# Patient Record
Sex: Male | Born: 1960 | Race: White | Hispanic: No | Marital: Single
Health system: Midwestern US, Academic
[De-identification: ages and names within clinical notes are randomized; demographics above are authoritative.]

## PROBLEM LIST (undated history)

## (undated) DIAGNOSIS — E119 Type 2 diabetes mellitus without complications: Secondary | ICD-10-CM

## (undated) DIAGNOSIS — C801 Malignant (primary) neoplasm, unspecified: Secondary | ICD-10-CM

## (undated) DIAGNOSIS — M2420 Disorder of ligament, unspecified site: Secondary | ICD-10-CM

## (undated) DIAGNOSIS — M629 Disorder of muscle, unspecified: Secondary | ICD-10-CM

## (undated) DIAGNOSIS — N289 Disorder of kidney and ureter, unspecified: Secondary | ICD-10-CM

## (undated) DIAGNOSIS — S8990XA Unspecified injury of unspecified lower leg, initial encounter: Secondary | ICD-10-CM

## (undated) DIAGNOSIS — E785 Hyperlipidemia, unspecified: Secondary | ICD-10-CM

## (undated) DIAGNOSIS — J984 Other disorders of lung: Secondary | ICD-10-CM

## (undated) DIAGNOSIS — T148XXA Other injury of unspecified body region, initial encounter: Secondary | ICD-10-CM

## (undated) DIAGNOSIS — Z87828 Personal history of other (healed) physical injury and trauma: Secondary | ICD-10-CM

## (undated) DIAGNOSIS — I10 Essential (primary) hypertension: Secondary | ICD-10-CM

## (undated) DIAGNOSIS — M259 Joint disorder, unspecified: Secondary | ICD-10-CM

## (undated) HISTORY — PX: PR CHOLECYSTECTOMY: 47600

## (undated) HISTORY — PX: HERNIA REPAIR: SHX5222

## (undated) HISTORY — DX: Other disorders of lung: J98.4

## (undated) HISTORY — DX: Hyperlipidemia, unspecified: E78.5

## (undated) HISTORY — DX: Type 2 diabetes mellitus without complications: E11.9

## (undated) HISTORY — DX: Joint disorder, unspecified: M25.9

## (undated) HISTORY — PX: PR RPR INGUN HERNIA SLIDING ANY AGE: 49525

## (undated) HISTORY — PX: BACK SURGERY: SHX5067

## (undated) HISTORY — DX: Essential (primary) hypertension: I10

## (undated) HISTORY — DX: Other injury of unspecified body region, initial encounter: T14.8XXA

## (undated) HISTORY — DX: Disorder of ligament, unspecified site: M24.20

## (undated) HISTORY — DX: Disorder of kidney and ureter, unspecified: N28.9

## (undated) HISTORY — PX: NO PRIOR SURGERIES: 100

## (undated) HISTORY — DX: Personal history of other (healed) physical injury and trauma: Z87.828

## (undated) HISTORY — DX: Disorder of muscle, unspecified: M62.9

## (undated) HISTORY — DX: Malignant (primary) neoplasm, unspecified: C80.1

## (undated) HISTORY — DX: Unspecified injury of unspecified lower leg, initial encounter: S89.90XA

## (undated) HISTORY — PX: SURGICAL HX OTHER: 99

## (undated) HISTORY — PX: PR UNLISTED PROCEDURE FEMUR/KNEE: 27599

## (undated) HISTORY — PX: HERNIA REPAIR: SHX51

## (undated) HISTORY — PX: FOOT SURGERY: SHX648

## (undated) MED ORDER — LOSARTAN POTASSIUM 50 MG OR TABS
ORAL_TABLET | ORAL | 0 refills | Status: AC
Start: 2023-06-24 — End: ?

## (undated) MED ORDER — NIFEDIPINE ER 30 MG OR TB24
60.0000 mg | EXTENDED_RELEASE_TABLET | Freq: Two times a day (BID) | ORAL | 0 refills | Status: AC
Start: 2023-10-12 — End: ?

## (undated) MED ORDER — METFORMIN HCL 1000 MG OR TABS
1000.0000 mg | ORAL_TABLET | Freq: Two times a day (BID) | ORAL | 0 refills | Status: AC
Start: 2023-06-24 — End: ?

## (undated) MED ORDER — NIFEDIPINE ER 30 MG OR TB24
EXTENDED_RELEASE_TABLET | ORAL | 0 refills | Status: AC
Start: 2023-10-07 — End: ?

## (undated) MED ORDER — METFORMIN HCL 1000 MG OR TABS
ORAL_TABLET | ORAL | 0 refills | Status: AC
Start: 2019-08-10 — End: ?

## (undated) MED ORDER — JARDIANCE 25 MG OR TABS
ORAL_TABLET | ORAL | 0 refills | Status: AC
Start: 2023-06-24 — End: ?

## (undated) MED ORDER — METOPROLOL SUCCINATE ER 25 MG OR TB24
EXTENDED_RELEASE_TABLET | ORAL | 0 refills | Status: AC
Start: 2023-06-24 — End: ?

---

## 1999-06-17 ENCOUNTER — Encounter: Payer: Self-pay | Admitting: Specialist

## 2011-12-14 ENCOUNTER — Encounter (INDEPENDENT_AMBULATORY_CARE_PROVIDER_SITE_OTHER): Payer: Self-pay | Admitting: Family Medicine

## 2011-12-14 ENCOUNTER — Ambulatory Visit (INDEPENDENT_AMBULATORY_CARE_PROVIDER_SITE_OTHER): Payer: No Typology Code available for payment source | Admitting: Family Medicine

## 2011-12-14 VITALS — BP 130/76 | HR 60 | Temp 98.2°F | Resp 14 | Wt 217.0 lb

## 2011-12-14 DIAGNOSIS — E1122 Type 2 diabetes mellitus with diabetic chronic kidney disease: Secondary | ICD-10-CM | POA: Insufficient documentation

## 2011-12-14 DIAGNOSIS — Z794 Long term (current) use of insulin: Secondary | ICD-10-CM | POA: Insufficient documentation

## 2011-12-14 DIAGNOSIS — K4090 Unilateral inguinal hernia, without obstruction or gangrene, not specified as recurrent: Secondary | ICD-10-CM

## 2011-12-14 NOTE — Progress Notes (Signed)
Why is patient here today? Patient is here for groin pain x 2 weeks.   Sharp pain. Has a hernia and painful when he pushes on it.

## 2011-12-14 NOTE — Progress Notes (Signed)
CC: left side groin pain x 2 weeks.  S:  Lifted a suitcase a couple weeks ago.  Can't remember when the pain started, but it did not start immediately.  Does seem to be getting bigger.    He is certain this is a hernia.Marland Kitchen    History of bilateral inguinal hernias repaired.    Past Medical History   Diagnosis Date   . Type II or unspecified type diabetes mellitus without mention of complication, not stated as uncontrolled      Patient Active Problem List   Diagnosis   . Type II or unspecified type diabetes mellitus without mention of complication, not stated as uncontrolled   . UNCLASSIFIED DIABETES, MANAGED ELSEWHERE     History     Social History   . Marital Status: Married     Spouse Name: N/A     Number of Children: N/A   . Years of Education: N/A     Social History Main Topics   . Smoking status: Current Some Day Smoker   . Smokeless tobacco: Not on file    Comment: occasional cigar   . Alcohol Use: No   . Drug Use: No   . Sexually Active: Not on file     Other Topics Concern   . Not on file     Social History Narrative   . No narrative on file         O: BP 130/76  Pulse 60  Temp 98.2 F (36.8 C) (Temporal)  Resp 14  Wt 217 lb (98.431 kg)  Gen: no acute distress  HEENT: conjuc/sclerae clear bilaterally.  OP clear, no erythema or lesions noted.  Neck: no lymphadenopathy, no palpable thyromegaly or nodules  CV: regular rate and rhythm, no murmurs, rubs, or gallops  Resp: CLEAR TO AUSCULTATION BILATERALLY, no wheezes, crackles, or rales  Abd: soft, nontender, nondistended, no palpable masses or hepatosplenomegaly  GU:  Palpable left inguinal hernia on valsalva.  It is fully reducible. Mild tenderness to palpation in the left inguinal regional.  Ext: warm, no edema  Skin: no rashes or concerning lesions noted.  Full skin exam not performed today.  PSYCHIATRIC:  Judgement/insight:  Normal, Mood/affect:  Normal., Orientation:  Normal.    A/P:  550.90 Left inguinal hernia  (primary encounter diagnosis)  Comment:  palpable.  Imaging not ordered today  Plan: REFERRAL TO GENERAL SURGERY            2500 UNCLASSIFIED DIABETES, MANAGED ELSEWHERE  Comment:   Plan: patient does not live her on a permanent basis.  His medications are managed by the [place]

## 2011-12-14 NOTE — Patient Instructions (Addendum)
Please make appointment with surgeon as soon as possible.    If symptoms worsen in the meantime, please call us at once.

## 2012-01-25 ENCOUNTER — Ambulatory Visit (INDEPENDENT_AMBULATORY_CARE_PROVIDER_SITE_OTHER): Payer: No Typology Code available for payment source | Admitting: Family Medicine

## 2012-01-25 ENCOUNTER — Encounter (INDEPENDENT_AMBULATORY_CARE_PROVIDER_SITE_OTHER): Payer: Self-pay | Admitting: Family Medicine

## 2012-01-25 VITALS — BP 145/92 | HR 68 | Temp 97.6°F | Resp 16 | Ht 72.24 in | Wt 214.0 lb

## 2012-01-25 DIAGNOSIS — I10 Essential (primary) hypertension: Secondary | ICD-10-CM

## 2012-01-25 DIAGNOSIS — M546 Pain in thoracic spine: Secondary | ICD-10-CM

## 2012-01-25 DIAGNOSIS — M549 Dorsalgia, unspecified: Secondary | ICD-10-CM

## 2012-01-25 MED ORDER — CYCLOBENZAPRINE HCL 10 MG OR TABS
ORAL_TABLET | ORAL | Status: DC
Start: 2012-01-25 — End: 2014-10-16

## 2012-01-25 NOTE — Progress Notes (Signed)
Why is patient here today? Pt is here and would like to discuss neck and left shoulder pain. Pt states that sx started a couple of week ago.     Any new significant medical diagnoses since last visit? NO    Refills? NO    Referral? NO    Letter or form? NO    Lab results? NO    Health maintenance issues? YES

## 2012-01-25 NOTE — Progress Notes (Signed)
HPI  Sean Oliver is a 51 year old male who presents with complaint of upper back pain on left side that radiates into the left shoulder and upper arm.  This has been a problem intermittently for at least a year.  He works as a Mudlogger.  The team trainers have been able to help him with these symptoms by massage.  He is asking for a massage referral at this time.  He would also like a muscle relaxant.  He denies headache, numbness or tingling in his hands.  He believes the symptoms are stress related.    When asked about his elevated blood pressure, he states he hasn't taken his medication in a few days.    Review of patient's allergies indicates:  No Known Allergies      History   Substance Use Topics   . Smoking status: Current Some Day Smoker   . Smokeless tobacco: Not on file    Comment: occasional cigar   . Alcohol Use: No       ROS:  See HPI      Physical Exam  BP 145/92  Pulse 68  Temp 97.6 F (36.4 C) (Temporal)  Resp 16  Ht 6' 0.24" (1.835 m)  Wt 214 lb (97.07 kg)  BMI 28.83 kg/m2  General:  alert, cooperative, no acute distress   Neck:  No cervical tenderness.  FROM.  Upper back:  Tender along the trapezius on the left.  No deformity noted.  Left shoulder:  FROM, non tender      Assesment/Plan  724.1 Upper back pain on left side  (primary encounter diagnosis)  Plan: Cyclobenzaprine HCl (FLEXERIL) 10 MG Oral Tab,         REFERRAL TO MASSAGE THERAPY        Follow-up if not improved    401.9 HTN (hypertension)  Plan: Encouraged to take medications.

## 2012-01-25 NOTE — Patient Instructions (Signed)
It was a pleasure to see you in clinic today. Your Medical Assistant was: Sara H.           You can schedule an appointment to see us by calling  [phone] or via eCare.     If labs were ordered today the results are expected to be available via eCare 5 days later. Otherwise, result letters are mailed 7-10 days after your tests are completed. If your physician needs to change your care based on your results, you will receive a phone call to notify you. If you haven't heard from him/her and it has been more than 10 days please give us a call.     Thank you for choosing [facility].

## 2012-03-15 ENCOUNTER — Ambulatory Visit (INDEPENDENT_AMBULATORY_CARE_PROVIDER_SITE_OTHER): Payer: No Typology Code available for payment source

## 2012-03-15 DIAGNOSIS — Z23 Encounter for immunization: Secondary | ICD-10-CM

## 2012-03-15 NOTE — Progress Notes (Signed)
Vaccine Screening Questions      1.  Have you had a serious reaction or an allergic reaction to a vaccine?  NO    2.  Currently have a moderate or severe illness, including fever? (Don't Ask if vaccine   ordered by provider same day)  NO    3.  Ever had a seizure or a brain or other nervous system problem syndrome associated with a vaccine? (DTaP/TDaP/DTP pertinent) NO    4.  Is patient receiving  any live vaccinations today? (Varicella-Chickenpox, MMR-Measles/Mumps/Rubella, Zoster-Shingles)  NO    If YES to any of the questions above - Do NOT give vaccine.  Consult with RN or provider in clinic.  (#4 can be YES if all Live vaccine questions are answered NO)    If NO to all questions above - Patient may receive vaccine.    5.  Do you need to receive the Flu vaccine today? YES - Additional Flu Questions  Flu Vaccine Screening Questions:    Ever had a serious allergic reaction to eggs?  NO    Ever had Guillain-Barre syndrome associated with a vaccine? NO    Less than 6 months old? NO    If YES to any of the Flu questions above - NO Flu Vaccine to be given.  Patient may consult provider as needed.    If NO to all questions above - Patient may receive Flu Shot (IM)    If between 6 months and 68 years of age, was flu vaccine received last year?  N/A  If NO to above question:   For the 2012-13 season, administer 2 doses (separated by at least 4 weeks) to children who are receiving influenza vaccine for the first time.    For the 2013-14 season, follow dosing guidelines in the 2013 ACIP influenza vaccine recommendations     Flu Mist (Nasal) may be considered if patient is >2 yrs old and < 42 yrs old.    Do you wish to proceed with screening questions for Flu Mist?  NO     Vaccine information sheet(s) discussed, patient/parent/guardian verbalized understanding? YES     VIS given 03/15/2012 by Glenna Fellows MA.      Immunization Documentation:    Patient/Parent has reviewed the appropriate VIS and has all questions answered?  YES    Vaccine(s) given today without initial adverse effect?     Sean Oliver, [place]

## 2012-03-25 ENCOUNTER — Ambulatory Visit (INDEPENDENT_AMBULATORY_CARE_PROVIDER_SITE_OTHER): Payer: No Typology Code available for payment source

## 2012-08-04 ENCOUNTER — Ambulatory Visit (INDEPENDENT_AMBULATORY_CARE_PROVIDER_SITE_OTHER): Payer: No Typology Code available for payment source | Admitting: Medical

## 2012-08-04 ENCOUNTER — Encounter (INDEPENDENT_AMBULATORY_CARE_PROVIDER_SITE_OTHER): Payer: Self-pay | Admitting: Medical

## 2012-08-04 VITALS — BP 136/82 | HR 56 | Temp 97.8°F | Resp 10 | Wt 210.0 lb

## 2012-08-04 DIAGNOSIS — R079 Chest pain, unspecified: Secondary | ICD-10-CM

## 2012-08-04 LAB — X-RAY CHEST 2 VW

## 2012-08-04 NOTE — Progress Notes (Signed)
S:  Sean Oliver is a 52 year old male who presents today for concerns about chest pain.    Chest pain when he breaths, lays down, and walks for the past 1.5 weeks.  Previously seen at [place] for pericarditis in the winter.    "It feels like before when I had pericarditis, I can feel fluid in my chest."  Pain is 8/10 and is present now  Also had pneumonia last year the same time he was diagnosed with pericarditis.    Has been feeling hot and cold, but no fevers at home.    Admits to shortness of breath, feels like he is breathing more shallow.    Chest pain and shortness of breath is worse when he is walking.    Also feels back pain across the top of his back "the same as last time."    States he doesn't have a PCP or a provider who is regularly managing his diabetes.    He has been seen in the [facility] previously for urgent MSK care but no other outside records are available today.    No prior records from [place] are available through [facility] [employer].    Denies headache.   Denies nausea,vomiting.  Denies abdominal pain.    Admits to pain when he touches his left upper back or sternal area but denies recent trauma.    ROS:  Constitutional: Admits to chills, fatigue, malaise.  Denies fevers  Eyes: Denies changes in vision, eye pain  Nose:  Denies rhinorrhea  Throat:  Denies sore throat, difficulty swallowing.    Cardiovascular: see above  Respiratory: see above Denies cough, wheezing  Gastrointestinal:Denies abdominal pain, nausea, vomiting, diarrhea, constipation  Musculoskeletal: Admits to pain to palpation of left upper back and sternum.  Denies other MSK pain.    Skin: No rashes, lesions, or wounds  Neurological: Denies headaches, tremor, or weakness      Patient Active Problem List   Diagnosis   . Type II or unspecified type diabetes mellitus without mention of complication, not stated as uncontrolled   . UNCLASSIFIED DIABETES, MANAGED ELSEWHERE       Outpatient Prescriptions Prior to Visit   Medication Sig  Dispense Refill   . Cyclobenzaprine HCl (FLEXERIL) 10 MG Oral Tab Take 1 tablet by mouth 3 times per day as needed for muscle pain  30 Tab  1   . Lisinopril 20 MG Oral Tab 1 TABLET DAILY       . MetFORMIN HCl 1000 MG Oral Tab once daily       . Omeprazole 20 MG Oral Tab EC once daily       . Triamterene-HCTZ 75-50 MG Oral Tab once and a half tablets daily         No facility-administered medications prior to visit.     Review of patient's allergies indicates:  No Known Allergies    Family History   Problem Relation Age of Onset   . Osteoporosis Mother    . Hypertension Father      No family status information on file.     OBJECTIVE:  BP 136/82  Pulse 56  Temp(Src) 97.8 F (36.6 C) (Temporal)  Resp 10  Wt 210 lb (95.255 kg)  BMI 28.29 kg/m2  SpO2 99%  GENERAL: Well developed, well nourished patient in no acute distress.  Alert and oriented x3.    HYDRATION:  Moist mucous membranes, good skin turgor.    SKIN:  No rashes or lesions noted.  HEAD:  Normocephalic, atraumatic.    EYES:  PERLA.  EOMI.  No conjuctival erythema or discharge.    NECK:  Supple, nontender.  No lymphadenopathy.    PULM:  Clear to auscultation bilaterally.  No wheezes, rales, or rhonchi.    CV:  RRR.  Nl S1 and S2 without murmurs rubs or gallops.    ABDOMEN:  Soft, nontender.  NABS x4.     MSK:  Generalized tenderness to palpation of left upper back and sternal area.  No ecchymoses or crepitus noted.    NEURO:  CN2-12 intact.  DTRS 2+ bilaterally.  5/5 motor strength bilaterally. Normal gait, stations and coordination.    EXTREMITIES:  No clubbing, cyanosis, or edema.  2+ distal pulses.  Good capillary refill.    Chest xray:  No films available for comparison.  Right atelectasis noted.    EKG:  No Q waves or ST changes.      ASSESSMENT/PLAN:    (786.50) Chest pain  (primary encounter diagnosis)  Chest pain, worse on exertion, in a 52 year old male with a history of poorly controlled diabetes.  Subjectively similar to patient's prior  episode of pericarditis.  We discussed the need for further evaluation today including cardiac enzymes and an echocardiogram and that this evaluation needed to be done in the ER. I recommended that he be transported by ambulance to [place] because this is where he was seen for his prior episode.  He refused ambulance transport and said he would go by private car to the ER.  I called and spoke with the ER physician at [facility].    Plan: ECG ROUTINE ECG W/LEAST 12 LDS TRCG ONLY W/O         I&R, X-RAY CHEST 2 VW, REFERRAL TO ER/URGENT         CARE    Dx, Tx, risks and alternatives discussed with patient and questions answered. Return to clinic if symptoms increase, persist, or worsen.    Face to face consultation regarding HPI, exam, review of differential diagnoses, rationale for decision-making and use of medications including answering all questions and concerns, lasting 35 min with >= 50% of the time spent counseling on above issues.    Note:  During the course of our visit he became angry and expressed frustration that his records from [place] were not available and that we did not have the ability to perform an echocardiogram among other complaints.

## 2014-01-29 ENCOUNTER — Encounter (INDEPENDENT_AMBULATORY_CARE_PROVIDER_SITE_OTHER): Payer: No Typology Code available for payment source | Admitting: Adult Health

## 2014-02-05 ENCOUNTER — Ambulatory Visit (INDEPENDENT_AMBULATORY_CARE_PROVIDER_SITE_OTHER): Payer: No Typology Code available for payment source | Admitting: Family Medicine

## 2014-02-05 ENCOUNTER — Encounter (INDEPENDENT_AMBULATORY_CARE_PROVIDER_SITE_OTHER): Payer: Self-pay | Admitting: Family Medicine

## 2014-02-05 VITALS — BP 127/87 | HR 64 | Temp 98.7°F | Resp 16 | Wt 205.0 lb

## 2014-02-05 DIAGNOSIS — N529 Male erectile dysfunction, unspecified: Secondary | ICD-10-CM

## 2014-02-05 DIAGNOSIS — E119 Type 2 diabetes mellitus without complications: Secondary | ICD-10-CM

## 2014-02-05 LAB — BASIC METABOLIC PANEL
Anion Gap: 5 (ref 4–12)
Calcium: 10 mg/dL (ref 8.9–10.2)
Carbon Dioxide, Total: 31 mEq/L (ref 22–32)
Chloride: 103 mEq/L (ref 98–108)
Creatinine: 1.46 mg/dL — ABNORMAL HIGH (ref 0.51–1.18)
GFR, Calc, African American: >60 mL/min (ref >59)
GFR, Calc, European American: 51 mL/min — ABNORMAL LOW (ref >59)
Glucose: 107 mg/dL (ref 62–125)
Potassium: 3.9 mEq/L (ref 3.6–5.2)
Sodium: 139 mEq/L (ref 135–145)
Urea Nitrogen: 15 mg/dL (ref 8–21)

## 2014-02-05 LAB — LIPID PANEL
Cholesterol (LDL): 86 mg/dL (ref <130)
Cholesterol/HDL Ratio: 5.6
HDL Cholesterol: 29 mg/dL — ABNORMAL LOW (ref >39)
Non-HDL Cholesterol: 134 mg/dL (ref 0–159)
Total Cholesterol: 163 mg/dL (ref <200)
Triglyceride: 241 mg/dL — ABNORMAL HIGH (ref <150)

## 2014-02-05 LAB — PR A1C RAPID, ONSITE: Hemoglobin A1C: 5.8 % (ref 4.0–6.0)

## 2014-02-05 MED ORDER — SILDENAFIL CITRATE 100 MG OR TABS
100.0000 mg | ORAL_TABLET | Freq: Every day | ORAL | Status: DC | PRN
Start: 2014-02-05 — End: 2014-10-16

## 2014-02-05 NOTE — Progress Notes (Signed)
I have personally discussed the case with the resident during or immediately after the patient visit including review of history, physical exam, diagnosis, and treatment plan. 52y with DM, HTN here for refill viagra and reestablish care. Baseline labs for now, needs wellness exam soon.

## 2014-02-05 NOTE — Progress Notes (Signed)
CHIEF COMPLAINT:   Requesting refill of sildenafil    HISTORY OF PRESENT ILLNESS: Sean Oliver is a 53 year old gentleman with history of T2DM presenting to clinic for medication refill. This is my first time seeing this patient and he also reports the following issues:     1. Pain of left hip: About a week ago, felt a sudden pain in his left groin/hip area. Says pain started after stepping on to a curb, describes it "sharp, burning". Pain subsided after 1-2 days, after taking it easy, though he did do some walking around [place].   - Has had similar pain in the past, while playing basketball or while standing up from sitting position. Never had pain like this on the right side.   - Has been taking ibuprofen with adequate relief   - Has had bilateral inguinal hernias, which were repaired with mesh. Says this pain is very different than hernia pain.     2. H/o T2DM:   - Reports history of poorly controlled diabetes, wants to be more proactive in diabetes management   - Leads a physically active lifestyle, as he is a basketball coach, but says he could eat healthier than he does now  - Says he has been seen by a nephrologist in the past for kidney disease, unsure of any further details     3. Erectile dysfunction:   - Requesting medication refill  - Says he has good effect with medication; difficulty without medication  - Denies any episodes of lightheadedness, low blood pressure, chest pain/SOB    4. History of pericarditis:    Has had episodes of pericarditis in the past but says last episode was approximately two years ago. No CP, SOB, palpitations.     SOCIAL HISTORY:   Lives in [place]. Coaches basketball. Smokes cigars 1-2 times a month. Does not smoke cigarettes.     PHYSICAL EXAM:  BP 127/87 mmHg  Pulse 64  Temp(Src) 98.7 F (37.1 C) (Temporal)  Resp 16  Wt 205 lb (92.987 kg)  SpO2 97%  GEN: Pleasant, well-appearing tall African-American gentleman sitting in chair in NAD  HEENT: PERRL, MMM  CVS:  RRR, normal S1/S2, no m/r/g  LUNGS: CTAB wo rales, rhonchi, wheezes  MSK: Full ROM in all extremities     ASSESSMENT/PLAN:   Sean Oliver is a 53 y/o gentleman with h/o T2DM and erectile dysfunction who presents to clinic for medication refill and to establish care with me as his new PCP. Doing well overall, no acute concerns.     - Provide medication refill for sildenafil   - Lipid panel, BMP, HgbA1C today   - Plan to return in next 1-2 months for annual wellness exam and further discussion of diabetes management

## 2014-02-05 NOTE — Patient Instructions (Signed)
Please talk to your wife and sign a release of information form here at the clinic providing the contact information (name, location) of the clinics you have been seen at in the recent years (previous primary care clinic, kidney doctor).    Ask for a release of information form at the front desk.

## 2014-02-08 ENCOUNTER — Telehealth (INDEPENDENT_AMBULATORY_CARE_PROVIDER_SITE_OTHER): Payer: Self-pay | Admitting: Family Medicine

## 2014-02-08 NOTE — Telephone Encounter (Signed)
PA is needed for sidenafil citrate (Viagra) Rx.  Chart notes are incomplete from the 08.24.15 OV.  Dr. Marjo Bicker, please let me know when they are completed.  Reminder, we need good documentation for the reason for the Viagra if you wish to pursue a PA for medication.  Thank you.

## 2014-02-14 NOTE — Telephone Encounter (Signed)
Routing to POD 3

## 2014-02-14 NOTE — Telephone Encounter (Signed)
Pt's wife stopped into clinic today regarding issue below,    Saying that pt was denied Rx from Dr Marjo Bicker, but she applied through pharmaceutical company to obtain medication   -they gave her a packet to fill out, a section of which needs to be filled out by provider, based on last visit with pt   -then when complete dr Marjo Bicker should give a new Rx   -forms placed in manilla envelope, post it with name, N#, and phone of pt included, envelope placed in blue team box, pod3    When forms are complete, please fax to company their fax on form, fax: [phone]    Also call patient / wife when complete their ph: [phone]    KEEP FORMS, even if faxed, patient and wife need them back    Please advise, thanks

## 2014-02-15 NOTE — Telephone Encounter (Signed)
Let spouse know that Dr. Marjo Bicker is not in clinic until 09.14.15.  I will check if another provider can complete the form but did let her know that it may need to wait for Dr. Marjo Bicker.    Form is in Dr. Herma Ard inbox for completion.

## 2014-03-05 NOTE — Progress Notes (Signed)
I have reviewed the resident's documentation and agree with it.

## 2014-03-06 NOTE — Telephone Encounter (Signed)
Completed form and placed in box to be scanned, may be left at the front desk to be handed back to patient. I also spoke with the patient over the phone on Thursday 9/18 regarding his recent test results and let him know that the paperwork is ready and available at the clinic. Thank you.

## 2014-05-07 ENCOUNTER — Telehealth (INDEPENDENT_AMBULATORY_CARE_PROVIDER_SITE_OTHER): Payer: Self-pay | Admitting: Family Medicine

## 2014-05-07 NOTE — Telephone Encounter (Signed)
Called and spoke with Sean Oliver, informed that I went ahead and re-faxed forms to fax number provided and was on form.  Fax confirmation received.  Told her that original form placed in pick up drawer for their records and can pick up whenever

## 2014-05-07 NOTE — Telephone Encounter (Signed)
CONFIRMED PHONE NUMBER: [phone]  CALLERS FIRST AND LAST NAME: [name]  FACILITY NAME: na TITLE: na  CALLERS RELATIONSHIP:Self  RETURN CALL: Detailed message on voicemail only     SUBJECT: General Message   REASON FOR REQUEST: prescription form dropped off on 02/08/14 never got faxed to pharmaceutical company.     MESSAGE: please see TE dated 02/08/14 for more information. Patient's wife would like an update to her request.

## 2014-05-07 NOTE — Telephone Encounter (Signed)
Patient's spouse returning call from [place]. Please contact patient's spouse back back as soon as possible for fax clarifications. Thank you!

## 2014-05-22 ENCOUNTER — Telehealth (INDEPENDENT_AMBULATORY_CARE_PROVIDER_SITE_OTHER): Payer: Self-pay | Admitting: Adult Health

## 2014-05-22 NOTE — Telephone Encounter (Signed)
CONFIRMED PHONE NUMBER: [phone]  CALLERS FIRST AND LAST NAME: Asencion Partridge  FACILITY NAME: na TITLE: na  CALLERS RELATIONSHIP:Spouse  RETURN CALL: General message OK     SUBJECT: Letter/Form Request   REASON FOR REQUEST: Please refer to TE from 05/07/14 regarding forms. Patients wife states that the company still has not received the paperwork and would like the clinic to try a different fax number this time [place]: Other: Please refer to TE from 05/07/14  [facility] SHOULD FILL IT OUT: Other: Please refer to TE from 05/07/14  WHERE SHOULD IT BE SENT: Other: Please refer to TE from 05/07/14  DATE NEEDED BY: Other: Please refer to TE from 05/07/14

## 2014-05-22 NOTE — Telephone Encounter (Signed)
Called Sean Oliver ( Pt's wife ) to inform about successful fax 05/07/2014 and Sean Oliver requested documents be faxed to a provided fax number ( [phone]) which has been done 05/21/2014 (2:11 pm) . Sean Oliver requested a call back when confirmation received. Fax was sent successfully and Sean Oliver received call back to be informed.

## 2014-05-22 NOTE — Telephone Encounter (Signed)
Message forwarded to Pod 3    Spoke with Patient would like to get a call back today.

## 2014-05-29 ENCOUNTER — Telehealth (INDEPENDENT_AMBULATORY_CARE_PROVIDER_SITE_OTHER): Payer: Self-pay | Admitting: Adult Health

## 2014-05-29 NOTE — Telephone Encounter (Signed)
Patient spouse is returning the clinic call, please call [phone] ASAP and a detail VM is ok. Please advise, thank you.

## 2014-05-29 NOTE — Telephone Encounter (Signed)
CONFIRMED PHONE NUMBER: [phone]  CALLERS FIRST AND LAST NAME: [name]  FACILITY NAME: na TITLE: na  CALLERS RELATIONSHIP:Spouse  RETURN CALL: Detailed message on voicemail only     SUBJECT: General Message   REASON FOR REQUEST: Forms    MESSAGE: Patient's spouse is calling regarding forms to be provided to a pharmaceutical company so the patient can receive free medications. Caller states the prescriber section of the form is incomplete. Caller states that in the prescriber section under the prescription order information the provider needs to put the product name, the strength, and the directions. Please contact the caller to advise of the status on the request. Thank you!

## 2014-05-29 NOTE — Telephone Encounter (Signed)
Called and spoke with Sean Oliver, informed missing information filled in.   Faxing to ( [phone])   Fax confirmation received

## 2014-05-29 NOTE — Telephone Encounter (Signed)
Message forwarded to Pod 3      Please see previous closed TE's regarding forms

## 2014-06-22 ENCOUNTER — Telehealth (INDEPENDENT_AMBULATORY_CARE_PROVIDER_SITE_OTHER): Payer: Self-pay | Admitting: Family Medicine

## 2014-06-22 NOTE — Telephone Encounter (Signed)
[place] patient's wife and informed medications were delivered here.  We normally don't keep medications on site, but will do for one-time only which I relayed to patient. She will contact pharmaceutical to mail to either pharmacy or home.    Informed medications are in FD pick up drawer

## 2014-06-22 NOTE — Telephone Encounter (Signed)
CONFIRMED PHONE NUMBER: [phone]  CALLERS FIRST AND LAST NAME: [name]   FACILITY NAME: na TITLE: na  CALLERS RELATIONSHIP:Spouse  RETURN CALL: Detailed message on voicemail only     SUBJECT: General Message   REASON FOR REQUEST: Patient is getting prescriptions sent to clinic from [place] and wants to know what happens to prescriptions.      MESSAGE: Call and assist where medication goes.

## 2014-10-15 ENCOUNTER — Telehealth (INDEPENDENT_AMBULATORY_CARE_PROVIDER_SITE_OTHER): Payer: Self-pay | Admitting: Family Medicine

## 2014-10-15 DIAGNOSIS — N529 Male erectile dysfunction, unspecified: Secondary | ICD-10-CM

## 2014-10-15 NOTE — Telephone Encounter (Signed)
CONFIRMED PHONE NUMBER: [phone]  CALLERS FIRST AND LAST NAME: [name]  FACILITY NAME: naTITLE: na  CALLERS RELATIONSHIP:Spouse  RETURN CALL: Detailed message on voicemail only     ([place]) Texting is an option for this clinic. If you would like [place] to use this option, which mobile phone number should we text to if we are unable to reach you?    SUBJECT: Medication Management   REASON FOR REQUEST: Medication Management    MEDICATION(S): Sildenafil Citrate (VIAGRA) 100 MG Oral Tab  MEDICATION(S)  NEEDED BY: as soon as possible  ADDITIONAL INFORMATION: [employer] is requiring clinic to send prescription to [facility] in order for patient to obtain medication. Patient has less than 2 days left of medication. Please contact [employer] at [phone] ok to speak with anyone regarding this. Please with Asencion Partridge as soon as possible, thank you.

## 2014-10-15 NOTE — Telephone Encounter (Signed)
Routing to Dr Marjo Bicker Covering provider    Last OV- 02/05/14  Last prescribed-02/05/14    Rx and pharmacy pended for approval

## 2014-10-16 ENCOUNTER — Encounter (INDEPENDENT_AMBULATORY_CARE_PROVIDER_SITE_OTHER): Payer: Self-pay | Admitting: Family Medicine

## 2014-10-16 ENCOUNTER — Ambulatory Visit (INDEPENDENT_AMBULATORY_CARE_PROVIDER_SITE_OTHER): Payer: No Typology Code available for payment source | Admitting: Family Medicine

## 2014-10-16 VITALS — BP 138/82 | HR 59 | Temp 97.8°F | Resp 12 | Wt 214.1 lb

## 2014-10-16 DIAGNOSIS — N529 Male erectile dysfunction, unspecified: Secondary | ICD-10-CM

## 2014-10-16 DIAGNOSIS — E119 Type 2 diabetes mellitus without complications: Secondary | ICD-10-CM

## 2014-10-16 DIAGNOSIS — R229 Localized swelling, mass and lump, unspecified: Secondary | ICD-10-CM

## 2014-10-16 MED ORDER — SILDENAFIL CITRATE 100 MG OR TABS
100.0000 mg | ORAL_TABLET | Freq: Every day | ORAL | Status: DC | PRN
Start: 2014-10-16 — End: 2017-05-01

## 2014-10-16 NOTE — Telephone Encounter (Signed)
Handled by Dr. Mariel Sleet in [place].

## 2014-10-16 NOTE — Progress Notes (Signed)
SUBJECTIVE    Sean Oliver is a 54 year old male who presents today with the following issue(s):    Patient is here today following up on multiple medical problems, including skin lesion on his left knee, erectile dysfunction and cough.    He states he has had a skin lesion on his left knee that has been there for more than a year.  He states he was seen before and referred to surgery, but was never contacted.  He has been applying over-the-counter wart remover which seems to shrink it, but it never resolves.  He has had cryotherapy done as well which doesn't seem to work either.  No pain.  It continues to grow.    He lives part of his time in [place].  He is going to be leaving at the end of the month again.    He needs a refill of Viagra sent to his pharmacy.  He has a coupon for this that he gets through the company.    He has also had a mild cough that seems to be improving.  His allergies were acting up and maybe he had a cold.      Patient Active Problem List   Diagnosis   . Type II or unspecified type diabetes mellitus without mention of complication, not stated as uncontrolled   . UNCLASSIFIED DIABETES, MANAGED ELSEWHERE       OBJECTIVE    BP 138/82 mmHg  Pulse 59  Temp(Src) 97.8 F (36.6 C) (Temporal)  Resp 12  Wt 214 lb 2 oz (97.126 kg)  SpO2 100%  GEN:  Alert, pleasant, NAD  PULM:  CTA bilaterally, good effort  CV:  RRR without murmur appreciated, normal PMI  SKIN:  Left knee with 1.5 cm hypertrophic flesh-toned cutaneous horn that projects about 8 mm above the surface of the skin        A/P    (R22.9) Skin nodule  (primary encounter diagnosis)  Plan: This doesn't look like a wart to me.  Concerning for possible skin malignancy.  Dr. Marjo Bicker is out for the month, so will schedule an appointment with me this week for excisional biopsy.    (N52.9) Erectile dysfunction, unspecified erectile dysfunction type  Plan: Sildenafil Citrate (VIAGRA) 100 MG Oral Tab        Refilled.    (E11.9) Type 2 diabetes  mellitus without complication  Plan: D6L RAPID, ONSITE, COMPREHENSIVE METABOLIC         PANEL, LIPID PANEL, URINE SCREEN,         MICROALBUMINURIA        Patient is due for labs, so will get these done at next visit.

## 2014-10-16 NOTE — Progress Notes (Signed)
Reason for Visit: wart L knee    Refills? YES  Referral? NO  Letter or Form? NO  Lab Results? NO    HEALTH MAINTENANCE:  Has the patient had this done since their last visit?  Cervical screening/PAP: N/A  Mammo: N/A  Colon Screen: Will take a FIT test    Have you seen a specialist since your last visit: No    Vaccines Due? No    HM Due:   Health Maintenance   Topic Date Due   . Hepatitis C Screen  Sep 03, 1960   . HIV Screen  10-05-1962   . Tetanus Vaccine  02/26/1975   . Diabetes Foot Exam  02/25/1981   . Diabetes Eye Exam  02/25/1981   . Colon Cancer Screen w/ FOBT/FIT  02/25/2013   . Cholesterol Test  02/06/2019   . Influenza Vaccine  Completed

## 2014-10-18 ENCOUNTER — Other Ambulatory Visit
Admit: 2014-10-18 | Discharge: 2014-10-18 | Disposition: A | Discharge status: Home / Self Care | Payer: No Typology Code available for payment source | Attending: Family Medicine | Admitting: Family Medicine

## 2014-10-18 ENCOUNTER — Ambulatory Visit (INDEPENDENT_AMBULATORY_CARE_PROVIDER_SITE_OTHER): Payer: No Typology Code available for payment source | Admitting: Family Medicine

## 2014-10-18 ENCOUNTER — Encounter (INDEPENDENT_AMBULATORY_CARE_PROVIDER_SITE_OTHER): Payer: Self-pay | Admitting: Family Medicine

## 2014-10-18 VITALS — BP 150/99 | HR 67 | Temp 98.5°F | Resp 20

## 2014-10-18 DIAGNOSIS — C44729 Squamous cell carcinoma of skin of left lower limb, including hip: Secondary | ICD-10-CM

## 2014-10-18 DIAGNOSIS — R229 Localized swelling, mass and lump, unspecified: Secondary | ICD-10-CM

## 2014-10-18 DIAGNOSIS — E119 Type 2 diabetes mellitus without complications: Secondary | ICD-10-CM

## 2014-10-18 LAB — PR A1C RAPID, ONSITE: Hemoglobin A1C: 6.4 % — ABNORMAL HIGH (ref 4.0–6.0)

## 2014-10-18 NOTE — Patient Instructions (Signed)
Suture Care     Sutures (stitches) are used to close wounds. Sutures also help stop bleeding and speed healing. To help your wound heal, follow the tips on this handout.  Keep sutures clean   Avoid doing things that could cause dirt or sweat to get on your sutures. If necessary, cover your sutures with a dressing or bandageto protect them.   Don't pick at scabs. They help protect the wound.   Don't wash the area around your sutures unless your doctor says it's OK. Then, follow his or her instructions for washing and drying.    Keep sutures dry   Keep your sutures out of water.   To keep sutures dry when around water, cover them with a plastic bag. Take the plastic bag off when you are not around water. Youcan also use rubber gloves to protect sutures on a hand.   If sutures get damp, pat them dry.  Changing your dressing  Leave the dressing (bandage) in place until you are told to remove it or change it. Change it only as directed, using clean hands:   After the first ___hours, change your dressing every ___hours.   Change your dressing if it gets wet or soiled.  Other tips   To help wounds on an arm or leg heal, use the limb as little as possible.   To help reduce swelling and throbbing, raise the area with sutures above your heart.   To help prevent itching, cover sutures with gauze. If sutures itch, try not to scratch them.   For pain relief, try acetaminophen or ibuprofen. Don't use aspirin. It can increase bleeding.   [address] [employer] [place], [place] [address]. All rights reserved. This information is not intended as a substitute for professional medical care. Always follow your healthcare professional's instructions.

## 2014-10-18 NOTE — Progress Notes (Signed)
PROCEDURE NOTE, EXCISIONAL BIOPSY    Indication: left leg 1.5 cm hypertrophic flesh-toned cutaneous horn that projects about 8 mm above the surface of the skin    Location of lesion to be excised: as above    Size of the extracted lesion:   As above    History of allergy to iodine: NO     The risks (including bleeding and infection) and benefits of the procedure and written informed consent obtained.     Local anesthesia was performed with  Lidocaine 1% with epinephrine  without added sodium bicarbonate (total 8cc), prepped with povidone iodine, and draped in the usual sterile fashion.  A scalpel was used to excise an elliptical area of skin approximately 3cm by 2cm.  The wound was closed with 3-0 Nylon using simple interrupted stitches and a sterile dressing applied.  The specimen was sent for pathologic examination.     The patient tolerated the procedure well and without apparent complications.     The patient was instructed to keep the wound dry and covered for 24-48h and clean thereafter, and warning signs of infection were reviewed.  Will return for suture removal in 12 days.  Recommended that the patient use OTC acetaminophen and OTC ibuprofen as needed for pain.  Followup sooner for any concerns.                                                                                                                                                        [facility] Physician Documentation    1.  I was present for or participated in the entire procedure, including opening and closing.   YES

## 2014-10-19 LAB — COMPREHENSIVE METABOLIC PANEL
ALT (GPT): 12 U/L (ref 10–48)
AST (GOT): 15 U/L (ref 9–38)
Albumin: 4 g/dL (ref 3.5–5.2)
Alkaline Phosphatase (Total): 63 U/L (ref 39–139)
Anion Gap: 6 (ref 4–12)
Bilirubin (Total): 0.3 mg/dL (ref 0.2–1.3)
Calcium: 10 mg/dL (ref 8.9–10.2)
Carbon Dioxide, Total: 31 meq/L (ref 22–32)
Chloride: 103 meq/L (ref 98–108)
Creatinine: 1.43 mg/dL — ABNORMAL HIGH (ref 0.51–1.18)
GFR, Calc, African American: >60 mL/min (ref >59)
GFR, Calc, European American: 52 mL/min — ABNORMAL LOW (ref >59)
Glucose: 80 mg/dL (ref 62–125)
Potassium: 3.8 meq/L (ref 3.6–5.2)
Protein (Total): 7.1 g/dL (ref 6.0–8.2)
Sodium: 140 meq/L (ref 135–145)
Urea Nitrogen: 20 mg/dL (ref 8–21)

## 2014-10-19 LAB — LIPID PANEL
Cholesterol (LDL): 61 mg/dL (ref <130)
Cholesterol/HDL Ratio: 5.8
HDL Cholesterol: 29 mg/dL — ABNORMAL LOW (ref >39)
Non-HDL Cholesterol: 138 mg/dL (ref 0–159)
Total Cholesterol: 167 mg/dL (ref <200)
Triglyceride: 384 mg/dL — ABNORMAL HIGH (ref <150)

## 2014-10-19 LAB — ALBUMIN/CREATININE RATIO, RANDOM URINE
Albumin (Micro), URN: 27.46 mg/dL
Albumin/Creatinine Ratio, URN: 73 mg/g{creat} — ABNORMAL HIGH (ref <30)
Creatinine/Unit, URN: 377 mg/dL

## 2014-10-23 LAB — PATHOLOGY, SURGICAL

## 2014-10-30 ENCOUNTER — Ambulatory Visit (INDEPENDENT_AMBULATORY_CARE_PROVIDER_SITE_OTHER): Payer: No Typology Code available for payment source | Admitting: Family Medicine

## 2014-10-30 ENCOUNTER — Encounter (INDEPENDENT_AMBULATORY_CARE_PROVIDER_SITE_OTHER): Payer: Self-pay | Admitting: Family Medicine

## 2014-10-30 VITALS — BP 148/105 | HR 90 | Temp 97.4°F | Resp 10 | Wt 213.0 lb

## 2014-10-30 DIAGNOSIS — C44729 Squamous cell carcinoma of skin of left lower limb, including hip: Secondary | ICD-10-CM

## 2014-10-30 NOTE — Progress Notes (Signed)
Reason for Visit: stitches removed left leg    Refills? NO  Referral? NO  Letter or Form? NO  Lab Results? NO    HEALTH MAINTENANCE:  Has the patient had this done since their last visit?  Cervical screening/PAP: N/A  Mammo: N/A  Colon Screen: no    Have you seen a specialist since your last visit: No    Vaccines Due? no    HM Due:   Health Maintenance   Topic Date Due   . Hepatitis C Screen  1960/11/05   . HIV Screen  12-27-62   . Diabetes Foot Exam  02/25/1981   . Diabetes Eye Exam  02/25/1981   . Colon Cancer Screen w/ FOBT/FIT  02/25/2013   . Cholesterol Test  10/18/2019   . Tetanus Vaccine  02/07/2021   . Influenza Vaccine  Completed       PCP Verified?  Yes, Dr Marjo Bicker

## 2014-10-30 NOTE — Progress Notes (Signed)
SUBJECTIVE    Sean Oliver is a 54 year old male who presents today with the following issue(s):    Patient is here today for follow-up on excisional biopsy of left thigh nodule.  It is healing well.      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  Given information from Up To Date on non-melanoma skin cancer.  He will return for wide margin excision.

## 2014-10-31 NOTE — Addendum Note (Signed)
Addended by: Richardean Canal CID on: 10/31/2014 11:26 AM     Modules accepted: Orders

## 2014-11-08 ENCOUNTER — Other Ambulatory Visit
Admit: 2014-11-08 | Discharge: 2014-11-08 | Disposition: A | Discharge status: Home / Self Care | Payer: No Typology Code available for payment source | Attending: Family Medicine | Admitting: Family Medicine

## 2014-11-08 ENCOUNTER — Ambulatory Visit (INDEPENDENT_AMBULATORY_CARE_PROVIDER_SITE_OTHER): Payer: No Typology Code available for payment source | Admitting: Family Medicine

## 2014-11-08 ENCOUNTER — Encounter (INDEPENDENT_AMBULATORY_CARE_PROVIDER_SITE_OTHER): Payer: Self-pay | Admitting: Family Medicine

## 2014-11-08 VITALS — BP 143/98 | HR 54 | Temp 97.5°F | Resp 16 | Wt 219.0 lb

## 2014-11-08 DIAGNOSIS — C44729 Squamous cell carcinoma of skin of left lower limb, including hip: Secondary | ICD-10-CM

## 2014-11-08 MED ORDER — HYDROCODONE-ACETAMINOPHEN 5-325 MG OR TABS
1.0000 | ORAL_TABLET | ORAL | Status: DC | PRN
Start: 2014-11-08 — End: 2017-04-07

## 2014-11-08 MED ORDER — HYDROCODONE-ACETAMINOPHEN 5-325 MG OR TABS
1.0000 | ORAL_TABLET | ORAL | Status: DC | PRN
Start: 2014-11-08 — End: 2014-11-08

## 2014-11-08 NOTE — Progress Notes (Signed)
PROCEDURE NOTE, EXCISIONAL BIOPSY    Indication: wide margin excision for SCC    Location of lesion to be excised: left knee    Size of the extracted lesion:  1.5 cm    History of allergy to iodine: NO     The risks (including bleeding and infection) and benefits of the procedure and written informed consent obtained.     Local anesthesia was performed with  Lidocaine 1% with epinephrine (total 4cc), prepped with povidone iodine, and draped in the usual sterile fashion.  A scalpel was used to excise an elliptical area of skin approximately 4cm by 1cm.  The wound was closed with 3-0 Nylon using simple interrupted stitches and a sterile dressing applied.  The specimen was sent for pathologic examination.     The patient tolerated the procedure well and without apparent complications.     The patient was instructed to keep the wound dry and covered for 24-48h and clean thereafter, and warning signs of infection were reviewed.  Will return for suture removal in 10 days.  Recommended that the patient use Vicodin as needed for pain.  Followup sooner for any concerns.                                                                                                                                                        [facility] Physician Documentation    1.  I was present for or participated in the entire procedure, including opening and closing.   YES

## 2014-11-08 NOTE — Patient Instructions (Signed)
Suture Care  Stitches (sutures) are used to close wounds. Sutures also help stop bleeding and speed healing. To help your wound heal, follow the tips on this handout.  Some sutures need to be removed by a healthcare provider. Others dissolve on their own. Sometimes strips of tape are used. You'll be told what kind of sutures you have.   Keep sutures clean   Avoid doing things that could cause dirt or sweat to get on your sutures. If necessary, cover your sutures with a dressing or bandageto protect them.   Don't pick at scabs. They help protect the wound.   Don't wash the area around your sutures unless your doctor says it's OK. Then, follow his or her instructions for washing and drying.  Keep sutures dry   Keep your sutures out of water.   Take a sponge bath to avoid getting your sutures wound wet, unless your healthcare provider tells you otherwise.   Ask your provider when can you take a shower or bathe.   Ask your provider about the best way to keep your sutures dry when bathing or showering.   If sutures get damp, pat them dry.  Changing your dressing  Leave the dressing (bandage) in place until you are told to remove it or change it. Change it only as directed, using clean hands:   After the first ___hours, change your dressing every ___hours.   Change your dressing if it gets wet or soiled.  Other tips   To help wounds on an arm or leg heal, use the limb as little as possible.   To help reduce swelling and throbbing, raise the area with sutures above your heart.   To help prevent itching, cover sutures with gauze. If sutures itch, try not to scratch them.   For pain relief, try acetaminophen or ibuprofen. Don't use aspirin. It can increase bleeding.  When to seek medical care  Call your healthcare provider if you notice any of the following signs:   Increased soreness, pain, or tenderness after 24 hours   A red streak, increased redness, or puffiness near the wound   White, yellowish,  or bad smelling discharge from the wound   Bleeding that can't be stopped by applying pressure   Steri-Strips fall off or stitches dissolve before the wound heals   Fever over 100.1F (37.8C)    2000-2015 [employer] [address], [place] [address]. All rights reserved. This information is not intended as a substitute for professional medical care. Always follow your healthcare professional's instructions.

## 2014-11-13 LAB — PATHOLOGY, SURGICAL

## 2014-11-20 ENCOUNTER — Ambulatory Visit (INDEPENDENT_AMBULATORY_CARE_PROVIDER_SITE_OTHER): Payer: No Typology Code available for payment source

## 2014-11-20 ENCOUNTER — Telehealth (INDEPENDENT_AMBULATORY_CARE_PROVIDER_SITE_OTHER): Payer: Self-pay | Admitting: Family Medicine

## 2014-11-20 DIAGNOSIS — C44729 Squamous cell carcinoma of skin of left lower limb, including hip: Secondary | ICD-10-CM

## 2014-11-20 NOTE — Telephone Encounter (Signed)
Please let the patient know that the repeat excision was completely normal.  I have referred him to Dermatology to establish care for ongoing skin screening within the next few months.  No further treatment is needed at this time.  Thanks.

## 2014-11-20 NOTE — Telephone Encounter (Signed)
Patient stopped by the FD after his suture removal. He would like a call today to discuss the results from 5/26 with Dr. Mariel Sleet.     Routing to Pod 3.   Thank you!

## 2014-11-20 NOTE — Progress Notes (Signed)
Patient is here today with [facility] clinical staff for suture removal on  L knee.  Removed 6 stitches with no complications, Pt tolerates well with no reactions.  Pt also would like to know the pathology results sent by Dr. Mariel Sleet. Will send a TE to Dr. Mariel Sleet for response.

## 2014-11-20 NOTE — Telephone Encounter (Signed)
Routing to Dr Mariel Sleet- Please advise on Pathology result from 5/26. (I did not see a current letter or e-care)

## 2014-11-21 ENCOUNTER — Telehealth (INDEPENDENT_AMBULATORY_CARE_PROVIDER_SITE_OTHER): Payer: Self-pay | Admitting: Family Medicine

## 2014-11-21 NOTE — Telephone Encounter (Signed)
Called patient and relayed message below per Dr. Mariel Sleet. No further questions.

## 2014-11-21 NOTE — Telephone Encounter (Signed)
Spoke with patient. He states he told his spouse he already had a conversation with the clinic. No further action needed. See 6/7 TE

## 2014-11-21 NOTE — Telephone Encounter (Signed)
CONFIRMED PHONE NUMBER: [phone]  CALLERS FIRST AND LAST NAME: [name]  FACILITY NAME: N/A TITLE: N/A  CALLERS RELATIONSHIP: Spouse  RETURN CALL: General message OK    SUBJECT: Results Request   REASON FOR REQUEST: Results Request    WHO ORDERED THE TEST: Lenard Simmer, MD  TYPE OF TEST? Pathology  WHERE WAS THE TEST DONE: [facility]  WHEN WAS THE TEST DONE: 11/08/14

## 2015-04-02 ENCOUNTER — Ambulatory Visit: Payer: No Typology Code available for payment source | Attending: Dermatology | Admitting: Dermatology

## 2015-04-02 ENCOUNTER — Telehealth (HOSPITAL_BASED_OUTPATIENT_CLINIC_OR_DEPARTMENT_OTHER): Payer: Self-pay | Admitting: Dermatology

## 2015-04-02 DIAGNOSIS — Z85828 Personal history of other malignant neoplasm of skin: Secondary | ICD-10-CM | POA: Insufficient documentation

## 2015-04-02 DIAGNOSIS — D485 Neoplasm of uncertain behavior of skin: Secondary | ICD-10-CM | POA: Insufficient documentation

## 2015-04-02 DIAGNOSIS — L821 Other seborrheic keratosis: Secondary | ICD-10-CM | POA: Insufficient documentation

## 2015-04-02 MED ORDER — LIDOCAINE 0.5% AND EPINEPHRINE 1:200,000 (4.5 ML) + SODIUM BICARBONATE 8.4% (0.5 ML) COMPOUNDED IJ SOLN
0.5000 mL | INJECTION | Freq: Once | INTRADERMAL | Status: AC
Start: 2015-04-02 — End: 2015-04-02
  Administered 2015-04-02: 1 mL via INTRADERMAL

## 2015-04-02 NOTE — Patient Instructions (Addendum)
BIOPSY CARE INSTRUCTIONS        1. Keep dressing DRY and in place for 24 hrs. Change to smaller dressing daily and monitor site. Biopsy sites in thick hair are left open. Use care in combing, brushing, or shaving at these sites.    2. Gently cleanse area with dilute soapy water.    3. Use Vaseline or Aquaphor, avoid topical antibiotics.    4. Shower or bath should be kept brief, i.e. No more than 5-15 min. Avoid prolonged water immersions.     5. Observe for development of bleeding. Limited flat bruising will gradually be absorbed. Contact the clinic if extensive bruising or a lump develops.    6. If active bleeding occurs, apply firm pressure for 10-15 min.    7. Call the clinic during weekdays or the paging operator after hours at [phone] and ask to speak to "dermatology on call" if the following occurs:   Persistent bleeding   Spreading redness   Increasing pain   Warm to the touch   Purulent (yellow or green) drainage   Fever   Nausea/vomiting    8. If the sutures are present, they are usually removed in the clinic on approximately one week (head/neck) or two weeks (trunk/extremities).    9. You will be advised of the results by mail, phone or during a clinic follow-up visit. If you have not received notification within 3 weeks, please call the clinic and leave a message for clinic staff.    How to do a self skin examination:  (adapted from: http://www.skincancer.org/skin-cancer-information/early-detection/step-by-step-self-examination)    1. Examine your face, especially the nose, lips, mouth, and ears (both sides of your ears). Use one or both mirrors to get a clear view.    2. Thoroughly inspect your scalp, using a blow dryer and mirror to expose each section to view. Get a friend or family member to help, if you can.    3. Check your hands carefully: palms and backs, between the fingers and under the fingernails. Continue up the wrists to examine both front and back of your forearms.    4.  Standing in front of the full-length mirror, begin at the elbows and scan all sides of your upper arms. Don't forget the underarms.    5. Next focus on the neck, chest, and torso. Women should lift breasts to view the underside.    6. With your back to the full-length mirror, use the hand mirror to inspect the back of your neck, shoulders, upper back, and any part of the back of your upper arms you could not view in step 4.    7. Still using both mirrors, scan your lower back, buttocks, and backs of both legs.    8. Sit down; prop each leg in turn on the other stool or chair. Use the hand mirror to examine the genitals. Check front and sides of both legs, thigh to shin, ankles, tops of feet, between toes and under toenails. Examine soles of feet and heels.      Please be on the look out for any suspicious lesions.  These include growths or moles that are growing, changing or looking different than other growths or moles on the body. Also pay attention to any growths that itch, bleed, or don't heal. Consider taking digital photographs of any mole or growth that you want to monitor over time.  Call your provider if you have any questions or find a concerning growth or mole.      More information about skin cancer and what to look for can be found at:   https://www.aad.org/public/spot-skin-cancer    Sunscreen recommendations:    Unscented sunscreen lotion:  - Pure Sun Defense SPF 50 lotion  - La Roche-Posay Anthelios 60 melt in sunscreen milk lotion (does not leave an oily sheen to skin)  - No-Ad Sport SPF 50 lotion (non-greasy, no white streaks, low price)  - Coppertone Water Babies SPF 50 (fragrance free, oil free)    Scented lotions:  - Equate Ultra Protection SPF 50 (at [employer])  - Ocean Potion Protect and Nourish SPF 30  - Aveeno protect + hydrate SPF 30    Sprays:  - Banana Boat SunComfort Continuous Spray SPF 50+ (has a slight coconut scent)  - Trader Joe's Spray SPF 50+  - Equate Sport Continuous Spray SPF 50+  (found at [employer], does not leave a film on your skin)  - Neutrogena Beach Defense Water + sun protection SPF 70    Sticks:  - Coppertone Kids stick SPF 55  - Up & Up Kids Stick SPF 55 ([employer])  - Neutrogena Beach Defense Water + Sun Protection Stick SPF 50+    Information based on Consumer Reports testing 2016.

## 2015-04-02 NOTE — Telephone Encounter (Signed)
(  TEXTING IS AN OPTION FOR [facility] CL[facility] ONLY)  Is this a [facility] clinic? No      RETURN CALL: Detailed message on voicemail only      SUBJECT:  General Message     REASON FOR REQUEST: patient running 10-15 minutes late. Call if not able to see.     MESSAGE: na

## 2015-04-02 NOTE — Progress Notes (Signed)
Chief Complaint   Patient presents with   . Skin Cancer Concern     FSE         HISTORY OF PRESENT ILLNESS:   Sean Oliver is a 54 year old male with a recent history of squamous cell carcinoma on the left anterior thigh who was referred by Dr. Lenard Simmer for continued cutaneous surveillance for new or recurrent skin cancer.  Today he  presents for evaluation of a new black spot     . Location - back   . Duration -  Less than 1 month   . Associated symptoms -felt like something stuck to back, no pain, no bleeding   . Modifying factors - no treatments.  He cannot identify anything that makes it better or worse.    Regarding the squamous cell carcinoma on the left anterior thigh: This lesion had been present for over a year.  He thought it was a wart.  He would treated with wart remover, it would go down and then it would recur.  Excision was performed and showed   A) Skin, left leg, excisional biopsy:   1. Invasive squamous cell carcinoma with   keratoacanthomatous features and adjacent viral   changes; margins free in sections examined.   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     COMMENT:   The inflammatory changes are not entirely specific but   may reflect a reaction to topical medication or other   chronic eczematous processes.    This was subsequently reexcised with margins negative for squamous cell carcinoma.      Since the surgery, he has had pain intermittently at the excision site.  He rates the pain 6 out of 10.  This occurs for only a few minutes and then resolves.  It does not happen every day.  It's hard to predict when it will occur.    No other personal history of skin cancer.  No family hx of skin cancer.  Spent a lot of time in the sun. Sunscreen is not worn on a regular basis.  Regular self-skin exams are not performed.      REVIEW OF SYSTEMS:  Systems reviewed include constitutional, eyes, ears, nose, throat, respiratory, cardiovascular,  gastrointestinal, genitourinary, musculoskeletal, neurologic, psychiatric, dermatologic and are all negative except as in changes, chest pain with breathing, trouble swallowing, prolonged or frequent diarrhea and as noted in the history of present illness.      ALLERGIES:   Allergies as of 04/02/2015   . (No Known Allergies)        MEDICATIONS:   Outpatient Prescriptions Marked as Taking for the 04/02/15 encounter (Office Visit) with Ellwood Dense, MD   Medication Sig Dispense Refill   . Atorvastatin Calcium 40 MG Oral Tab      . Lisinopril 20 MG Oral Tab 1 TABLET DAILY     . MetFORMIN HCl 1000 MG Oral Tab once daily     . Triamterene-HCTZ 75-50 MG Oral Tab once and a half tablets daily         RELEVANT PAST MEDICAL HISTORY:   Past Medical History   Diagnosis Date   . Type II or unspecified type diabetes mellitus without mention of complication, not stated as uncontrolled    . Hypertension     has a history of diverticulitis requiring surgery.    FAMILY HISTORY:   Family History   Problem Relation Age of Onset   . Osteoporosis Mother    .  Hypertension Father        SOCIAL HISTORY: Passive smoke exposure, rare alcohol use, denies drug use.  Works as a Biochemist, clinical.  Grew up in [place].  Travels to many places for work including [place], [place], [place] and [place].  Lives with his wife and kids.  No pets.      PHYSICAL EXAMINATION:  General: He is a well appearing adult male in no acute distress.  Normal development, and nutrition.  Normal affect and mood, without barriers to understanding.    Skin: exam performed included scalp, hair, face, neck, chest, abdomen, back, arms (right, left), forearms (right, left), thighs (right, left), legs (right, left), hands (right, left), feet (right, left), fingernails, toenails, buttocks.  Exam showed: Fitzpatrick type V skin.  -- 7x45mm dark brown scaly papule on the right back  -- multiple brown stuck on papules on the chest and back c/w SKs, skin tags in the axillae.   Dermatosis papulosis nigra papules on the cheeks.  -- well healed scar on the left lower anterior thigh by knee with no evidence of SCC recurrence  -- Owens Shark symmetric firm papule on the right upper thigh consistent with a dermatofibroma.    ASSESSMENT and PLAN:  1. Neoplasm of uncertain behavior of skin on the right back.  Differential diagnosis includes inflamed seborrheic keratosis versus squamous cell carcinoma in situ.  Biopsy was performed.    2. History of squamous cell carcinoma of skin  No evidence of recurrence on examination.  Given that there were viral changes noted in the biopsy, this squamous cell carcinoma likely was a result of HPV rather than sun.  Despite this, I did recommend that he practice sun protection.  I also advised him to do monthly self skin examinations.  Instructions were provided.    3. Seborrheic keratosis  Reassurance.  These are noninflamed [place] no treatment is required.    The risks and benefits of these recommendations, as well as other treatment options were discussed with the patient today. Questions were answered.    Return visit 6 months, but he  was instructed to call us should new symptoms or problems arise.  Shave Biopsy    Indication: Neoplasm of uncertain behavior  Site(s): Right back   Prep: Wiped with isopropyl alcohol  Anesthesia: 0.5% lidocaine with epinephrine 1:200,000 and sodium bicarb    Description of procedure:    The risk of scarring, bleeding, infection, pain, nerve damage and the benefit of obtaining a potential diagnosis were explained to the patient. The patient agreed to the procedure after being informed of the risks and benefits. Signed consent on file. After prepping the skin and with anesthesia, a shave biopsy tool was used to sample the area to the mid dermis. Hemostasis was achieved with aluminum chloride. The specimen was observed to be in the container correctly labeled with the patient's name and sent to the lab. Sterile dressing applied over white  petrolatum. There were no complications and the patient tolerated the procedure well. Patient was given wound care instructions.

## 2015-04-02 NOTE — Progress Notes (Signed)
What is the reason for your visit? FSE     Do you have a history of skin cancer? YES     Immediate family history of melanoma? NO    If our office needs to contact you after your visit today, is it ok to leave a detailed message on your phone? YES    What is the preferred number? [phone]      Correct Patient using  2 PATIENT IDENTIFIERS YES   Correct SIDE marked YES   Correct SITE marked YES   Correct PROCEDURE YES   Correct EQUIPMENT and EQUIPMENT SETTINGS YES   Completed CONSENT YES, Date: 04/02/2015

## 2015-04-04 LAB — PATHOLOGY, SURGICAL

## 2015-04-10 ENCOUNTER — Telehealth (HOSPITAL_BASED_OUTPATIENT_CLINIC_OR_DEPARTMENT_OTHER): Payer: Self-pay | Admitting: Dermatology

## 2015-04-10 NOTE — Progress Notes (Signed)
Quick Note:    Benign lesion. No further treatment needed.  ______

## 2015-04-10 NOTE — Telephone Encounter (Signed)
Biopsy showed SK. No further treatment needed. Sean Oliver contacted with results.  Stated he had trouble with bleeding at the site for a few days (took ibuprofen).  Now the bleeding has stopped.  No symptoms at the site.   Told him to contact me if he has any further questions or concerns about the biopsy site.     Plan to see him back in clinic in 6 months for hx of SCC.  If all looks ok, can likely monitor once a year.

## 2015-08-26 NOTE — Progress Notes (Signed)
Past dermatologic history  1. 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 Mariel Sleet    2. Biopsy 03/2015 right back: Pigmented and clonal seborrheic keratosis    Chief Complaint   Patient presents with   . Skin Problem     6 momnth f/u          HISTORY OF PRESENT ILLNESS:   Sean Oliver is a 55 year old male with a history of SCC, here for continued cutaneous surveillance for new or recurrent skin cancer. LV 04/12/2015.  States he is still having a little bit of short-lived pain intermittently at the excision site.  It has been getting better since his last visit.  It does not happen every day.    No other lesions that are growing, changing, itching or bleeding.  He does have a little bit of scaling on his feet.  This is been present for a long time.  No treatments.    Biopsy site on the right back has healed up well.  No symptoms at the site.  Sunscreen is worn sometimes in the summer time.  Regular self-skin exams are not performed.      REVIEW OF SYSTEMS:  Feels well, no fevers or chills.  No abnormal weight loss, no decrease in appetite.  No easy bruising or bleeding.No other itching, bleeding, or changing skin lesions currently. No other skin lesions of concern.      ALLERGIES:   Allergies as of 08/27/2015   . (No Known Allergies)        MEDICATIONS:   Outpatient Prescriptions Marked as Taking for the 08/27/15 encounter (Office Visit) with Ellwood Dense, MD   Medication Sig Dispense Refill   . Atorvastatin Calcium 40 MG Oral Tab      . Hydrocodone-Acetaminophen 5-325 MG Oral Tab Take 1-2 tablets by mouth every 4 hours as needed for pain. For pain. Not to exceed 8 tablets per day. 3 tablet 0   . Lisinopril 20 MG Oral Tab 1 TABLET DAILY     . MetFORMIN HCl 1000 MG Oral Tab once daily     . Omeprazole 20 MG Oral Tab EC once daily     . Triamterene-HCTZ 75-50 MG Oral Tab once and a half tablets daily          RELEVANT PAST MEDICAL HISTORY: Denies changes in past medical history since last visit.    SOCIAL HISTORY: Married.  Lives with his wife and kids.    PHYSICAL EXAMINATION:  General: He is a well appearing african [place] adult male in no acute distress.  Normal development, and nutrition.  Normal affect and mood, without barriers to understanding.    Skin: exam performed included scalp, hair, face, neck, chest, abdomen, back, arms (right, left), forearms (right, left), thighs (right, left), legs (right, left), hands (right, left), feet (right, left), fingernails, toenails, buttocks.  Exam showed:  -- Scattered brown stuck on papules on the trunk and extremities consistent with seborrheic keratoses.  He has dermatosis papulosis nigra papules on the cheeks.    -- He has a well-healed scar on the left lower anterior thigh by the knee with no evidence of squamous cell carcinoma recurrence.  -- Fine scaling in a moccasin distribution on the bilateral feet.  He has yellowing and thickening of multiple toenails.  Rest of skin examination is unremarkable.  Lymph nodes: No inguinal lymphadenopathy.  ASSESSMENT and PLAN:  1. History of squamous cell carcinoma of skin  No evidence of recurrence.  Discussed concerning changes to watch for.  Recommended that he check his inguinal basins for lymphadenopathy.  Sun protective practices were reviewed and recommended including sunscreen use, sun protective clothing and seeking shade.    2. Tinea pedis of both feet  - Ketoconazole 2 % External Cream; For feet. Apply to affected area twice daily for 2 weeks then as needed.  Dispense: 30 g; Refill: 3  - Discussed pursuing culture of the nails to confirm onychomycosis, however he is not interested in systemic therapy for onychomycosis at this time.    3. Seborrheic keratosis  Reassurance that these lesions on his trunk and face are benign.      The risks and benefits of these recommendations, as well as other treatment  options were discussed with the patient today. Questions were answered.    Return visit 9-12 months, but he  was instructed to call us should new symptoms or problems arise.

## 2015-08-27 ENCOUNTER — Encounter (HOSPITAL_BASED_OUTPATIENT_CLINIC_OR_DEPARTMENT_OTHER): Payer: Self-pay | Admitting: Dermatology

## 2015-08-27 ENCOUNTER — Ambulatory Visit: Payer: No Typology Code available for payment source | Attending: Dermatology | Admitting: Dermatology

## 2015-08-27 DIAGNOSIS — Z85828 Personal history of other malignant neoplasm of skin: Secondary | ICD-10-CM | POA: Insufficient documentation

## 2015-08-27 DIAGNOSIS — L821 Other seborrheic keratosis: Secondary | ICD-10-CM

## 2015-08-27 DIAGNOSIS — B353 Tinea pedis: Secondary | ICD-10-CM | POA: Insufficient documentation

## 2015-08-27 MED ORDER — KETOCONAZOLE 2 % EX CREA
TOPICAL_CREAM | CUTANEOUS | Status: DC
Start: 2015-08-27 — End: 2017-04-07

## 2015-08-27 NOTE — Progress Notes (Signed)
If our office needs to contact you after your visit today, is it ok to leave a detailed message on your phone? YES    What is the preferred number? [phone]

## 2015-08-27 NOTE — Patient Instructions (Signed)
Ketoconazole cream twice a day to feet for foot fungus (tinea) use until clear.

## 2015-10-04 ENCOUNTER — Ambulatory Visit (INDEPENDENT_AMBULATORY_CARE_PROVIDER_SITE_OTHER): Payer: No Typology Code available for payment source | Admitting: Family Medicine

## 2015-10-04 ENCOUNTER — Encounter (INDEPENDENT_AMBULATORY_CARE_PROVIDER_SITE_OTHER): Payer: Self-pay | Admitting: Family Medicine

## 2015-10-04 ENCOUNTER — Ambulatory Visit (INDEPENDENT_AMBULATORY_CARE_PROVIDER_SITE_OTHER): Payer: Self-pay | Admitting: Family Medicine

## 2015-10-04 VITALS — BP 136/92 | HR 64 | Temp 97.6°F | Resp 15 | Wt 217.0 lb

## 2015-10-04 DIAGNOSIS — L309 Dermatitis, unspecified: Secondary | ICD-10-CM

## 2015-10-04 MED ORDER — TRIAMCINOLONE ACETONIDE 0.5 % EX CREA
TOPICAL_CREAM | CUTANEOUS | Status: DC
Start: 2015-10-04 — End: 2017-04-19

## 2015-10-04 NOTE — Progress Notes (Signed)
Sean Oliver is a 55 year old male patient who presents for evaluation of   1.  Rash-over the past week or so patient is noted a large macular rash with mild itching on his right flank.  Since then it has persisted but not spread.  Patient denies any associated vesicles and no other associated symptoms.    Past Medical History   Diagnosis Date   . Type II or unspecified type diabetes mellitus without mention of complication, not stated as uncontrolled ([place])    . Hypertension        Social History   Substance Use Topics   . Smoking status: Current Some Day Smoker   . Smokeless tobacco: Never Used      Comment: occasional cigar   . Alcohol Use: No      Comment: Rarely        Family History   Problem Relation Age of Onset   . Osteoporosis Mother    . Hypertension Father        Review of patient's allergies indicates:  No Known Allergies    OBJECTIVE:  BP 136/92 mmHg  Pulse 64  Temp(Src) 97.6 F (36.4 C) (Temporal)  Resp 15  Wt 217 lb (98.431 kg)    Exam of the right flank shows a 3 x 4 pale macular rash consistent with nummular eczema.    Assessment & Plan  (L30.9) Eczema, unspecified type  (primary encounter diagnosis)  Plan: Triamcinolone Acetonide 0.5 % External Cream  Reassured patient that this is a common and benign rash.  Will begin on TAC 0.5% cream to the affected area.  Also recommended using moisturizing lotion after showering each morning.  Follow-up with his PCP for diabetes.      Dx, Tx, risks and alternatives discussed with patient and questions answered. Return to clinic if symptoms increase, persist, or worsen.    Face to face consultation included HPI, exam, review of differential diagnoses, rationale for decision-making and use of medications including answering all questions and concerns

## 2015-10-04 NOTE — Telephone Encounter (Signed)
Protocol: RASH OR REDNESS - LOCALIZED-ADULT-[place]  Negative: Sounds like a life-threatening emergency to the triager  Negative: Possible contact with poison ivy or oak  Negative: Insect bite(s) suspected  Negative: Athlete's Foot suspected (i.e., itchy rash between the toes)  Negative: Jock Itch suspected (i.e., itchy rash on inner thighs near genital area)  Negative: Wound infection suspected (i.e., pain, spreading redness, or pus; in a cut, puncture, scrape or sutured wound)  Negative: Rash of external male genital area (vulva)  Negative: Rash of penis or scrotum  Negative: Small spot, skin growth, or mole  Negative: Fever and localized purple or blood-colored spots or dots that are not from injury or friction  Negative: Fever and localized rash is very painful  Negative: Patient sounds very sick or weak to the triager  Negative: Looks like a boil, infected sore, deep ulcer, or other infected rash (spreading redness, pus)  Negative: Painful rash with multiple small blisters grouped together (i.e., dermatomal distribution or 'band' or 'stripe')  Negative: Localized rash is very painful (no fever)  Negative: Localized purple or blood-colored spots or dots that are not from injury or friction (no fever)  Negative: Lyme disease suspected (e.g., bull's-eye rash or tick bite / exposure)  Negative: Patient wants to be seen  Negative: Tender bumps in armpits  Negative: Pimples (localized) and no improvement after using Care Advice  Affirmative: SEVERE local itching persists after 2 days of steroid cream and antihistamines  Disposition of See Today or Tomorrow in Office  suggested.

## 2015-10-04 NOTE — Telephone Encounter (Signed)
"  i've had this rash on my Right side now for over 2 months" "it's can be purplish or pink in color" "Over the last 2 days it's become very itchy" "I have tried antihistamines and steroid creams and it isn't helping"

## 2015-10-04 NOTE — Telephone Encounter (Signed)
Pt states that he has a  Rash on his side for the past two months.

## 2015-11-08 ENCOUNTER — Ambulatory Visit: Payer: Self-pay

## 2015-11-08 ENCOUNTER — Emergency Department
Admission: EM | Admit: 2015-11-08 | Discharge: 2015-11-08 | Disposition: A | Discharge status: Home / Self Care | Payer: No Typology Code available for payment source | Attending: Emergency Medicine | Admitting: Emergency Medicine

## 2015-11-08 ENCOUNTER — Telehealth (INDEPENDENT_AMBULATORY_CARE_PROVIDER_SITE_OTHER): Payer: Self-pay | Admitting: Family Medicine

## 2015-11-08 DIAGNOSIS — R208 Other disturbances of skin sensation: Secondary | ICD-10-CM

## 2015-11-08 DIAGNOSIS — H53149 Visual discomfort, unspecified: Secondary | ICD-10-CM

## 2015-11-08 DIAGNOSIS — R42 Dizziness and giddiness: Secondary | ICD-10-CM

## 2015-11-08 DIAGNOSIS — E119 Type 2 diabetes mellitus without complications: Secondary | ICD-10-CM | POA: Insufficient documentation

## 2015-11-08 DIAGNOSIS — R51 Headache: Secondary | ICD-10-CM

## 2015-11-08 NOTE — Telephone Encounter (Signed)
(  TEXTING IS AN OPTION FOR [facility] ONLY)  Is this a [place] clinic? Yes. Patient declined the option to receive mobile text messages.      RETURN CALL: General message OK      SUBJECT:  General Message     REASON FOR REQUEST: Patient going to Emergency Room    MESSAGE: Patient called with severe headache sudden onset and constant for 3 days. Declined to call 911 but spoke with CCL and CCL advised patient to go to ER. Patient agreed and is driving himself to ER.

## 2015-11-08 NOTE — Telephone Encounter (Signed)
Pt checked in at ED.  Routing FYI to Dr. Marjo Bicker

## 2015-11-08 NOTE — Telephone Encounter (Signed)
[facility] pt   C/o severe HA onset 3 days ago   Started out as mild, progressed to severe   No relief x 3 days, L sided severe eye and temple pain   Some dizziness         Reason for Disposition  . Severe pain in one eye    Protocols used: HEADACHE-ADULT-[place]

## 2015-11-14 ENCOUNTER — Encounter (INDEPENDENT_AMBULATORY_CARE_PROVIDER_SITE_OTHER): Payer: No Typology Code available for payment source | Admitting: Family Medicine

## 2016-02-07 ENCOUNTER — Ambulatory Visit: Payer: No Typology Code available for payment source | Attending: Physician Assistant

## 2016-02-07 DIAGNOSIS — R05 Cough: Secondary | ICD-10-CM | POA: Insufficient documentation

## 2016-03-03 ENCOUNTER — Encounter (INDEPENDENT_AMBULATORY_CARE_PROVIDER_SITE_OTHER): Payer: Self-pay | Admitting: Internal Medicine

## 2016-03-03 ENCOUNTER — Ambulatory Visit (INDEPENDENT_AMBULATORY_CARE_PROVIDER_SITE_OTHER): Payer: No Typology Code available for payment source | Admitting: Internal Medicine

## 2016-03-03 VITALS — BP 134/90 | HR 74 | Ht 74.0 in | Wt 217.0 lb

## 2016-03-03 DIAGNOSIS — F1210 Cannabis abuse, uncomplicated: Secondary | ICD-10-CM | POA: Insufficient documentation

## 2016-03-03 DIAGNOSIS — R911 Solitary pulmonary nodule: Secondary | ICD-10-CM

## 2016-03-03 DIAGNOSIS — R590 Localized enlarged lymph nodes: Secondary | ICD-10-CM | POA: Insufficient documentation

## 2016-03-03 DIAGNOSIS — Z6827 Body mass index (BMI) 27.0-27.9, adult: Secondary | ICD-10-CM

## 2016-03-03 NOTE — Progress Notes (Signed)
IDENTIFICATION/CHIEF COMPLAINT  55 year old male here for New Patient    Abnormal chest imaging, cough     HISTORY OF PRESENT ILLNESS    Willilam Oliver is a 55 year old man, current marijuana smoker, with PMH of diabetes and skin cancer, who presents for evaluation of abnormal CT chest. He is referred by Sean Ladd PA-C at [facility].     Unfortunately the CT scan done Sean Oliver had done was not available in our PACS for review, and this is a somewhat truncated visit.     Sean Oliver reports that he has had a cough for about a year. The cough is productive of white or brown sputum. Cough is upper chest centered. He has not had fever, chills hemoptysis or weight loss. He does not find the cough too bothersome. He smokes marijuana on a daily basis 1-2 joints per day. He is a never cigarette smoker. He reports having had multiple bouts of pneumonia or bronchitis in the past.     Cough: daily   Sputum: Scant / copious / color: thick pasty white, clear, brown   Hemoptysis: no   Cough emenates from his upper   Smoker: marijuana 1-2, joint, ounce every 2 days   No cigarette smoking at all   Wheeze: no   Sinus congestion or post nasal drip:  No   GERD/ heartburn: yes   Aspiration / swallowing difficulties: yes, feels that food is getting stuck in this throat, upper chest   Chest pain: not currently, he has a history of pericarditis  Which has been quiescent since 2014   Dyspnea: At rest / with exertion: no   Lower extremity edema: no   Orthopnea: no   Weight loss - he reports losing 100 pounds in 6 months several years ago, weight has been stable since them   Fevers, night sweats: no     Social history details:   Married 3 children. 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    PROBLEM LIST  Patient Active Problem List    Diagnosis Date Noted   . Marijuana abuse [F12.10] 03/03/2016   . Mediastinal lymphadenopathy [R59.0] 03/03/2016   . Pulmonary nodule, right [R91.1] 03/03/2016   . History of squamous cell carcinoma of skin [Z85.828] 04/02/2015   . UNCLASSIFIED DIABETES, MANAGED ELSEWHERE 12/14/2011   . Type II or unspecified type diabetes mellitus without mention of complication, not stated as uncontrolled ([place]) [E11.9]      Well controlled per patient.  He gets his meds from [place].  Has regular eye exams.  Normal per patient.         MEDICATIONS  Outpatient Medications Prior to Visit   Medication Sig Dispense Refill   . Atorvastatin Calcium 40 MG Oral Tab      . Hydrocodone-Acetaminophen 5-325 MG Oral Tab Take 1-2 tablets by mouth every 4 hours as needed for pain. For pain. Not to exceed 8 tablets per  day. 3 tablet 0   . Ketoconazole 2 % External Cream For feet. Apply to affected area twice daily for 2 weeks then as needed. 30 g 3   . Lisinopril 20 MG Oral Tab 1 TABLET DAILY     . MetFORMIN HCl 1000 MG Oral Tab once daily     . Omeprazole 20 MG Oral Tab EC once daily     . Sildenafil Citrate (VIAGRA) 100 MG Oral Tab Take 1 tablet (100 mg) by mouth daily as needed for erectile dysfunction. Do not take with other blood pressure lowering medications 30 tablet 3   . Triamcinolone Acetonide 0.5 % External Cream Apple to rash on trunk 2-3x/day 30 g 1   . Triamterene-HCTZ 75-50 MG Oral Tab once and a half tablets daily       No facility-administered medications prior to visit.        Review of patient's allergies indicates:  No Known Allergies    Social History     Social History   . Marital status: Married     Spouse name: N/A   . Number of children: N/A   . Years of  education: N/A     Social History Main Topics   . Smoking status: Current Some Day Smoker   . Smokeless tobacco: Never Used      Comment: occasional cigar   . Alcohol use No      Comment: Rarely    . Drug use: No   . Sexual activity: Yes     Partners: Female      Comment: Married 20 yrs      Other Topics Concern   . Not on file     Social History Narrative   . No narrative on file       Family History   Problem Relation Age of Onset   . Osteoporosis Mother    . Hypertension Father    No pulmonary history     Immunization History   Administered Date(s) Administered   . Tdap vaccine 02/08/2011   . influenza vaccine trivalent PF 03/15/2012       PHYSICAL EXAM  BP 134/90   Pulse 74   Ht 6\' 2"  (1.88 m)   Wt 217 lb (98.4 kg)   SpO2 97% Comment: room air  BMI 27.86 kg/m   General: overweight well appearing man who is conversant and not in respiratory distress  Head: Normocephalic.   Eyes: Conjunctivae/corneas clear  Nose: No mucosal lesions or polyps   Oropharynx: Posterior pharynx without erythema or drainage, class 2 airway   Neck: supple. No cervical, axillary or supraclavicular adenopathy. JVD not elevated.  Lungs: clear to auscultation, normal expiratory phase, No wheezing, rhonchi or rales.  Heart: normal rate, regular rhythm, no murmurs   Abd:  soft, non-tender.   Musculoskeletal:  Normal bulk and tone.   Skin: No rashes   Extremities: without cyanosis, clubbing, edema.     LABORATORY  Reviewed.    DIAGNOSTIC IMAGING    CXR 02/07/16 Ill defined 2 cm nodule in the right lung base         CT chest   Images not available for review. Patient brought in a report which notes subcarinal and R hilar lymphadenoapthy which is partially calcified. 2 cm mulitlobulatd RLL nodule which is also partially calcified     PFT:  None     IMPRESSION  This is a 55 year old man with pulmonary nodule and mediastinal lymphadenopathy.  PLAN  - The calcified mediastinal adenopathy makes granulomatous disease, including sarcoidosis,  prior histoplasmosis (though no mention of splenic calcifications) or tuberculosis more likely. It may be worthwhile pursuing a bronchoscopy with endobronchial ultrasound guided transbronchial lymph node aspiration to exclude malignancy or lymphoma.   - Counseled the patient that he will need to bring in CD with images to review before we can formulate a plan.   - The patient was counseled to reduce marijuana use, as this is likely contributing to chronic cough.   - Will suggest a GI referral for upper endoscopy to evaluate for esophageal stricture or GERD given his chronic cough and swallowing difficulty.     (R91.1) Pulmonary nodule  (primary encounter diagnosis)  Plan: CT CHEST W/O CONTRAST    (R91.1) Pulmonary nodule, right    (R59.0) Mediastinal lymphadenopathy    Total time of approximately 30 minutes was spent face-to-face with the patient, of which more than 50% was spent counseling New Patient    as outlined in this note and the patient instruction portion of the after visit summary.    Hilarie Fredrickson, MD   [facility]     Addendum   I personally reviewed OSH CT chest from 02/26/2016 and 02/27/2011. These demonstrate a stable mutlilobulated right lower lobe pulmonary nodule and improved subcarinal and R hilar adenopathy. Calcification was not present in the mediastinal LNs in 2012. Overall the imaging is favored to be benign. Recommend surveillance of the pulmonary nodule with repeat CT chest in 6 months.     I called Mr. Vukelich to discuss this, no answer and left VM asking him to call the clinic.

## 2016-03-06 ENCOUNTER — Telehealth (INDEPENDENT_AMBULATORY_CARE_PROVIDER_SITE_OTHER): Payer: Self-pay | Admitting: Internal Medicine

## 2016-03-06 NOTE — Telephone Encounter (Signed)
Please review

## 2016-03-06 NOTE — Telephone Encounter (Signed)
Daeveon regarding CT scan results. Please call to discuss.

## 2016-03-09 ENCOUNTER — Telehealth (INDEPENDENT_AMBULATORY_CARE_PROVIDER_SITE_OTHER): Payer: Self-pay | Admitting: Internal Medicine

## 2016-03-09 NOTE — Telephone Encounter (Signed)
Patient returned your call from last week. Patient also stated that he needs to get a swallow test done. I did not see any orders for this test. Patient would like to talk with you today if possible.

## 2016-03-09 NOTE — Progress Notes (Signed)
Recall set for patient to follow up 6 months with CT prior

## 2016-03-10 ENCOUNTER — Other Ambulatory Visit (INDEPENDENT_AMBULATORY_CARE_PROVIDER_SITE_OTHER): Payer: Self-pay | Admitting: Internal Medicine

## 2016-03-10 ENCOUNTER — Ambulatory Visit: Payer: No Typology Code available for payment source | Attending: Gastroenterology

## 2016-03-10 DIAGNOSIS — R1310 Dysphagia, unspecified: Secondary | ICD-10-CM | POA: Insufficient documentation

## 2016-03-10 DIAGNOSIS — K219 Gastro-esophageal reflux disease without esophagitis: Secondary | ICD-10-CM | POA: Insufficient documentation

## 2016-03-10 NOTE — Progress Notes (Signed)
Called Sean Oliver to review CT scan results. Stable subcarinal / R hilar LAD and RLL nodule (all heavily calcified)   Encouraged him to reduce marijuana use have a GI evaluation to investigate cough. He is going to see GI at [facility] today  Follow up in 6 months with CT scan

## 2016-03-11 NOTE — Progress Notes (Signed)
Recall placed in EPIC for return in 6 months

## 2016-04-09 ENCOUNTER — Ambulatory Visit
Admission: RE | Admit: 2016-04-09 | Discharge: 2016-04-09 | Disposition: A | Discharge status: Home / Self Care | Payer: No Typology Code available for payment source | Attending: Gastroenterology | Admitting: Gastroenterology

## 2016-04-09 ENCOUNTER — Other Ambulatory Visit (HOSPITAL_BASED_OUTPATIENT_CLINIC_OR_DEPARTMENT_OTHER): Payer: Self-pay | Admitting: Gastroenterology

## 2016-04-09 DIAGNOSIS — M549 Dorsalgia, unspecified: Secondary | ICD-10-CM | POA: Insufficient documentation

## 2016-04-09 DIAGNOSIS — E119 Type 2 diabetes mellitus without complications: Secondary | ICD-10-CM | POA: Insufficient documentation

## 2016-04-09 DIAGNOSIS — M25569 Pain in unspecified knee: Secondary | ICD-10-CM | POA: Insufficient documentation

## 2016-04-09 DIAGNOSIS — G8929 Other chronic pain: Secondary | ICD-10-CM | POA: Insufficient documentation

## 2016-04-09 DIAGNOSIS — R1310 Dysphagia, unspecified: Secondary | ICD-10-CM | POA: Insufficient documentation

## 2016-04-09 DIAGNOSIS — K2940 Chronic atrophic gastritis without bleeding: Secondary | ICD-10-CM

## 2016-04-09 DIAGNOSIS — K2970 Gastritis, unspecified, without bleeding: Secondary | ICD-10-CM | POA: Insufficient documentation

## 2016-04-09 DIAGNOSIS — K219 Gastro-esophageal reflux disease without esophagitis: Secondary | ICD-10-CM | POA: Insufficient documentation

## 2016-04-09 DIAGNOSIS — K259 Gastric ulcer, unspecified as acute or chronic, without hemorrhage or perforation: Secondary | ICD-10-CM

## 2016-04-09 DIAGNOSIS — Z79899 Other long term (current) drug therapy: Secondary | ICD-10-CM | POA: Insufficient documentation

## 2016-04-09 DIAGNOSIS — I10 Essential (primary) hypertension: Secondary | ICD-10-CM | POA: Insufficient documentation

## 2016-04-09 LAB — CBC, DIFF
% Basophils: 0 %
% Eosinophils: 3 %
% Immature Granulocytes: 0 %
% Lymphocytes: 32 %
% Monocytes: 8 %
% Neutrophils: 57 %
% Nucleated RBC: 0 %
Absolute Eosinophil Count: 0.21 10*3/uL (ref 0.00–0.50)
Absolute Lymphocyte Count: 2.22 10*3/uL (ref 1.00–4.80)
Basophils: 0.03 10*3/uL (ref 0.00–0.20)
Hematocrit: 41 % (ref 38–50)
Hemoglobin: 13.4 g/dL (ref 13.0–18.0)
Immature Granulocytes: 0.02 10*3/uL (ref 0.00–0.05)
MCH: 28.7 pg (ref 27.3–33.6)
MCHC: 33 g/dL (ref 32.2–36.5)
MCV: 87 fL (ref 81–98)
Monocytes: 0.55 10*3/uL (ref 0.00–0.80)
Neutrophils: 3.99 10*3/uL (ref 1.80–7.00)
Nucleated RBC: 0 10*3/uL
Platelet Count: 265 10*3/uL (ref 150–400)
RBC: 4.67 10*6/uL (ref 4.40–5.60)
RDW-CV: 15.1 % — ABNORMAL HIGH (ref 11.6–14.4)
WBC: 7.02 10*3/uL (ref 4.3–10.0)

## 2016-04-09 LAB — COMPREHENSIVE METABOLIC PANEL
ALT (GPT): 21 U/L (ref 10–48)
AST (GOT): 16 U/L (ref 9–38)
Albumin: 3.6 g/dL (ref 3.5–5.2)
Alkaline Phosphatase (Total): 72 U/L (ref 39–139)
Anion Gap: 4 (ref 4–12)
Bilirubin (Total): 0.5 mg/dL (ref 0.2–1.3)
Calcium: 9 mg/dL (ref 8.9–10.2)
Carbon Dioxide, Total: 28 meq/L (ref 22–32)
Chloride: 108 meq/L (ref 98–108)
Creatinine: 1.37 mg/dL — ABNORMAL HIGH (ref 0.51–1.18)
GFR, Calc, African American: >60 mL/min/{1.73_m2} (ref >59)
GFR, Calc, European American: 54 mL/min/{1.73_m2} — ABNORMAL LOW (ref >59)
Glucose: 140 mg/dL — ABNORMAL HIGH (ref 62–125)
Potassium: 3.6 meq/L (ref 3.6–5.2)
Protein (Total): 6.3 g/dL (ref 6.0–8.2)
Sodium: 140 meq/L (ref 135–145)
Urea Nitrogen: 15 mg/dL (ref 8–21)

## 2016-04-10 LAB — PATHOLOGY, SURGICAL

## 2016-07-15 ENCOUNTER — Ambulatory Visit: Payer: No Typology Code available for payment source | Attending: Dermatology | Admitting: Dermatology

## 2016-07-15 DIAGNOSIS — L820 Inflamed seborrheic keratosis: Secondary | ICD-10-CM | POA: Insufficient documentation

## 2016-07-15 DIAGNOSIS — Z85828 Personal history of other malignant neoplasm of skin: Secondary | ICD-10-CM

## 2016-07-15 DIAGNOSIS — L821 Other seborrheic keratosis: Secondary | ICD-10-CM | POA: Insufficient documentation

## 2016-07-15 NOTE — Patient Instructions (Signed)
CRYOSURGERY (LIQUID NITROGEN TREATMENT)        The technique:  We use this method to kill of precancerous cells      in lesions such as actinic keratoses, superficial    squamous cell cancers, basal cell carcinomas or  benign growths such as seborrheic keratoses and  warts.    This treatment often leads to complete   Resolution; however, they can recur.      What to expect:  Mild burning pain is normal during and following the procedure  (up to one hour). Local swelling and redness is normal and lasts for  a few days. Rarely, a blister can form at the site of the freezing.  Discharge is not uncommon. A crust may then form over the  lesion and then fall off. Complete healing usually occurs after 7-  14 days although the area may remain red for longer.      After care:  Wash the treated areas daily with soap and water. Pat dry, then   cover with a thin coat of Vaseline or Aquaphor. A band-aid is   optional. Most importantly, no sun exposure during healing!  Either avoid sun exposure or cover with a band aid! After one  week, cover treated areas with sunscreen. If the original lesion  recurs, please return to me for further evaluation!

## 2016-07-15 NOTE — Progress Notes (Signed)
[facility] [place]  l  [place]  [address]  TEL: [phone]  l  FAX: [phone]      07/15/2016    PRIMARY CARE PROVIDER:  Lelan Pons, MD  [id number]  [place] [address]    PATIENT: Sean Oliver    [id number]    IDENTIFICATION/CHIEF CONCERN:    Sean Oliver is a 56 year old male return patient seen today for lesion on the right upper chest.    Chief Complaint   Patient presents with   . 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 He was instructed to use the cream twice a day for 2 weeks, then as needed.    Since the last visit he reports of a lesion on the chest that flaked off. It is sore.   He also reports of a lesion on the inside of his lips. It felt like a "ball ". He believes it is gone now but notes it was present for a long time.    ROS:  Gen - feels well, No fevers/chills/recent illnesses/unintended weight loss  Skin - No other skin complaints  Heme/lymph - No new lumps or bumps under the skin  Respiratory - Yes cough, SOB, or CP but he is being treated by his ENT doctor.    SOCIAL HISTORY:  Married.  Lives with his wife and kids. He reports that he has been smoking.  He has never used smokeless tobacco. He reports that he does not drink alcohol or use drugs.    PHYSICAL EXAMINATION:  Vital Signs: There were no vitals taken for this visit.  General: He is a well appearing adult male in no acute distress.  Normal development, and nutrition.  Normal affect and mood, without barriers to understanding.  Lymph: No left inguinal lymphadenopathy   Skin: Exam performed included scalp, hair, face,  eyelids, lips, neck, chest, abdomen, back, bilateral arms, hands, fingernails, bilateral legs, feet, toenails, groin, genital skin, and buttocks.  Exam was negative except for:    Right chest - dark brown stuck-on papule c/w inflamed seborrheic keratosis   Cheeks and chest - scattered brown papules and pedunculated papules c/w DPN/seborrheic keratosis and skin tags  Oral mucosa- the lesion that he described is no longer present    IMPRESSION/PLAN:  The following was discussed with the patient.  Recommendations are as follows:    1. Inflamed seborrheic keratosis   - DESTRUCTION BENIGN LESIONS UP TO 14  2. Seborrheic keratosis  These are benign lesions. They can be treated with cryotherapy (liquid nitrogen) if irritated or inflamed.    - Offered cryotherapy today, patient accepts; see procedure notes below.     3. History of squamous cell carcinoma of skin  No evidence of recurrence. Sun protective practices were reviewed and recommended including sunscreen use, sun protective clothing and seeking shade.    Return to clinic in 12 months or sooner PRN acute concerns.    Cryotherapy, benign    Diagnosis: Inflamed seborrheic keratosis  Site(s): right chest  Number of lesions: 1  Paring: No    The risk of scarring, blistering, pain, atrophy, dyspigmentation and the potential benefit of eradicating the lesion were explained to the patient. The patient agreed to the procedure after being informed of the risks and benefits. Liquid nitrogen was applied to the area resulting in a thaw time of 10-12 seconds, with 2 cycles to each lesion. There were no complications and the patient tolerated the procedure well.    07/15/2016 @ 4:05 PM - I, Kathryne Eriksson, [employer], acted as a Education administrator and documented the service/procedure performed to the best of my knowledge in the presence of Karlyne Greenspan, MD who will provide the final review and authentication.  Signed: Drue Second, Karlyne Greenspan, MD, personally performed  the services described in this documentation, as scribed by Clarnce Flock in my presence, and it is both accurate and complete.

## 2016-07-15 NOTE — Progress Notes (Signed)
Pt here for FSE, last seen March 2017.  Check lesion right upper chest.  Ph [phone], not on ecare

## 2016-07-18 NOTE — Addendum Note (Signed)
Addended by: Ellwood Dense on: 07/18/2016 09:48 AM     Modules accepted: Level of Service

## 2017-03-04 ENCOUNTER — Encounter (INDEPENDENT_AMBULATORY_CARE_PROVIDER_SITE_OTHER): Payer: Self-pay | Admitting: Family Medicine

## 2017-03-04 ENCOUNTER — Ambulatory Visit (INDEPENDENT_AMBULATORY_CARE_PROVIDER_SITE_OTHER): Payer: No Typology Code available for payment source | Admitting: Family Medicine

## 2017-03-04 VITALS — BP 192/126 | HR 54 | Temp 97.5°F | Resp 16 | Wt 204.0 lb

## 2017-03-04 DIAGNOSIS — Z6826 Body mass index (BMI) 26.0-26.9, adult: Secondary | ICD-10-CM

## 2017-03-04 DIAGNOSIS — M7581 Other shoulder lesions, right shoulder: Secondary | ICD-10-CM

## 2017-03-04 DIAGNOSIS — S7001XA Contusion of right hip, initial encounter: Secondary | ICD-10-CM

## 2017-03-04 DIAGNOSIS — J40 Bronchitis, not specified as acute or chronic: Secondary | ICD-10-CM

## 2017-03-04 DIAGNOSIS — I10 Essential (primary) hypertension: Secondary | ICD-10-CM

## 2017-03-04 DIAGNOSIS — E119 Type 2 diabetes mellitus without complications: Secondary | ICD-10-CM

## 2017-03-04 MED ORDER — AZITHROMYCIN 250 MG OR TABS
ORAL_TABLET | ORAL | 0 refills | Status: DC
Start: 2017-03-04 — End: 2017-04-07

## 2017-03-04 NOTE — Progress Notes (Signed)
Reason for Visit:   Chief Complaint   Patient presents with   . Cough     productive cough x 1 month, fluid in the chest   . Blood Pressure     follow up - high BP           Refills? NO  Referral? NO  Letter or Form? NO  Lab Results? NO    HEALTH MAINTENANCE:  Has the patient had this done since their last visit?  Cervical screening/PAP: N/A  Mammo: N/A  Colon Screen: Due    Have you seen a specialist since your last visit: No    Vaccines Due? No    HM Due:   Health Maintenance   Topic Date Due   . Hepatitis C Screening  10[birth year]   . HIV Screening  02/25/1978   . Diabetes Foot Exam  02/25/1981   . Diabetes Eye Exam  02/25/1981   . Colorectal Cancer Screening (FOBT/FIT)  02/25/2013   . Zoster Vaccine (1 of 2) 02/25/2013   . Diabetes A1c  04/20/2015   . Depression Screening (PHQ-2)  10/30/2015   . Influenza Vaccine (1) 03/15/2017   . Lipid Disorders Screening  10/18/2019   . Tetanus Vaccine  02/07/2021       PCP Verified?  Yes, Lelan Pons, MD
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
## 2017-03-05 NOTE — Progress Notes (Deleted)
{  BLANK AO:130865::"[facility]","[place]"} FAMILY MEDICINE OUTPATIENT VISIT    {interpneed:106510}  Type of Visit: {NEWVSESTAB:101723} Problem Visit  Primary Care Provider:  Lelan Pons, MD    Sean Oliver is a 56 year old male, with the below problem list, who presents to discuss the following:    Chief Complaint  ***    HPI/Interval History  ***  Diabetes:    Lipids:    Review of Systems    I personally reviewed and confirmed the {history review:109953} in the record with the patient today.    Objective  There were no vitals taken for this visit.  Physical Exam    {results}    Assessment and Plan   Sean Oliver is a 56 year old male with the below diagnoses. I discussed the following with Sean Oliver:    {Diagnoses - Do not select until diagnoses are accurate in the chart:109383}    {FOLLOW UP NGEX:528413}    Lelan Pons, MD  [place]  [place] [address]  [phone]    Appendix:  Active Ambulatory Problems     Diagnosis Date Noted   . Type II or unspecified type diabetes mellitus without mention of complication, not stated as uncontrolled ([place])    . History of squamous cell carcinoma of skin 04/02/2015   . Marijuana abuse 03/03/2016   . Mediastinal lymphadenopathy 03/03/2016   . Pulmonary nodule, right 03/03/2016     Resolved Ambulatory Problems     Diagnosis Date Noted   . No Resolved Ambulatory Problems     Past Medical History:   Diagnosis Date   . Hypertension    . Type II or unspecified type diabetes mellitus without mention of complication, not stated as uncontrolled ([place])        Patient's Medications   New Prescriptions    No medications on file   Previous Medications    ATORVASTATIN CALCIUM 40 MG ORAL TAB        AZITHROMYCIN (ZITHROMAX) 250 MG ORAL TAB    Take 2 tablets today, then take 1 tablet every day until gone.    FLUTICASONE PROPIONATE 50 MCG/ACT NASAL SUSPENSION        HYDROCODONE-ACETAMINOPHEN 5-325 MG ORAL TAB    Take  1-2 tablets by mouth every 4 hours as needed for pain. For pain. Not to exceed 8 tablets per day.    KETOCONAZOLE 2 % EXTERNAL CREAM    For feet. Apply to affected area twice daily for 2 weeks then as needed.    LISINOPRIL 20 MG ORAL TAB    1 TABLET DAILY    METFORMIN HCL 1000 MG ORAL TAB    once daily    OMEPRAZOLE 20 MG ORAL TAB EC    once daily    SILDENAFIL CITRATE (VIAGRA) 100 MG ORAL TAB    Take 1 tablet (100 mg) by mouth daily as needed for erectile dysfunction. Do not take with other blood pressure lowering medications    TRIAMCINOLONE ACETONIDE 0.5 % EXTERNAL CREAM    Apple to rash on trunk 2-3x/day    TRIAMTERENE-HCTZ 75-50 MG ORAL TAB    once and a half tablets daily   Modified Medications    No medications on file   Discontinued Medications    No medications on file

## 2017-03-08 ENCOUNTER — Encounter (INDEPENDENT_AMBULATORY_CARE_PROVIDER_SITE_OTHER): Payer: No Typology Code available for payment source | Admitting: Family Medicine

## 2017-03-19 NOTE — Progress Notes (Signed)
FAMILY MEDICINE OUTPATIENT VISIT      Type of Visit:  Problem Visit  Primary Care Provider:  Lelan Pons, MD    Sean Oliver is a 56 year old male, with the below problem list, who presents to discuss the following:    Chief Complaint  ***    HPI/Interval History  History of squamous cell carcinoma. Sees derm yearly. Rcently had SKs cryo'd.   Recently saw Dr. Mariel Oliver for bronchitis. Was given z pack. Also has likely rotator cuff tendonitis, plan for PT.     HTN: what meds?    HX of Diabetes:    Lipids:    Outside records reviewed:  Sean Oliver, Sean Oliver: pericarditis in past  [place] soarian: Normal CT head 2017, esophagram 2017: minimal dysmotility, otherwise normal. No reflux.   Labs: Cr 2017 1.37, +microalb, low HDL, high trig, 2016 A1c 6.4    Med review    Pulmonology 2017:   - The calcified mediastinal adenopathy makes granulomatous disease, including sarcoidosis, prior histoplasmosis (though no mention of splenic calcifications) or tuberculosis more likely. It may be worthwhile pursuing a bronchoscopy with endobronchial ultrasound guided transbronchial lymph node aspiration to exclude malignancy or lymphoma.   - Counseled the patient that he will need to bring in CD with images to review before we can formulate a plan.   - The patient was counseled to reduce marijuana use, as this is likely contributing to chronic cough.   - Will suggest a GI referral for upper endoscopy to evaluate for esophageal stricture or GERD given his chronic cough and swallowing difficulty.     EGD 2017:    [facility]    Review of Systems    I personally reviewed and confirmed the  in the record with the patient today.    Objective  There were no vitals taken for this visit.  Physical Exam        Assessment and Plan   Sean Oliver is a 56 year old male with the below diagnoses. I discussed the following with Sean Oliver:            Sean Pons, MD  [facility]  [place]  [address]  [phone]    Appendix:  Active Ambulatory Problems     Diagnosis Date Noted   . Type II or unspecified type diabetes mellitus without mention of complication, not stated as uncontrolled    . History of squamous cell carcinoma of skin 04/02/2015   . Marijuana abuse 03/03/2016   . Mediastinal lymphadenopathy 03/03/2016   . Pulmonary nodule, right 03/03/2016     Resolved Ambulatory Problems     Diagnosis Date Noted   . No Resolved Ambulatory Problems     Past Medical History:   Diagnosis Date   . Hypertension    . Type II or unspecified type diabetes mellitus without mention of complication, not stated as uncontrolled ([place])        Patient's Medications   New Prescriptions    No medications on file   Previous Medications    ATORVASTATIN CALCIUM 40 MG ORAL TAB        AZITHROMYCIN (ZITHROMAX) 250 MG ORAL TAB    Take 2 tablets today, then take 1 tablet every day until gone.    FLUTICASONE PROPIONATE 50 MCG/ACT NASAL SUSPENSION        HYDROCODONE-ACETAMINOPHEN 5-325 MG ORAL TAB    Take 1-2 tablets by mouth every 4 hours as  needed for pain. For pain. Not to exceed 8 tablets per day.    KETOCONAZOLE 2 % EXTERNAL CREAM    For feet. Apply to affected area twice daily for 2 weeks then as needed.    LISINOPRIL 20 MG ORAL TAB    1 TABLET DAILY    METFORMIN HCL 1000 MG ORAL TAB    once daily    OMEPRAZOLE 20 MG ORAL TAB EC    once daily    SILDENAFIL CITRATE (VIAGRA) 100 MG ORAL TAB    Take 1 tablet (100 mg) by mouth daily as needed for erectile dysfunction. Do not take with other blood pressure lowering medications    TRIAMCINOLONE ACETONIDE 0.5 % EXTERNAL CREAM    Apple to rash on trunk 2-3x/day    TRIAMTERENE-HCTZ 75-50 MG ORAL TAB    once and a half tablets daily   Modified Medications    No medications on file   Discontinued Medications    No medications on file

## 2017-03-22 ENCOUNTER — Ambulatory Visit (INDEPENDENT_AMBULATORY_CARE_PROVIDER_SITE_OTHER): Payer: No Typology Code available for payment source | Admitting: Family Medicine

## 2017-03-30 ENCOUNTER — Telehealth (INDEPENDENT_AMBULATORY_CARE_PROVIDER_SITE_OTHER): Payer: Self-pay | Admitting: Family Medicine

## 2017-03-30 NOTE — Telephone Encounter (Signed)
Central Outreach Team outreach for Care Gap closure:  Outreach to patient to notify due for care gap: Diabetes Care - A1C, Diabetes Care - BP control, Diabetes Care - Eye exam .  Commercial payer: [place]  No answer; voicemail left for patient. Briefly explained reason for call. Encouraged patient to call back the Central Outreach Team at [phone].  Specialty Comments updated with care gap information.  Closing Encounter.

## 2017-03-31 NOTE — Telephone Encounter (Addendum)
Pt called back re message to follow-up on DM.   He requested OV with Dr. Mariel Sleet, however no openings next two weeks so patient requested to see another provider.   Scheduled with green pod provider Melynda Keller 10/24 at 11:20am.     Routing to [place]-- patient would like Dr. Mariel Sleet as his PCP, he's unsure why Dr. Everlean Cherry was added to his chart since he did not request a new provider. Let him know I would notify clinic since there may have been reason why chart was updated that I am unaware of. TY!

## 2017-04-01 NOTE — Telephone Encounter (Addendum)
Called and explained to patient, that the reason why he has a Advice worker listed as a PCP, is because he has Dr.Najafi as his PCP.    When she left she switched his care to her [facility]), and even though he has been following up with Jaffy he can continue to, but Im unable to make him his PCP as he has a close panel.      He expressed understanding.      Closing te

## 2017-04-07 ENCOUNTER — Encounter (INDEPENDENT_AMBULATORY_CARE_PROVIDER_SITE_OTHER): Payer: Self-pay | Admitting: Medical

## 2017-04-07 ENCOUNTER — Ambulatory Visit (INDEPENDENT_AMBULATORY_CARE_PROVIDER_SITE_OTHER): Payer: No Typology Code available for payment source | Admitting: Medical

## 2017-04-07 VITALS — BP 144/92 | HR 57 | Temp 97.4°F | Resp 16 | Wt 207.0 lb

## 2017-04-07 DIAGNOSIS — E1129 Type 2 diabetes mellitus with other diabetic kidney complication: Secondary | ICD-10-CM

## 2017-04-07 DIAGNOSIS — R809 Proteinuria, unspecified: Secondary | ICD-10-CM

## 2017-04-07 DIAGNOSIS — B351 Tinea unguium: Secondary | ICD-10-CM

## 2017-04-07 DIAGNOSIS — B353 Tinea pedis: Secondary | ICD-10-CM

## 2017-04-07 DIAGNOSIS — E119 Type 2 diabetes mellitus without complications: Secondary | ICD-10-CM

## 2017-04-07 DIAGNOSIS — Z6826 Body mass index (BMI) 26.0-26.9, adult: Secondary | ICD-10-CM

## 2017-04-07 DIAGNOSIS — I10 Essential (primary) hypertension: Secondary | ICD-10-CM | POA: Insufficient documentation

## 2017-04-07 LAB — LIPID PANEL
Cholesterol (LDL): 59 mg/dL (ref <130)
Cholesterol/HDL Ratio: 3.1
HDL Cholesterol: 37 mg/dL — ABNORMAL LOW (ref >39)
Non-HDL Cholesterol: 77 mg/dL (ref 0–159)
Total Cholesterol: 114 mg/dL (ref <200)
Triglyceride: 90 mg/dL (ref <150)

## 2017-04-07 LAB — COMPREHENSIVE METABOLIC PANEL
ALT (GPT): 24 U/L (ref 10–48)
AST (GOT): 19 U/L (ref 9–38)
Albumin: 4.1 g/dL (ref 3.5–5.2)
Alkaline Phosphatase (Total): 74 U/L (ref 39–139)
Anion Gap: 7 (ref 4–12)
Bilirubin (Total): 0.7 mg/dL (ref 0.2–1.3)
Calcium: 9.2 mg/dL (ref 8.9–10.2)
Carbon Dioxide, Total: 28 meq/L (ref 22–32)
Chloride: 106 meq/L (ref 98–108)
Creatinine: 1.6 mg/dL — ABNORMAL HIGH (ref 0.51–1.18)
GFR, Calc, African American: 55 mL/min/{1.73_m2} — ABNORMAL LOW (ref >59)
GFR, Calc, European American: 45 mL/min/{1.73_m2} — ABNORMAL LOW (ref >59)
Glucose: 136 mg/dL — ABNORMAL HIGH (ref 62–125)
Potassium: 3.9 meq/L (ref 3.6–5.2)
Protein (Total): 6.6 g/dL (ref 6.0–8.2)
Sodium: 141 meq/L (ref 135–145)
Urea Nitrogen: 19 mg/dL (ref 8–21)

## 2017-04-07 LAB — ALBUMIN/CREATININE RATIO, RANDOM URINE
Albumin (Micro), URN: 122.57 mg/dL
Albumin/Creatinine Ratio, URN: 936 mg/g{creat} — ABNORMAL HIGH (ref <30)
Creatinine/Unit, URN: 131 mg/dL

## 2017-04-07 LAB — PR A1C RAPID, ONSITE: Hemoglobin A1C: 6.4 % — ABNORMAL HIGH (ref 4.0–6.0)

## 2017-04-07 MED ORDER — KETOCONAZOLE 2 % EX CREA
TOPICAL_CREAM | CUTANEOUS | 3 refills | Status: DC
Start: 2017-04-07 — End: 2017-04-19

## 2017-04-07 MED ORDER — AMLODIPINE BESYLATE 2.5 MG OR TABS
2.5000 mg | ORAL_TABLET | Freq: Every day | ORAL | 0 refills | Status: DC
Start: 2017-04-07 — End: 2017-04-19

## 2017-04-07 MED ORDER — TERBINAFINE HCL 250 MG OR TABS
250.0000 mg | ORAL_TABLET | Freq: Every day | ORAL | 0 refills | Status: DC
Start: 2017-04-07 — End: 2017-04-19

## 2017-04-07 NOTE — Progress Notes (Signed)
S:  Sean Oliver who presents today for follow up of diabetes and HTN.     (E11.9) Controlled type 2 diabetes mellitus without complication, without long-term current use of insulin ([place])  (primary encounter diagnosis)  Diet:  Drinking a lot of root beer every day from [employer].  Trying to eat more vegetables but could be better. Doesn't usually eat breakfast.  Sometimes only eats once per day or once every other day.    Exercise: plans to do more walking.       (B35.3) Tinea pedis of both feet  Also has thickened, flaking nails on both sids.      (I10) Essential hypertension  Doesn't have medication bottles today.   His wife usually keeps track of his medications.    His wife texted him today and thinks he is only taking metformin, triamtere/HCTZ and atorvastatin.   Doesn't think he has been taking lisinopril.      ROS:  Constitutional: Denies fever, chills, fatigue, malaise   Cardiovascular: Denies chest pain, palpitations, dyspnea on exertion  Respiratory: Denies cough, wheezing, shortness of breath, tachypnea    Patient Active Problem List   Diagnosis   . Controlled type 2 diabetes mellitus with microalbuminuria, without long-term current use of insulin ([place])   . History of squamous cell carcinoma of skin   . Marijuana abuse   . Mediastinal lymphadenopathy   . Pulmonary nodule, right   . Essential hypertension       Outpatient Medications Prior to Visit   Medication Sig Dispense Refill   . Atorvastatin Calcium 40 MG Oral Tab      . Azithromycin (ZITHROMAX) 250 MG Oral Tab Take 2 tablets today, then take 1 tablet every day until gone. 6 tablet 0   . Fluticasone Propionate 50 MCG/ACT Nasal Suspension   3   . Hydrocodone-Acetaminophen 5-325 MG Oral Tab Take 1-2 tablets by mouth every 4 hours as needed for pain. For pain. Not to exceed 8 tablets per day. (Patient not taking: Reported on 04/07/2017) 3 tablet 0   . Ketoconazole 2 % External Cream For feet. Apply to affected area twice daily for 2  weeks then as needed. (Patient not taking: Reported on 07/15/2016) 30 g 3   . Lisinopril 20 MG Oral Tab 1 TABLET DAILY     . MetFORMIN HCl 1000 MG Oral Tab once daily     . Omeprazole 20 MG Oral Tab EC once daily     . Sildenafil Citrate (VIAGRA) 100 MG Oral Tab Take 1 tablet (100 mg) by mouth daily as needed for erectile dysfunction. Do not take with other blood pressure lowering medications 30 tablet 3   . Triamcinolone Acetonide 0.5 % External Cream Apple to rash on trunk 2-3x/day (Patient not taking: Reported on 07/15/2016) 30 g 1   . Triamterene-HCTZ 75-50 MG Oral Tab once and a half tablets daily       No facility-administered medications prior to visit.        Review of patient's allergies indicates:  No Known Allergies    Family History     Problem (# of Occurrences) Relation (Name,Age of Onset)    Hypertension (1) Father    Osteoporosis (1) Mother        Family Status   Relation Status   . Mo (Not Specified)   . Fa (Not Specified)     OBJECTIVE:  BP (!) 144/92   Pulse 57   Temp 97.4 F (36.3 C) (Temporal)  Resp 16   Wt 207 lb (93.9 kg)   SpO2 100%   BMI 26.58 kg/m   GENERAL: Well developed, well nourished patient in no acute distress.  Alert and oriented x3.    PULM:  Clear to auscultation bilaterally.  No wheezes, rales, or rhonchi.    CV:  RRR.  Nl S1 and S2 without murmurs rubs or gallops.    Extremities: warm and well perfused.  No ulcers.  Thickened, discolored toenails.      ASSESSMENT/PLAN:    (E11.9) Controlled type 2 diabetes mellitus without complication, without long-term current use of insulin ([place])  (primary encounter diagnosis)  A1C is adequately controlled.  DIscussed diet and exercise at length.    Plan: FOOT EXAM W/MONOFILAMENT & TUNING FORK    (B35.3) Tinea pedis of both feet  Plan: Ketoconazole 2 % External Cream    (I10) Essential hypertension  Not adequately controlled.  Unclear which medicines he is taking.  RTC in 1 month and will have him bring his wife and his medication  bottles with him.  Encouraged him to make follow up visits with his PCP consistently.    Plan: AmLODIPine Besylate 2.5 MG Oral Tab    (B35.1) Onychomycosis  Plan: Terbinafine HCl (LAMISIL) 250 MG Oral Tab,         HEPATIC FUNCTION PANEL    Dx, Tx, risks and alternatives discussed with patient and questions answered. Return to clinic if symptoms increase, persist, or worsen.    I spent a total time of 25 minutes face-to-face with the patient, of which more than 50% was spent counseling as outlined in this note.

## 2017-04-07 NOTE — Progress Notes (Signed)
Reason for Visit:   Chief Complaint   Patient presents with   . Diabetes     follow up   . Blood Pressure     follow up         Refills? YES  Referral? NO  Letter or Form? NO  Lab Results? NO    HEALTH MAINTENANCE:  Has the patient had this done since their last visit?  Cervical screening/PAP: N/A  Mammo: N/A  Colon Screen: DUE    Have you seen a specialist since your last visit: No    Vaccines Due? Yes, Flu - patient states he had it done at [facility] 1 month ago    HM Due:   Health Maintenance   Topic Date Due   . Hepatitis C Screening  1961-05-17   . HIV Screening  02/25/1978   . Diabetes Foot Exam  02/25/1981   . Diabetes Eye Exam  02/25/1981   . Colorectal Cancer Screening (FOBT/FIT)  02/25/2013   . Zoster Vaccine (1 of 2) 02/25/2013   . Diabetes A1c  04/20/2015   . Influenza Vaccine (1) 03/15/2017   . Depression Screening (PHQ-2)  03/04/2018   . Lipid Disorders Screening  10/18/2019   . Tetanus Vaccine  02/07/2021       PCP Verified?  Yes, Lelan Pons, MD

## 2017-04-07 NOTE — Patient Instructions (Addendum)
-  blood pressure looks better today but we still want to verify your medications.  Please bring the medication bottles to your next visit.  Mis-communications are common.  It might be a good idea to bring your wife as well.      I recommend:  *Eating more vegetables, fruits, walnuts, beans, olive oil, fish, lean chicken, and whole grains.  *eat 3-5 small meals per day  *Increasing your activity  *Avoiding foods high in saturated fats and starches such as red meat, butter, fried foods, cheese, breads, rice, pasta, potatoes, etc.      Snack ideas:  -fruit and a slice of cheddar  -celery sticks and cream cheese  -baked chicken or rotisserie chicken from the grocery store or cooked at home with salad      At [facility], we are dedicated to providing high quality health care. You may receive an evaluation in the mail about your recent visit. Please take a few moments to fill out the survey. Your feedback, both positive and negative, is very important to help [place] provide you the best care possible and to consistently make improvements. Thank you in advance for taking the time to respond. Rigoberto Noel

## 2017-04-08 LAB — HEPATITIS C AB WITH REFLEX PCR: Hepatitis C Antibody w/Rflx PCR: NONREACTIVE

## 2017-04-08 LAB — HIV ANTIGEN AND ANTIBODY SCRN
HIV Antigen and Antibody Interpretation: NONREACTIVE
HIV Antigen and Antibody Result: NONREACTIVE

## 2017-04-13 ENCOUNTER — Telehealth (INDEPENDENT_AMBULATORY_CARE_PROVIDER_SITE_OTHER): Payer: Self-pay | Admitting: Medical

## 2017-04-13 ENCOUNTER — Encounter (INDEPENDENT_AMBULATORY_CARE_PROVIDER_SITE_OTHER): Payer: Self-pay | Admitting: Medical

## 2017-04-13 NOTE — Telephone Encounter (Signed)
I left a message for Sean Oliver about his results which showed an elevated creatinine and microalbumin and reminded him to be sure to follow up on his blood pressure next week.

## 2017-04-15 NOTE — Telephone Encounter (Signed)
Error

## 2017-04-19 ENCOUNTER — Ambulatory Visit (INDEPENDENT_AMBULATORY_CARE_PROVIDER_SITE_OTHER): Payer: No Typology Code available for payment source | Admitting: Family Medicine

## 2017-04-19 ENCOUNTER — Encounter (INDEPENDENT_AMBULATORY_CARE_PROVIDER_SITE_OTHER): Payer: Self-pay | Admitting: Family Medicine

## 2017-04-19 VITALS — BP 188/125 | HR 71 | Temp 98.1°F | Resp 14 | Wt 210.6 lb

## 2017-04-19 DIAGNOSIS — E7849 Other hyperlipidemia: Secondary | ICD-10-CM

## 2017-04-19 DIAGNOSIS — E1129 Type 2 diabetes mellitus with other diabetic kidney complication: Secondary | ICD-10-CM

## 2017-04-19 DIAGNOSIS — Z6827 Body mass index (BMI) 27.0-27.9, adult: Secondary | ICD-10-CM

## 2017-04-19 DIAGNOSIS — I10 Essential (primary) hypertension: Secondary | ICD-10-CM

## 2017-04-19 DIAGNOSIS — R809 Proteinuria, unspecified: Secondary | ICD-10-CM

## 2017-04-19 DIAGNOSIS — N183 Chronic kidney disease, stage 3 unspecified: Secondary | ICD-10-CM

## 2017-04-19 LAB — BASIC METABOLIC PANEL
Anion Gap: 6 (ref 4–12)
Calcium: 9.2 mg/dL (ref 8.9–10.2)
Carbon Dioxide, Total: 29 meq/L (ref 22–32)
Chloride: 106 meq/L (ref 98–108)
Creatinine: 1.61 mg/dL — ABNORMAL HIGH (ref 0.51–1.18)
GFR, Calc, African American: 54 mL/min/{1.73_m2} — ABNORMAL LOW (ref >59)
GFR, Calc, European American: 45 mL/min/{1.73_m2} — ABNORMAL LOW (ref >59)
Glucose: 102 mg/dL (ref 62–125)
Potassium: 3.8 meq/L (ref 3.6–5.2)
Sodium: 141 meq/L (ref 135–145)
Urea Nitrogen: 19 mg/dL (ref 8–21)

## 2017-04-19 LAB — URINALYSIS COMPLETE, URN
Bacteria, URN: NONE SEEN
Bilirubin (Qual), URN: NEGATIVE
Epith Cells_Renal/Trans,URN: NEGATIVE /HPF
Epith Cells_Squamous, URN: NEGATIVE /LPF
Glucose Qual, URN: NEGATIVE mg/dL
Ketones, URN: NEGATIVE mg/dL
Leukocyte Esterase, URN: NEGATIVE
Nitrite, URN: NEGATIVE
RBC, URN: NEGATIVE /HPF
Specific Gravity, URN: 1.014 g/mL (ref 1.002–1.027)
WBC, URN: NEGATIVE /HPF
pH, URN: 6 (ref 5.0–8.0)

## 2017-04-19 MED ORDER — AMLODIPINE BESYLATE 5 MG OR TABS
5.0000 mg | ORAL_TABLET | Freq: Every day | ORAL | 1 refills | Status: DC
Start: 2017-04-19 — End: 2017-05-01

## 2017-04-19 NOTE — Progress Notes (Signed)
Seen  HTN  Elevated today. Only taking lisinopril. Added amlodipine. Rechecking his kidney function

## 2017-04-19 NOTE — Progress Notes (Signed)
Reason for Visit: follow up    Refills? NO  Referral? NO  Letter or Form? NO  Lab Results? NO    HEALTH MAINTENANCE:  Has the patient had this done since their last visit?  Cervical screening/PAP: N/A  Mammo: N/A  Colon Screen: due    Have you seen a specialist since your last visit: No    Vaccines Due? Yes, shingles    HM Due:   Health Maintenance   Topic Date Due   . Diabetes Eye Exam  02/25/1981   . Colorectal Cancer Screening (FOBT/FIT)  02/25/2013   . Zoster Vaccine (1 of 2) 02/25/2013   . Diabetes A1c  10/06/2017   . Depression Screening (PHQ-2)  03/04/2018   . Diabetes Foot Exam  04/07/2018   . Tetanus Vaccine  02/07/2021   . Lipid Disorders Screening  04/07/2022   . Influenza Vaccine  Completed   . Hepatitis C Screening  Completed   . HIV Screening  Completed       PCP Verified?  Yes, Lelan Pons, MD

## 2017-04-19 NOTE — Progress Notes (Signed)
Chief Complaint   Patient presents with   . Follow-Up      BP       Subjective:     Sean Oliver is a 56 year old male who presents on 04/19/2017 for the following concerns:    1. Hypertension  -Does not check blood pressue at home  -Taking lisinopril 40mg  daily; no side effects    2. Diabetes  -Does not check BGs at home  -Takes metformin 1000mg  once daily    3. Hyperlipidemia  -Takes atorvastatin 40mg  daily  -No side effects    I reviewed the patient's recorded medical history, and updated as appropriate the medications, problem list, allergies and past medical history with the patient.     Review of Systems:   Gen: Negative for fevers, chills, night sweats, weight loss  HEENT: Negative for vision changes  Respiratory: Negative for shortness of breath and cough  Cardiac: Negative for chest pain and palpitations  Neuro: Positive for intermittent headache, negative for motor weakness or sensory changes    Objective:   Vitals: BP (!) 188/125   Pulse 71   Temp 98.1 F (36.7 C) (Temporal)   Resp 14   Wt 210 lb 9.6 oz (95.5 kg)   SpO2 100%   BMI 27.04 kg/m   General:  Well nourished, well-developed.  Alert and interactive.  Eyes: No scleral icterus, no conjunctival injection, no ocular discharge.  Lungs: Normal WOB. CTAB. No w/r/r.  Heart: RRR, normal S1/S2. No S3/S4.  No murmurs, rubs or gallops appreciated.    Abdomen: +BS in all 4 quadrants, no tympany, non-distended, soft, no palpable HSM, no hernias, no rebound or guarding  Extremities: Warm and well-perfused. No LE edema.  Skin: No jaundice. No obvious rashes.   Psych:  Alert and oriented.  Recent and remote memory normal.  Mood and affect normal.  Judgement and insight appropriate.  Normal speech.  Thought pattern linear.  Well-groomed.  Maintains good eye contact.    Assessment and Plan:   (I10) Essential hypertension  (primary encounter diagnosis)  BP 188/125 today. Patient is asymptomatic. We discussed the risks of elevated blood pressure including  damage to organ systems, such as the kidney.   Plan:   -Continue lisinopril 40mg  PO QD  -Start AmLODIPine Besylate 5 MG Oral Tab   -Risks, benefits, and side effects of medication as well as alternative therapies reviewed with patient.  -BASIC METABOLIC PANEL  -Purchase a cuff to test BP at home or go to phamacy  -Follow-up in 1-2 weeks        (E11.29,  R80.9) Controlled type 2 diabetes mellitus with microalbuminuria, without long-term current use of insulin (HCC)  Previous lab tests reviewed. HbA1C 6.4 on 04/07/2017.   Plan:   -Continue metformin    (N18.3) CKD stage 3  Labs reviewed with patienet - Cr 1.60, GFR 55. It appears patient has history of CKD, as there is a Stage 3 diagnosis when he was being seen at [facility] in 2008. Suspect this is secondary to elevated blood pressure/diabetic nephropathy; patient also presents with proteinuria (see below). Will initiate work-up for proteinuria and obtain renal ultrasound.  -Renal ultrasound, as below  -Strict blood pressure and diabetic control    (R80.9) Proteinuria, unspecified type  Previous labs reviewed - Alb/Cr ratio 936 on 04/07/2017 which is a sizable increase from 10/2014 (73). Will initiate workup for causes of proteinuria, including SPEP, FLC, renal ultrasound.   Plan:   -[place] RETROPERITONEAL COMPLETE,   -Urinalysis  Complete, URN,   -MONOCLONAL PROTEIN PANEL, SRM,   -PROTEIN ELECTROPHORESIS , URN,  -Consider nephrology referral after studies are completed     (E78.49) Hyperlipidemia  LDL 59 on 04/07/2017.   Plan:   -Continue atorvastatin     Larose Kells, MD  [facility]  [place] [id number]  [phone]      Active Ambulatory Problems     Diagnosis Date Noted   . Controlled type 2 diabetes mellitus with microalbuminuria, without long-term current use of insulin ([place])    . History of squamous cell carcinoma of skin 04/02/2015   . Marijuana abuse 03/03/2016   . Mediastinal lymphadenopathy 03/03/2016   . Pulmonary  nodule, right 03/03/2016   . Essential hypertension 04/07/2017     Resolved Ambulatory Problems     Diagnosis Date Noted   . No Resolved Ambulatory Problems     Past Medical History:   Diagnosis Date   . Hypertension    . Type II or unspecified type diabetes mellitus without mention of complication, not stated as uncontrolled ([place])        Patient's Medications   New Prescriptions    No medications on file   Previous Medications    AMLODIPINE BESYLATE 2.5 MG ORAL TAB    Take 1 tablet (2.5 mg) by mouth daily.    ATORVASTATIN CALCIUM 40 MG ORAL TAB        FLUTICASONE PROPIONATE 50 MCG/ACT NASAL SUSPENSION        KETOCONAZOLE 2 % EXTERNAL CREAM    For feet. Apply to affected area twice daily for 2 weeks then as needed.    LISINOPRIL 40 MG ORAL TAB        METFORMIN HCL 1000 MG ORAL TAB    once daily    OMEPRAZOLE 20 MG ORAL TAB EC    once daily    SILDENAFIL CITRATE (VIAGRA) 100 MG ORAL TAB    Take 1 tablet (100 mg) by mouth daily as needed for erectile dysfunction. Do not take with other blood pressure lowering medications    TERBINAFINE HCL (LAMISIL) 250 MG ORAL TAB    Take 1 tablet (250 mg) by mouth daily. For 12 weeks. Check labs in 1 month    TRIAMCINOLONE ACETONIDE 0.5 % EXTERNAL CREAM    Apple to rash on trunk 2-3x/day    TRIAMTERENE-HCTZ 75-50 MG ORAL TAB    once and a half tablets daily   Modified Medications    No medications on file   Discontinued Medications    No medications on file

## 2017-04-20 LAB — PROTEIN ELECTR. REFLX PNL

## 2017-04-20 LAB — PROTEIN ELECTROPHORESIS , URN

## 2017-04-21 LAB — MYELOMA, MINIMAL RESIDUAL PROTEIN DETECTION PANEL
Albumin: 4.3 g/dL (ref 3.5–4.9)
Alpha 1: 0.2 g/dL (ref 0.1–0.3)
Alpha 2: 0.6 g/dL (ref 0.3–0.8)
Beta: 0.7 g/dL (ref 0.6–1.0)
Gamma: 0.9 g/dL (ref 0.4–1.4)
Protein (Total): 6.7 g/dL (ref 6.0–8.2)

## 2017-04-21 LAB — IMMUNOFIXATION

## 2017-04-22 LAB — FREE LIGHT CHAINS
Kappa Free Light Chain: 2.81 mg/dL — ABNORMAL HIGH (ref 0.33–1.94)
Kappa/Lambda FLC Ratio: 1.14 (ref 0.26–1.65)
Lambda Free Light Chain: 2.47 mg/dL (ref 0.57–2.63)

## 2017-04-23 ENCOUNTER — Telehealth (INDEPENDENT_AMBULATORY_CARE_PROVIDER_SITE_OTHER): Payer: Self-pay | Admitting: Family Medicine

## 2017-04-23 NOTE — Telephone Encounter (Signed)
Call back to [facility] and left message per Dr Rogue Bussing below  Also left clinic # and instructions to ask for RN for questions/concerns  He is scheduled for U/S 04/28/17 at [facility]

## 2017-04-23 NOTE — Telephone Encounter (Signed)
Please call patient and let him know that his initial lab tests show evidence of chronic kidney disease. This can often be due to diabetes or high blood pressure.    He is scheduled for a renal ultrasound in a few days. After that is completed, we will have more information and will make a determination for next steps at that time. Thanks!

## 2017-04-26 DIAGNOSIS — N183 Chronic kidney disease, stage 3 unspecified: Secondary | ICD-10-CM | POA: Insufficient documentation

## 2017-04-26 DIAGNOSIS — E7849 Other hyperlipidemia: Secondary | ICD-10-CM | POA: Insufficient documentation

## 2017-04-26 DIAGNOSIS — R809 Proteinuria, unspecified: Secondary | ICD-10-CM | POA: Insufficient documentation

## 2017-04-28 ENCOUNTER — Telehealth (INDEPENDENT_AMBULATORY_CARE_PROVIDER_SITE_OTHER): Payer: Self-pay | Admitting: Family Medicine

## 2017-04-28 ENCOUNTER — Other Ambulatory Visit: Payer: Self-pay | Admitting: Family Medicine

## 2017-04-28 ENCOUNTER — Ambulatory Visit: Payer: No Typology Code available for payment source | Attending: Family Medicine

## 2017-04-28 DIAGNOSIS — R809 Proteinuria, unspecified: Secondary | ICD-10-CM | POA: Insufficient documentation

## 2017-04-28 DIAGNOSIS — I10 Essential (primary) hypertension: Secondary | ICD-10-CM | POA: Insufficient documentation

## 2017-04-28 NOTE — Telephone Encounter (Signed)
Dr. Zenia Resides -- Radiologist at [facility] -- called with results regarding the patient.     He states that the patient's ultrasound was abnormal and recommends CT.     Please call Dr. Zenia Resides back at [phone]. This is Dr. Ayesha Rumpf phone number today, but he will be at different offices for the rest of the week.     Routing to Dr. Rogue Bussing, Dr. Gregor Hams, and Pod 3.

## 2017-04-30 NOTE — Telephone Encounter (Signed)
LMTCB - Informed patient that an appointment is on hold for him with Dr. Gregor Hams tomorrow at 10:15am. Requested that he contact us to confirm whether or not he could make it.     1st Attempt    CCR: Please transfer to NG FD, [phone]    PSR: Please schedule patient in the on-hold appointment slot with Dr. Gregor Hams tomorrow (11/17) at 10:15am. Let him know that this appointment is to discuss the results of his recent ultrasound. -Thanks!    Routing to NG FD for future contact attempts and to Dr. Gregor Hams as Juluis Rainier.

## 2017-04-30 NOTE — Telephone Encounter (Signed)
Called and spoke to patient.     Patient had previously scheduled an appointment for Monday, 11/19, with Dr. Danne Baxter to discuss ultrasound results.     Patient confirmed he can come in tomorrow (11/17) to see Dr. Gregor Hams instead, but wanted to keep his appointment on 11/19 as a blood pressure follow-up appointment.     Officially add patient to Dr. Durwin Nora Achkar's Saturday schedule.     Routing to Dr. Gregor Hams as Juluis Rainier.     Closing TE.

## 2017-05-01 ENCOUNTER — Encounter (INDEPENDENT_AMBULATORY_CARE_PROVIDER_SITE_OTHER): Payer: Self-pay | Admitting: Family Medicine

## 2017-05-01 ENCOUNTER — Ambulatory Visit (INDEPENDENT_AMBULATORY_CARE_PROVIDER_SITE_OTHER): Payer: No Typology Code available for payment source | Admitting: Family Medicine

## 2017-05-01 VITALS — BP 163/102 | HR 66 | Temp 97.7°F

## 2017-05-01 DIAGNOSIS — N2889 Other specified disorders of kidney and ureter: Secondary | ICD-10-CM

## 2017-05-01 DIAGNOSIS — N189 Chronic kidney disease, unspecified: Secondary | ICD-10-CM

## 2017-05-01 DIAGNOSIS — N529 Male erectile dysfunction, unspecified: Secondary | ICD-10-CM

## 2017-05-01 DIAGNOSIS — I10 Essential (primary) hypertension: Secondary | ICD-10-CM

## 2017-05-01 MED ORDER — AMLODIPINE BESYLATE 5 MG OR TABS
10.0000 mg | ORAL_TABLET | Freq: Every day | ORAL | 12 refills | Status: DC
Start: 2017-05-01 — End: 2017-05-11

## 2017-05-01 MED ORDER — SILDENAFIL CITRATE 100 MG OR TABS
100.0000 mg | ORAL_TABLET | Freq: Every day | ORAL | 3 refills | Status: DC | PRN
Start: 2017-05-01 — End: 2018-07-05

## 2017-05-01 MED ORDER — CARVEDILOL 6.25 MG OR TABS
6.2500 mg | ORAL_TABLET | Freq: Two times a day (BID) | ORAL | 12 refills | Status: DC
Start: 2017-05-01 — End: 2017-05-11

## 2017-05-01 NOTE — Progress Notes (Signed)
Roomed by and vitals taken by Gina Chapman, Medical Assistant

## 2017-05-01 NOTE — Patient Instructions (Signed)
1) Stop lisinopril  2) Start carvidelol  3) increase amlodipine to 10 mg daily  4) get CT scan  5) Follow up on Wednesday with me

## 2017-05-03 ENCOUNTER — Encounter (INDEPENDENT_AMBULATORY_CARE_PROVIDER_SITE_OTHER): Payer: Self-pay | Admitting: Family Medicine

## 2017-05-03 ENCOUNTER — Encounter (INDEPENDENT_AMBULATORY_CARE_PROVIDER_SITE_OTHER): Payer: No Typology Code available for payment source | Admitting: Family Medicine

## 2017-05-03 NOTE — Progress Notes (Signed)
Sean Oliver is a 56 year old male who presents today for f/u for test results.       Assessment and Plan:    1. Kidney mass  Recent US on 11/14 showed "A focal left renal lesion is noted, not a simple cyst, possibly a solid mass although nonspecific appearance. This is about 2 cm in size. Further evaluation using multiphasic contrast-enhanced CT is suggested."   I discussed results with patient and advised to get the CT. GFR is slightly less than 60 and I spoke to radiology who clarified that it should be ok.   - CT ABDOMEN AND PELVIS WO/W CONTRAST    2. Erectile dysfunction, unspecified erectile dysfunction type  Refilled.   - Sildenafil Citrate (VIAGRA) 100 MG Oral Tab; Take 1 tablet (100 mg) by mouth daily as needed for erectile dysfunction. Do not take with other blood pressure lowering medications  Dispense: 30 tablet; Refill: 3    3. Essential hypertension  Poorly controlled BP. He also has elevated creatinine. I held the lisinopril and started coreg. Will recheck creatinine after CT with contrast is done.   - AmLODIPine Besylate 5 MG Oral Tab; Take 2 tablets (10 mg) by mouth daily.  Dispense: 180 tablet; Refill: 12  - Carvedilol 6.25 MG Oral Tab; Take 1 tablet (6.25 mg) by mouth 2 times a day.  Dispense: 60 tablet; Refill: 12    4. CKD  He has elevated Creatinine 1.6 (GFR 54). And proteinuria (936 on 10/24). Some protein bands in the urine unclear if monoclonal. We will refer to nephrology after we find out if the mass is clinically relevant.     I spent a total time of 25 minutes face-to-face with the patient, of which more than 50% was spent counseling and coordinating care including speaking to radiology and counseling about finding and plan as outlined in this note.    ---    Follow up: in 1 week.    Subjective:     History of Present Illness:     US showing concerning mass  A focal left renal lesion is noted, not a simple cyst, possibly a solid mass although nonspecific appearance. This is about  2 cm in size. Patient was contacted and this apt was made to inform about results and to order CT.     HTN  It has been poorly controlled for a while. He is taking lisinopril 40 and amlodipine 5. No sx's of SOB or CP.    ED  Needs refills    CKD  He has elevated creatinine and protein in the urine. The workup was started SPEP/UPEP and [place]. The plan is to send to nephrology.     Objective:     BP (!) 163/102   Pulse 66   Temp 97.7 F (36.5 C) (Temporal)   SpO2 100%     Exam:    Alert and oriented and in no acute distress.     Supplemental Information:  Outpatient Prescriptions Marked as Taking for the 05/01/17 encounter (Office Visit) with Fabio Neighbors, MD   Medication Sig Dispense Refill   . AmLODIPine Besylate 5 MG Oral Tab Take 2 tablets (10 mg) by mouth daily. 180 tablet 12   . Atorvastatin Calcium 40 MG Oral Tab      . Fluticasone Propionate 50 MCG/ACT Nasal Suspension   3   . MetFORMIN HCl 1000 MG Oral Tab once daily      ,   Patient Active Problem  List   Diagnosis   . Controlled type 2 diabetes mellitus with microalbuminuria, without long-term current use of insulin ([place])   . History of squamous cell carcinoma of skin   . Marijuana abuse   . Mediastinal lymphadenopathy   . Pulmonary nodule, right   . Essential hypertension   . CKD (chronic kidney disease) stage 3, GFR 30-59 ml/min (HCC)   . Proteinuria   . Other hyperlipidemia   , ,   Past Medical History:   Diagnosis Date   . Hypertension    . Type II or unspecified type diabetes mellitus without mention of complication, not stated as uncontrolled ([place])    ,   Past Surgical History:   Procedure Laterality Date   . left eye surgery x 5     . RPR INGUN HERNIA SLIDING ANY AGE      many years ago.  bilateral.   ,   Social History     Social History Narrative   . No narrative on file   ,   Social History   Substance Use Topics   . Smoking status: Current Some Day Smoker   . Smokeless tobacco: Never Used      Comment: occasional cigar   . Alcohol use Yes       Comment: beer Rarely     and   Family History     Problem (# of Occurrences) Relation (Name,Age of Onset)    Hypertension (1) Father    Osteoporosis (1) Mother

## 2017-05-04 ENCOUNTER — Ambulatory Visit (HOSPITAL_BASED_OUTPATIENT_CLINIC_OR_DEPARTMENT_OTHER): Payer: No Typology Code available for payment source

## 2017-05-04 ENCOUNTER — Ambulatory Visit
Admit: 2017-05-04 | Discharge: 2017-05-04 | Disposition: A | Discharge status: Home / Self Care | Payer: No Typology Code available for payment source | Attending: Family Medicine | Admitting: Family Medicine

## 2017-05-04 DIAGNOSIS — N2889 Other specified disorders of kidney and ureter: Secondary | ICD-10-CM | POA: Insufficient documentation

## 2017-05-04 LAB — CREATININE BY I_STAT (POC), ~~LOC~~: Creatinine (POC): 1.7 mg/dL — ABNORMAL HIGH (ref 0.51–1.18)

## 2017-05-05 ENCOUNTER — Ambulatory Visit (INDEPENDENT_AMBULATORY_CARE_PROVIDER_SITE_OTHER): Payer: No Typology Code available for payment source | Admitting: Family Medicine

## 2017-05-05 ENCOUNTER — Encounter (INDEPENDENT_AMBULATORY_CARE_PROVIDER_SITE_OTHER): Payer: Self-pay | Admitting: Family Medicine

## 2017-05-05 VITALS — BP 168/110 | HR 51 | Temp 97.8°F | Resp 16 | Wt 209.4 lb

## 2017-05-05 DIAGNOSIS — Z6826 Body mass index (BMI) 26.0-26.9, adult: Secondary | ICD-10-CM

## 2017-05-05 DIAGNOSIS — N2889 Other specified disorders of kidney and ureter: Secondary | ICD-10-CM

## 2017-05-05 DIAGNOSIS — E1129 Type 2 diabetes mellitus with other diabetic kidney complication: Secondary | ICD-10-CM

## 2017-05-05 DIAGNOSIS — N183 Chronic kidney disease, stage 3 unspecified: Secondary | ICD-10-CM

## 2017-05-05 DIAGNOSIS — I10 Essential (primary) hypertension: Secondary | ICD-10-CM

## 2017-05-05 DIAGNOSIS — R809 Proteinuria, unspecified: Secondary | ICD-10-CM

## 2017-05-05 NOTE — Patient Instructions (Addendum)
Your MRI is scheduled on Nov     Monday Nov 26, 3:00pm check in for 3:30 plan at the [facility] [address], [place] [phone]

## 2017-05-05 NOTE — Progress Notes (Signed)
Reason for Visit: follow up    Refills? NO  Referral? NO  Letter or Form? NO  Lab Results? NO    HEALTH MAINTENANCE:  Has the patient had this done since their last visit?  Cervical screening/PAP: N/A  Mammo: N/A  Colon Screen: due    Have you seen a specialist since your last visit: No    Vaccines Due? No    HM Due:   Health Maintenance   Topic Date Due   . Diabetes Eye Exam  02/25/1981   . Colorectal Cancer Screening (FOBT/FIT)  02/25/2013   . Zoster Vaccine (1 of 2) 02/25/2013   . Diabetes A1c  10/06/2017   . Depression Screening (PHQ-2)  03/04/2018   . Diabetes Foot Exam  04/07/2018   . Tetanus Vaccine  02/07/2021   . Lipid Disorders Screening  04/07/2022   . Influenza Vaccine  Completed   . Hepatitis C Screening  Completed   . HIV Screening  Completed       PCP Verified?  Yes, Maryla Morrow, MD

## 2017-05-10 ENCOUNTER — Ambulatory Visit: Payer: No Typology Code available for payment source | Attending: Family Medicine

## 2017-05-10 DIAGNOSIS — N2889 Other specified disorders of kidney and ureter: Secondary | ICD-10-CM

## 2017-05-10 LAB — CREATININE BY I_STAT (POC), ESC: Creatinine: 1.5 mg/dL — ABNORMAL HIGH (ref 0.51–1.18)

## 2017-05-11 ENCOUNTER — Telehealth (INDEPENDENT_AMBULATORY_CARE_PROVIDER_SITE_OTHER): Payer: Self-pay | Admitting: Family Medicine

## 2017-05-11 ENCOUNTER — Other Ambulatory Visit (INDEPENDENT_AMBULATORY_CARE_PROVIDER_SITE_OTHER): Payer: Self-pay | Admitting: Family Medicine

## 2017-05-11 ENCOUNTER — Encounter (INDEPENDENT_AMBULATORY_CARE_PROVIDER_SITE_OTHER): Payer: Self-pay | Admitting: Family Medicine

## 2017-05-11 ENCOUNTER — Ambulatory Visit (INDEPENDENT_AMBULATORY_CARE_PROVIDER_SITE_OTHER): Payer: No Typology Code available for payment source | Admitting: Family Medicine

## 2017-05-11 VITALS — BP 178/112 | HR 56 | Temp 98.4°F | Wt 209.0 lb

## 2017-05-11 DIAGNOSIS — N183 Chronic kidney disease, stage 3 unspecified: Secondary | ICD-10-CM

## 2017-05-11 DIAGNOSIS — I10 Essential (primary) hypertension: Secondary | ICD-10-CM

## 2017-05-11 DIAGNOSIS — E1129 Type 2 diabetes mellitus with other diabetic kidney complication: Secondary | ICD-10-CM

## 2017-05-11 DIAGNOSIS — R809 Proteinuria, unspecified: Secondary | ICD-10-CM

## 2017-05-11 DIAGNOSIS — C649 Malignant neoplasm of unspecified kidney, except renal pelvis: Secondary | ICD-10-CM

## 2017-05-11 DIAGNOSIS — N2889 Other specified disorders of kidney and ureter: Secondary | ICD-10-CM

## 2017-05-11 DIAGNOSIS — Z6826 Body mass index (BMI) 26.0-26.9, adult: Secondary | ICD-10-CM

## 2017-05-11 MED ORDER — METOPROLOL SUCCINATE ER 25 MG OR TB24
50.0000 mg | EXTENDED_RELEASE_TABLET | Freq: Every day | ORAL | 3 refills | Status: DC
Start: 2017-05-11 — End: 2017-09-02

## 2017-05-11 MED ORDER — METOPROLOL SUCCINATE ER 25 MG OR TB24
50.0000 mg | EXTENDED_RELEASE_TABLET | Freq: Every day | ORAL | 3 refills | Status: DC
Start: 2017-05-11 — End: 2017-05-11

## 2017-05-11 MED ORDER — CLONIDINE HCL 0.1 MG OR TABS
0.1000 mg | ORAL_TABLET | Freq: Two times a day (BID) | ORAL | 2 refills | Status: DC
Start: 2017-05-11 — End: 2017-05-11

## 2017-05-11 MED ORDER — AMLODIPINE BESYLATE 10 MG OR TABS
10.0000 mg | ORAL_TABLET | Freq: Every day | ORAL | 12 refills | Status: DC
Start: 2017-05-11 — End: 2017-09-16

## 2017-05-11 NOTE — Progress Notes (Signed)
Sean Oliver is a 56 year old male who presents today for f/u on kidney mass.       Assessment and Plan:    1. Kidney mass  I discussed with radiology fellow the findings on CT and he suggested that it is concerning for renal cell   Carcinoma but also can be oncocytoma or metastasis. Plan is to get MRI before proceeding with biopsy or referral.   - MRI ABDOMEN WO/W CONTRAST    2. CKD (chronic kidney disease) stage 3, GFR 30-59 ml/min (HCC)  Likely multi-factorial (HTn, DM, kidney tumor, etc). He will need to see nephrology eventually.    3. Essential hypertension  Poorly controlled. Stress probably contributing today as well. We will asdjust next visit. On Coreg 6.25 BID and Amlodipine 10. There is room to increase coreg    4. Controlled type 2 diabetes mellitus with microalbuminuria, without long-term current use of insulin (HCC)  Controlled A1C 6.4. On Metformin.     I spent a total time of 25 minutes face-to-face with the patient, of which more than 50% was spent counseling and coordinating care including discussing case with radiology and explaining finding to patient as outlined in this note.      ---    Follow up: one week    Subjective:     History of Present Illness:     Kidney mass  Found on an ultrasound workup for his elevated creatinine. He is asymptomatic from the mass standpoint. Today he and his significant other are very concerned. He had recent US on 11/14 showed "A focal left renal lesion is noted, not a simple cyst, possibly a solid mass although nonspecific appearance. This is about 2 cm in size. Further evaluation using multiphasic contrast-enhanced CT is suggested."  At that time, I discussed results with patient and advised to get the CT. Today patient is here to get results.     HTN  HTN has been difficult to control. Because of the contrast and elevated creatinine, I held lisinopril and started him on Coreg instead. Today BP is 168/110. He was also very nervous  though.    CKD  Creatinine was done prior to CT and it was around 1.7. His GFR is only slightly below 60.       Objective:     BP (!) 168/110   Pulse 51   Temp 97.8 F (36.6 C) (Temporal)   Resp 16   Wt 209 lb 6.4 oz (95 kg)   SpO2 100%   BMI 26.89 kg/m     Exam:    Alert and oriented in no acute distress  Appears to be very concerned.     Supplemental Information:  Outpatient Prescriptions Marked as Taking for the 05/05/17 encounter (Office Visit) with Fabio Neighbors, MD   Medication Sig Dispense Refill   . AmLODIPine Besylate 5 MG Oral Tab Take 2 tablets (10 mg) by mouth daily. 180 tablet 12   . Atorvastatin Calcium 40 MG Oral Tab      . Carvedilol 6.25 MG Oral Tab Take 1 tablet (6.25 mg) by mouth 2 times a day. 60 tablet 12   . Fluticasone Propionate 50 MCG/ACT Nasal Suspension   3   . MetFORMIN HCl 1000 MG Oral Tab once daily     . Sildenafil Citrate (VIAGRA) 100 MG Oral Tab Take 1 tablet (100 mg) by mouth daily as needed for erectile dysfunction. Do not take with other blood pressure lowering medications 30 tablet  3    ,   Patient Active Problem List   Diagnosis   . Controlled type 2 diabetes mellitus with microalbuminuria, without long-term current use of insulin ([place])   . History of squamous cell carcinoma of skin   . Marijuana abuse   . Mediastinal lymphadenopathy   . Pulmonary nodule, right   . Essential hypertension   . CKD (chronic kidney disease) stage 3, GFR 30-59 ml/min (HCC)   . Proteinuria   . Other hyperlipidemia   , ,   Past Medical History:   Diagnosis Date   . Hypertension    . Type II or unspecified type diabetes mellitus without mention of complication, not stated as uncontrolled ([place])    ,   Past Surgical History:   Procedure Laterality Date   . left eye surgery x 5     . RPR INGUN HERNIA SLIDING ANY AGE      many years ago.  bilateral.   ,   Social History     Social History Narrative   . No narrative on file   ,   Social History   Substance Use Topics   . Smoking status: Current  Some Day Smoker   . Smokeless tobacco: Never Used      Comment: occasional cigar   . Alcohol use Yes      Comment: beer Rarely     and   Family History     Problem (# of Occurrences) Relation (Name,Age of Onset)    Hypertension (1) Father    Osteoporosis (1) Mother

## 2017-05-11 NOTE — Progress Notes (Signed)
Sean Oliver is a 56 year old male who presents today for f/u on renal mass and for htn.       Assessment and Plan:    1. Renal mass  MRI, which was ordered after discussing with Radiology post CT, showed more suspicion of renal cell carcinoma. I put referral to uro-onc.     2. Essential hypertension  He prefers daily treatment. I considered the treatment options with him and his wife. I offered clonidine but they preferred daily med. He is on coreg which did not do much and lower his heart rate some. I switched him now to metoprolol which is daily dosing, maybe he will be able to take it more often than the coreg. I may consider restarting on lisinopril. I am also consulting card for advice.   - AmLODIPine Besylate 10 MG Oral Tab; Take 1 tablet (10 mg) by mouth daily.  Dispense: 90 tablet; Refill: 12  - Metoprolol Succinate ER 25 MG Oral TABLET SR 24 HR; Take 2 tablets (50 mg) by mouth daily. Do not chew or crush.  Dispense: 60 tablet; Refill: 3  -econsult to cardiology     3. Controlled type 2 diabetes mellitus with microalbuminuria, without long-term current use of insulin ([place])    4. CKD (chronic kidney disease) stage 3, GFR 30-59 ml/min (HCC)  5. Proteinuria, unspecified type  We will eventually need to refer to nephrology. This may need to come up soon as consideration for further investigation/treatment for the renal mass are now taken.     I spent a total time of 40 minutes face-to-face with the patient, of which more than 50% was spent counseling and coordinating care including attempting to schedule apt and discussing results and discussing options for htn treatment as outlined in this note.    ---    Follow up: in 2-3 wk.    Subjective:     History of Present Illness:     Renal mass  He has been asymptomatic with the exception of poorly controlled HTN. Renal mass was found incidentally on US done to look for reasons of elevated creatinine. CT confirmed the mass and MRI raised the suspicion more  for renal cell carcinoma. He needs to see urology.    HTN  BP has been poorly controlled. Last time we stopped ACE-I as his creatinine was elevated to see if there would be some reversibility. It has not changed much although no BMP done since then (Creatinine was done as POC). He is currently taking amlodipine and we added coreg. He is struggling with taking BID meds and prefers to take once daily meds which also limits the options.     DM  Diabetes has been well controlled but it likley has contributed to his kidney function.     Objective:     BP (!) 178/112   Pulse 56   Temp 98.4 F (36.9 C) (Temporal)   Wt 209 lb (94.8 kg)   SpO2 100%   BMI 26.83 kg/m     Exam:    General: healthy, alert, no distress   Skin: Skin color, texture, turgor normal  Head: Normocephalic   Eyes: Lids/periorbital skin normal   Ears: External ears normal  Nose:normal   Neck: supple    Lungs: clear to auscultation   Heart: normal rate, regular rhythm and no murmurs   Back: Back symmetric, no deformity; ROM normal   Abd: soft, non-tender.    Supplemental Information:  Outpatient Prescriptions Marked  as Taking for the 05/11/17 encounter (Office Visit) with Fabio Neighbors, MD   Medication Sig Dispense Refill   . AmLODIPine Besylate 10 MG Oral Tab Take 1 tablet (10 mg) by mouth daily. 90 tablet 12   . Atorvastatin Calcium 40 MG Oral Tab      . Fluticasone Propionate 50 MCG/ACT Nasal Suspension   3   . MetFORMIN HCl 1000 MG Oral Tab once daily     . Sildenafil Citrate (VIAGRA) 100 MG Oral Tab Take 1 tablet (100 mg) by mouth daily as needed for erectile dysfunction. Do not take with other blood pressure lowering medications 30 tablet 3    ,   Patient Active Problem List   Diagnosis   . Controlled type 2 diabetes mellitus with microalbuminuria, without long-term current use of insulin ([place])   . History of squamous cell carcinoma of skin   . Marijuana abuse   . Mediastinal lymphadenopathy   . Pulmonary nodule, right   . Essential  hypertension   . CKD (chronic kidney disease) stage 3, GFR 30-59 ml/min (HCC)   . Proteinuria   . Other hyperlipidemia   , ,   Past Medical History:   Diagnosis Date   . Hypertension    . Type II or unspecified type diabetes mellitus without mention of complication, not stated as uncontrolled ([place])    ,   Past Surgical History:   Procedure Laterality Date   . left eye surgery x 5     . RPR INGUN HERNIA SLIDING ANY AGE      many years ago.  bilateral.   ,   Social History     Social History Narrative   . No narrative on file   ,   Social History   Substance Use Topics   . Smoking status: Current Some Day Smoker   . Smokeless tobacco: Never Used      Comment: occasional cigar   . Alcohol use Yes      Comment: beer Rarely     and   Family History     Problem (# of Occurrences) Relation (Name,Age of Onset)    Hypertension (1) Father    Osteoporosis (1) Mother

## 2017-05-11 NOTE — Telephone Encounter (Addendum)
Received the below message from the ordering provider regarding urgent Oncology referral. They would like the patient to be scheduled ASAP; so he can share the treatment plan during today's appointment. Called [facility]; the automated message said the wait was more than 30 minutes. Selected option to have them call me back at [phone].     Received a call back from [facility]. They advised that it will have to go through medical review first before scheduling the patient. I re-iterated the situation and urgency. They still advised that it will need to go through a medical review to find a right provider to schedule the patient with. I asked them to expedite the medical review process and reach out to the patient ASAP for an appointment.     FYI, Provider        ----- Message from Fabio Neighbors, MD sent at 05/11/2017 12:37 PM PST -----  Regarding: possible renal cancer, referral to urology ASAP  Hi,   Giovanni has possible renal cell carcinoma and he needs to see urology ASAP. I called today but could not make apt myself as they put me on hold for long time then disconnected. He is seeing me later this pm. Can you make sure he gets apt as soon as possible so we can share plan with him? Thanks   [place]

## 2017-05-11 NOTE — Progress Notes (Signed)
MRI suspecting renal cell carcinoma. Sending to urology in urgent appointment. I attempted to speak to the patient but only left a vm.

## 2017-05-11 NOTE — Progress Notes (Signed)
I have personally discussed the case with the resident during or immediately after the patient visit including review of history, physical exam, diagnosis, and treatment plan. Any additional comments are below or have been added to the above note. I agree with the assessment and plan of care. I have seen the patient during the visit.

## 2017-05-13 ENCOUNTER — Telehealth (INDEPENDENT_AMBULATORY_CARE_PROVIDER_SITE_OTHER): Payer: Self-pay | Admitting: Family Medicine

## 2017-05-13 NOTE — Telephone Encounter (Signed)
PA submitted via navinet.  Will check for response in 4 business days.  If no response at that time will call insurance to check status.      Navinet [place]: [id number]

## 2017-05-13 NOTE — Telephone Encounter (Signed)
Approval Received  Insurance:  [employer]   Medication: Sildenafil Citrate (VIAGRA) 100 MG Oral Tab         Dates Approved: 05/13/2017-05/13/2018   Prior Auth. Number: N/A     Additional Notes: Pharmacy informed    Excerpt of Approval

## 2017-05-13 NOTE — Telephone Encounter (Signed)
Received PA request for Sildenafil Citrate (VIAGRA) 100 MG Oral Tab.     Requesting Pharmacy: [facility]   Phone # [phone]   Fax # [phone]       Reason for PA: PA Required        Insurance Name: [facility]   Phone #    [place] # [phone] (PCN:[id number])(Group:[id number])   Member ID # [id number]

## 2017-05-17 ENCOUNTER — Encounter (INDEPENDENT_AMBULATORY_CARE_PROVIDER_SITE_OTHER): Payer: Self-pay | Admitting: Urology

## 2017-05-17 ENCOUNTER — Ambulatory Visit: Payer: No Typology Code available for payment source | Attending: Family Medicine | Admitting: Urology

## 2017-05-17 ENCOUNTER — Other Ambulatory Visit (HOSPITAL_BASED_OUTPATIENT_CLINIC_OR_DEPARTMENT_OTHER): Payer: Self-pay | Admitting: Cardiovascular Disease

## 2017-05-17 VITALS — BP 158/111 | HR 58 | Ht 74.0 in | Wt 200.0 lb

## 2017-05-17 DIAGNOSIS — N2889 Other specified disorders of kidney and ureter: Secondary | ICD-10-CM

## 2017-05-17 DIAGNOSIS — N183 Chronic kidney disease, stage 3 unspecified: Secondary | ICD-10-CM

## 2017-05-17 DIAGNOSIS — Z6825 Body mass index (BMI) 25.0-25.9, adult: Secondary | ICD-10-CM

## 2017-05-17 NOTE — Progress Notes (Signed)
I, Jonathan D Harper, MD, personally performed the services described in this documentation, as scribed by Leah Osnis in my presence, and it is both accurate and complete.

## 2017-05-17 NOTE — Progress Notes (Signed)
New Urology Consult    Referring Provider:  Al Achkar    Reason for visit/Chief Complaint:    Chief Complaint   Patient presents with   . New Patient       History of Present Illness: Sean Oliver is a 56 year old male who presents to me for left renal mass.    During follow-up for proteinuria he obtained a renal US. 04/28/2017 US demonstrated left simple cyst measuring 2.1 cm on the upper pole. In the midportion of the left kidney there is also a circumscribed hypoechoic area, with internal echoes, measuring 2.2 x 1.8 x 1.8 cm. Subsequent 05/04/2017 CT demonstrated 2.2 cm expansile enhancing lesion with rapid washout in interpolar region of the left kidney. Following 05/10/2017 MRI demonstrated 1.8 cm solid renal mass in left interpolar kidney .     He has high creatinine levels, most recently 1.5 on 05/10/2017. He is not being evaluated for kidney function long-term.    He denies abdominal pain currently. He has a history of incisional hernia. He has a history of diverticulitis and was treated with colectomy twice and once with mesh. Five feet of intestine were removed with no ostomy placed.    He had a recent cold. No past history of thyroid problems. He reports chronic lymph node enlargement.    Other medical problems include stage 3 CKD, hypertension and type II DM.    Past Medical Hx:    Past Medical History:   Diagnosis Date   . Hypertension    . Type II or unspecified type diabetes mellitus without mention of complication, not stated as uncontrolled [facility])        Patient Active Problem List   Diagnosis   . Controlled type 2 diabetes mellitus with microalbuminuria, without long-term current use of insulin ([place])   . History of squamous cell carcinoma of skin   . Marijuana abuse   . Mediastinal lymphadenopathy   . Pulmonary nodule, right   . Essential hypertension   . CKD (chronic kidney disease) stage 3, GFR 30-59 ml/min (HCC)   . Proteinuria   . Other hyperlipidemia       Past Surgical Hx:    Past  Surgical History:   Procedure Laterality Date   . left eye surgery x 5     . RPR INGUN HERNIA SLIDING ANY AGE      many years ago.  bilateral.   5 ft of intestine removed - colectomy x2 for diverticulitis - no ostomy     Past Family and Social Hx:   family history includes Hypertension in his father; Osteoporosis in his mother.Marland Kitchen He  reports that he has been smoking.  He has never used smokeless tobacco. He reports that he drinks alcohol. He reports that he does not use drugs..  Social History     Social History Narrative   . No narrative on file   lives in [place]: Comprehensive ROS was performed and reviewed by me.  See chart for full questionnaire scanned into chart from today.      Active Meds:    Outpatient Prescriptions Marked as Taking for the 05/17/17 encounter (Office Visit) with Edilia Bo, MD   Medication Sig Dispense Refill   . AmLODIPine Besylate 10 MG Oral Tab Take 1 tablet (10 mg) by mouth daily. 90 tablet 12   . Atorvastatin Calcium 40 MG Oral Tab      . Fluticasone Propionate 50 MCG/ACT  Nasal Suspension   3   . MetFORMIN HCl 1000 MG Oral Tab once daily     . Metoprolol Succinate ER 25 MG Oral TABLET SR 24 HR Take 2 tablets (50 mg) by mouth daily. Do not chew or crush. 60 tablet 3   . Sildenafil Citrate (VIAGRA) 100 MG Oral Tab Take 1 tablet (100 mg) by mouth daily as needed for erectile dysfunction. Do not take with other blood pressure lowering medications 30 tablet 3       Allergies:    Allergies as of 05/17/2017   . (No Known Allergies)       Physical Exam:    BP (!) 158/111   Pulse 58   Ht 6\' 2"  (1.88 m)   Wt 200 lb (90.7 kg)   BMI 25.68 kg/m   General:  No apparent distress, well-nourished  Neuro:  Socially appropriate, normal gait, alert & oriented x3, normal affect  Head: atraumatic and normocephalic  Neck: No masses or bilateral submandibular lymphadenopathy (current URI)  Chest:  No audible wheezing    Cardiovascular:  Heart regular rate , palpable  pulses bilaterally  Abdomen:  Soft, nontender, nondistended, large midline incision, no hernia  Back:  No CVA tenderness. 3 cm midline incision scar  Extremities:  No peripheral edema or cyanosis    Labs:    Cr 1.5 --05/10/2017     Radiology: I reviewed imaging including:    05/10/2017 MRI:   IMPRESSION:  1. Redemonstration of solid renal mass in the left interpolar kidney measuring  1.8 cm. Characteristics suggest papillary or chromophobe type renal cell   carcinoma, less likely oncocytoma or metastasis from primary malignancy   elsewhere.  2. No evidence of metastasis or renal vein invasion.    05/04/2017 CT:   IMPRESSION:  1. In the interpolar region of the left kidney, there is a 2.2 cm expansile   enhancing lesion with rapid washout, which is concerning for renal cell   carcinoma. No filling defects are noted in the opacified portions of the   collecting system.    04/28/2017 [place] Retroperitoneal:  IMPRESSION:   1. A focal left renal lesion is noted, not a simple cyst, possibly a solid   mass although nonspecific appearance. This is about 2 cm in size. Further   evaluation using multiphasic contrast-enhanced CT is suggested.  2. Diffuse thickening of bladder wall, nonspecific, recommend clinical   correlation as regards bladder wall hypertrophy from outlet obstruction or   cystitis for example.      ASSESSMENT/PLAN:    Sean Oliver is a 56 year old male with left renal mass.     I had a long discussion with Sean Oliver regarding the nature of renal tumors including the 80% likelihood that this represents a renal cell carcinoma.     We discussed the finding of a renal mass in detail.  We discussed the natural history of renal masses and the probability that it could be a malignant lesion.  We discussed the role and limitations of renal mass biopsy. We discussed treatment options, which include active surveillance, excisional therapy in the form of partial or radical nephrectomy or ablative  therapy in the form of cryoablation or radiofrequency ablation. We discussed he would be amenable to minimally invasive robotic partial nephrectomy. We discussed the likelihood of metastasis in 5 years is 1-2%.     We discussed the risks, benefits, and alternatives to proceeding with active surveillance for this renal mass. We discussed  that active surveillance for small renal masses has been shown to be safe in terms of inordinately low rates of metastatic progression, and in terms of preservation of candidacy for nephron-sparing surgery. We discussed that our protocol for active surveillance includes serial imaging to evaluate for growth of the renal mass.     I drew a diagram explaining anatomy and surgery details. We discussed that, for surgery for a renal mass the first consideration is whether or not the renal mass is amenable to nephron-sparing surgery. We discussed that the renal mass we are reviewing is amenable to nephron-sparing surgery such as with partial nephrectomy for the following reasons: size and location. We discussed that this surgery could be accomplished through minimally-invasive techniques such as with robot-assisted or laparoscopic techniques.     We discussed if he would like to proceed with active surveillance, then he will RTC in 4 months with repeat imaging. He is leaning towards partial nephrectomy and wants to evaluate his hypertension with PCP before making a decision. He will obtain chest XR.  He will consider treatment options and contact the clinic with treatment decision.     Thank you for this referral.    05/17/2017 @ 11:26 AM - I, Leah A Osnis, Medical Scribe acted as a Education administrator and documented the service/procedure performed to the best of my knowledge in the presence of Vonita Moss, MD who will provide the final review and authentication.  Signed: Darolyn Rua, Medical Scribe

## 2017-05-17 NOTE — Progress Notes (Signed)
This man has uncontrolled hypertension as well as DM, CKD with proteinuria on low dose medication. He should be referred to nephrology for management.

## 2017-05-18 ENCOUNTER — Telehealth (INDEPENDENT_AMBULATORY_CARE_PROVIDER_SITE_OTHER): Payer: Self-pay | Admitting: Family Medicine

## 2017-05-18 NOTE — Telephone Encounter (Signed)
Patient needs to be seen to discuss high blood pressure treatment and sending to nephrology now that he has already seen the urology regarding his kidney mass. Please call and make apt in earlier available, ok to double book for 15 min if needed.

## 2017-05-19 NOTE — Telephone Encounter (Signed)
Called and spoke to patient.    He has been schedule with Al Achkar for tomorrow 12/6 at 1:00pm.    Closing TE

## 2017-05-20 ENCOUNTER — Encounter (INDEPENDENT_AMBULATORY_CARE_PROVIDER_SITE_OTHER): Payer: Self-pay | Admitting: Family Medicine

## 2017-05-20 ENCOUNTER — Ambulatory Visit (INDEPENDENT_AMBULATORY_CARE_PROVIDER_SITE_OTHER): Payer: No Typology Code available for payment source | Admitting: Family Medicine

## 2017-05-20 VITALS — BP 164/104 | HR 61 | Wt 200.0 lb

## 2017-05-20 DIAGNOSIS — I10 Essential (primary) hypertension: Secondary | ICD-10-CM

## 2017-05-20 DIAGNOSIS — C641 Malignant neoplasm of right kidney, except renal pelvis: Secondary | ICD-10-CM

## 2017-05-20 DIAGNOSIS — N189 Chronic kidney disease, unspecified: Secondary | ICD-10-CM

## 2017-05-20 NOTE — Progress Notes (Signed)
Reason for Visit:   Chief Complaint   Patient presents with   . Follow-Up    . Blood Pressure         Refills? NO  Referral? NO  Letter or Form? NO  Lab Results? NO    HEALTH MAINTENANCE:  Has the patient had this done since their last visit?  Cervical screening/PAP: N/A  Mammo: N/A  Colon Screen: N/A    Have you seen a specialist since your last visit: No    Vaccines Due? Yes, Shingrix    HM Due:   Health Maintenance   Topic Date Due   . Diabetes Eye Exam  02/25/1981   . Colorectal Cancer Screening (FOBT/FIT)  02/25/2013   . Zoster Vaccine (1 of 2) 02/25/2013   . Diabetes A1c  10/06/2017   . Depression Screening (PHQ-2)  03/04/2018   . Diabetes Foot Exam  04/07/2018   . Tetanus Vaccine  02/07/2021   . Lipid Disorders Screening  04/07/2022   . Influenza Vaccine  Completed   . Hepatitis C Screening  Completed   . HIV Screening  Completed       PCP Verified?  Yes, Maryla Morrow, MD

## 2017-05-21 ENCOUNTER — Telehealth (HOSPITAL_BASED_OUTPATIENT_CLINIC_OR_DEPARTMENT_OTHER): Payer: Self-pay | Admitting: Family Medicine

## 2017-05-21 MED ORDER — LISINOPRIL 10 MG OR TABS
10.0000 mg | ORAL_TABLET | Freq: Every day | ORAL | 5 refills | Status: DC
Start: 2017-05-21 — End: 2017-07-06

## 2017-05-21 NOTE — Progress Notes (Signed)
Sean Oliver is a 56 year old male who presents today for elevated BP. -- this visit was primarily done with the patient walking out and through a phone call I made after. Patient was in a rush to leave to pick up daughter.       Assessment and Plan:    1. Hypertension, unspecified type  2. Chronic kidney disease, unspecified CKD stage  3. Renal cell carcinoma (likely).  My initial intention was to restart lisinorpl and send to nephrology. Patient prefers to see nephrology first. I let them know if they change their mind and decide to start the lisinopril, they should get the BMP done in a week or so after. Initially, we advised to hold off lisinopril to see if the kidney function was truly chronic or acute. But it has been stable and now I believe they could benefit from lisinopril but they, Sean Oliver and his wife Sean Oliver, are skeptical. I hope they can discuss this further with nephrology.     ---    Follow up: in 1-2 weeks.    Subjective:     History of Present Illness:     Limited interaction in person due to patient having to pick up children    HTN  Elevated today as well.     CKD  Needs referral to nephrology    Renal mass  likely renal cell carcinoma. Seen by urology.     Objective:     BP (!) 164/104   Pulse 61   Wt 200 lb (90.7 kg)   SpO2 99%   BMI 25.68 kg/m     Exam:    Limited due to patient having to leave to pick up children.

## 2017-05-21 NOTE — Telephone Encounter (Signed)
(  TEXTING IS AN OPTION FOR [facility] ONLY)  Is this a [place] clinic? No      RETURN CALL: Detailed message on voicemail only      SUBJECT:  Appointment Request     REASON FOR REQUEST/SYMPTOMS: Hypertension,Chronic kidney disease  REFERRING PROVIDER: Fabio Neighbors, MD  REQUEST APPOINTMENT WITH: any  REQUESTED DATE: any day but Wednesday, TIME: any time after 10 am   UNABLE TO APPOINT BECAUSE: Referral marked as urgent and no scheduling instructions available.

## 2017-05-22 ENCOUNTER — Encounter (INDEPENDENT_AMBULATORY_CARE_PROVIDER_SITE_OTHER): Payer: Self-pay | Admitting: Medical

## 2017-05-24 ENCOUNTER — Telehealth (HOSPITAL_BASED_OUTPATIENT_CLINIC_OR_DEPARTMENT_OTHER): Payer: Self-pay | Admitting: Nephrology

## 2017-05-24 NOTE — Telephone Encounter (Signed)
Spoke with Jeneen Rinks, provided my direct call back number to check in on the status of the referral--pt understands that the referral is still in medical review and that I should be getting it back from the reviewing provider by tomorrow morning.

## 2017-05-24 NOTE — Telephone Encounter (Signed)
(  TEXTING IS AN OPTION FOR [facility] ONLY)  Is this a [place] clinic? No      RETURN CALL: Detailed message on voicemail only      SUBJECT:  Appointment Request     REASON FOR REQUEST/SYMPTOMS: Per referral  REFERRING PROVIDER: Morhaf Al Achkar  REQUEST APPOINTMENT WITH: Any  REQUESTED DATE: Please call, TIME: Please call  UNABLE TO APPOINT BECAUSE: Urgent referral, no scheduling instructions, CCR attempted to transfer patient but care team was assisting other patients.

## 2017-05-26 ENCOUNTER — Telehealth (HOSPITAL_BASED_OUTPATIENT_CLINIC_OR_DEPARTMENT_OTHER): Payer: Self-pay | Admitting: Nephrology

## 2017-05-26 NOTE — Telephone Encounter (Signed)
(  TEXTING IS AN OPTION FOR [facility] ONLY)  Is this a [place] clinic? No      RETURN CALL: General message on voicemail only      SUBJECT:  Appointment Request     REASON FOR REQUEST/SYMPTOMS: I10 (ICD-10-CM) - 401.9 (ICD-9-CM) - Hypertension, unspecified type; N18.9 (ICD-10-CM) - 585.9 (ICD-9-CM) - Chronic kidney disease, unspecified CKD stage  REFERRING PROVIDER: Dr. Esmeralda Arthur Achkar  REQUEST APPOINTMENT WITH: Any available provider  REQUESTED DATE: As soon as possible (referral is marked URGENT, TIME: Please discuss with the patient   UNABLE TO APPOINT BECAUSE: Per your clinic's SOP for Urgent Referral

## 2017-05-27 ENCOUNTER — Encounter (INDEPENDENT_AMBULATORY_CARE_PROVIDER_SITE_OTHER): Payer: Self-pay | Admitting: Family Medicine

## 2017-05-27 NOTE — Telephone Encounter (Signed)
Generic response sent. Routing to Dr. Rogue Bussing to please advise on pt's nephrology and high BP concerns. Thank you.

## 2017-05-28 ENCOUNTER — Ambulatory Visit: Payer: No Typology Code available for payment source | Attending: Nephrology | Admitting: Nephrology

## 2017-05-28 ENCOUNTER — Encounter (HOSPITAL_BASED_OUTPATIENT_CLINIC_OR_DEPARTMENT_OTHER): Payer: Self-pay | Admitting: Nephrology

## 2017-05-28 VITALS — BP 159/110 | HR 58 | Temp 98.1°F | Ht 74.02 in | Wt 213.0 lb

## 2017-05-28 DIAGNOSIS — Z6827 Body mass index (BMI) 27.0-27.9, adult: Secondary | ICD-10-CM

## 2017-05-28 DIAGNOSIS — E1129 Type 2 diabetes mellitus with other diabetic kidney complication: Secondary | ICD-10-CM | POA: Insufficient documentation

## 2017-05-28 DIAGNOSIS — I10 Essential (primary) hypertension: Secondary | ICD-10-CM

## 2017-05-28 DIAGNOSIS — N183 Chronic kidney disease, stage 3 unspecified: Secondary | ICD-10-CM

## 2017-05-28 DIAGNOSIS — R809 Proteinuria, unspecified: Secondary | ICD-10-CM | POA: Insufficient documentation

## 2017-05-28 LAB — URINALYSIS COMPLETE, URN
Bacteria, URN: NONE SEEN
Bilirubin (Qual), URN: NEGATIVE
Epith Cells_Renal/Trans,URN: NEGATIVE /HPF
Glucose Qual, URN: NEGATIVE mg/dL
Ketones, URN: NEGATIVE mg/dL
Leukocyte Esterase, URN: NEGATIVE
Nitrite, URN: NEGATIVE
RBC, URN: NEGATIVE /HPF
Specific Gravity, URN: 1.021 g/mL (ref 1.002–1.027)
WBC, URN: NEGATIVE /HPF

## 2017-05-28 LAB — PROTEIN/CREATININE RATIO, TIMED URINE
Creatinine/24H, Urine: UNDETERMINED mg/(24.h) (ref 1000–2000)
Creatinine/Unit, Urine: 217 mg/dL
Protein (Total), Urine: 207 mg/dL
Protein/Creatinine Ratio: 1 — ABNORMAL HIGH (ref <0.2)
Total Protein/24H, Urine: UNDETERMINED g/d (ref 0.05–0.08)

## 2017-05-28 NOTE — Progress Notes (Signed)
NEPHROLOGY INITIAL CLINIC CONSULTATION NOTE        REASON FOR CONSULTATION: Increased Cr and Proteinuria.     REFERRING PHYSICIAN:   Morhaf Al Achkar     CONSULT DATE: 05/28/2017      Past Medical History:   Diagnosis Date   . Cancer ([place])    . Diabetes mellitus ([place])    . Hypertension    . Kidney disease    . Lipidemia    . Lung disease    . Type II or unspecified type diabetes mellitus without mention of complication, not stated as uncontrolled [facility])      Past Surgical History:   Procedure Laterality Date   . left eye surgery x 5     . RPR INGUN HERNIA SLIDING ANY AGE      many years ago.  bilateral.        HISTORY OF PRESENT ILLNESS: Mr. Biehn is a 56 year old male with past medical history significant for the above mentioned problems.    Patient presents for his initial renal clinic consultation visit in the company of his wife.  Based on verbal report, past medical history is significant for:    Diabetes mellitus of at least 15 years duration, though has been very well controlled over this period of time.  He was initially started off on insulin but subsequently has been switched to metformin only with good glycemic control.  By his report, he does not know of any end organ damage including absence of any diabetic retinopathy or neuropathy.  Also has long-standing hypertension of at least 20 years duration.  While he has not checked his pressures regularly at home, he reports that for the most part at doctors visits he was told that the blood pressure is only marginally elevated.  Prolonged time he was on combination lisinopril with hydrochlorothiazide, that was changed subsequently to alternative agents.  One reasoning for stopping lisinopril was to determine if it would lead to an improvement in creatinine/GFR.  However, over the last few months patient has been experiencing uncontrolled hypertension with most readings in the 160s-170s/90s-100 range.  He is not specifically expressing any symptoms related  to these uncontrolled readings and denies any history of TIAs/CVAs.  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  Also had considerable issues with diverticulitis leading to laparotomy with limited resection as well in the past.    During the recent follow-ups with primary care physician and in effort to undertake a diagnostic evaluation for his chronic kidney disease, both urinary and imaging studies were undertaken.  Urinalyses now revealing increased albuminuria while imaging studies including ultrasound, CT scan and MRI also indicating around 2 cm left-sided interpolar mass most compatible with renal cell carcinoma.  He has not experienced any specific flank pains, dysuria, renal colic or hematuria.  Similarly no major change in urinary habit and no problems with dependent edema.    No history of nephrolithiasis, recurrent urinary tract infections or pyelonephritis.  Only occasional use of NSAIDs with no over-the-counter/herbal supplement use.    Otherwise, patient denies any acute problems and a detailed review of systems was performed as below.    REVIEW OF SYSTEMS:  Constitutional: No fever, chills or malaise  Eyes: No vision disturbance  Ears/Nose/Throat: No sore throat or bleeding  Respiratory: No cough, sputum or shortness of breath at rest  Cardiovascular: No chest discomfort, dyspnea on exertion  Gastrointestinal: No abdominal pain,  nausea, vomiting or diarrhea  Genitourinary: No dysuria or frequency  Hematologic/lymphatic: No bleeding  Musculoskeletal: No arthralgias  Neurologic: No headache, dizziness, seizures, loss of consciousness or tremors  Psychiatric: No anxiety or depression  Allergic/Immunologic: No running nose or recent allergies       FAMILY HISTORY:    Family History     Problem (# of Occurrences) Relation (Name,Age of Onset)    Hypertension (1) Father    Osteoporosis (1) Mother          SOCIAL  HISTORY:    Social History     Social History   . Marital status: Married     Spouse name: N/A   . Number of children: N/A   . Years of education: N/A     Occupational History   . Not on file.     Social History Main Topics   . Smoking status: Former Smoker     Quit date: 05/29/2015   . Smokeless tobacco: Never Used      Comment: occasional cigar   . Alcohol use Yes      Comment: rarely   . Drug use: No   . Sexual activity: Yes     Partners: Female      Comment: Married 20 yrs      Other Topics Concern   . Not on file     Social History Narrative    Married. Lives in [place].       I personally reviewed and confirmed the ROS, past medical history, past surgical history, family history and social history in the record with spouse and patient today.     ALLERGIES:    Review of patient's allergies indicates:  No Known Allergies    HOME MEDICATIONS:      Current Outpatient Prescriptions:   .  AmLODIPine Besylate 10 MG Oral Tab, Take 1 tablet (10 mg) by mouth daily., Disp: 90 tablet, Rfl: 12  .  Atorvastatin Calcium 40 MG Oral Tab, , Disp: , Rfl:   .  Fluticasone Propionate 50 MCG/ACT Nasal Suspension, , Disp: , Rfl: 3  .  Lisinopril 10 MG Oral Tab, Take 1 tablet (10 mg) by mouth daily., Disp: 30 tablet, Rfl: 5  .  MetFORMIN HCl 1000 MG Oral Tab, once daily, Disp: , Rfl:   .  Metoprolol Succinate ER 25 MG Oral TABLET SR 24 HR, Take 2 tablets (50 mg) by mouth daily. Do not chew or crush., Disp: 60 tablet, Rfl: 3  .  Sildenafil Citrate (VIAGRA) 100 MG Oral Tab, Take 1 tablet (100 mg) by mouth daily as needed for erectile dysfunction. Do not take with other blood pressure lowering medications, Disp: 30 tablet, Rfl: 3        PHYSICAL EXAM:     Vitals Signs: BP (!) 159/110   Pulse 58   Temp 98.1 F (36.7 C) (Temporal)   Ht 6' 2.02" (1.88 m)   Wt 212 lb 15.4 oz (96.6 kg)   SpO2 99% Comment: ra  BMI 27.33 kg/m .    Constitutional: Sitting comfortably in chair, in no apparent distress.  Eyes:  Pupils normal size, EOM  intact  Mouth/Throat: Mucous membranes are moist,  no oropharyngeal lesions  Neck: No JVD or carotid bruits  Lungs: No rhonchi or creptitus  Heart: S1, S2 with II/VI ESM, no rubs/ gallops  Abdomen: Soft, nontender; bowel sounds normal  Extremities: No pretibial or pedal edema,    Neuro: Alert O X3, no focal deficiits  Skin: No rash        DIAGNOSTIC STUDIES:  Office Visit on 05/28/17   1. Urinalysis Complete, URN   Result Value Ref Range    Color, URN Yellow     Clarity, URN Clear     Specific Gravity, URN 1.021 1.002 - 1.027 g/mL    pH, URN < or =5.0 5.0 - 8.0    Protein (Alb Semiquant), URN 100-299(2+) (A) NRN mg/dL    Glucose Qual, URN Negative NRN mg/dL    Ketones, URN Negative NRN mg/dL    Bilirubin (Qual), URN Negative NRN    Occult Blood, URN Small(1+) (A) NRN    Nitrite, URN Negative NRN    Leukocyte Esterase, URN Negative NRN    Urobilinogen, URN 2.9-5.6 URONML [[place]'U]    Comments for Macroscopic, URN None     WBC, URN 0-5(NEG) Z5NEG /[HPF]    RBC, URN 0-2(NEG) Z2NEG /[HPF]    Bacteria, URN Not Seen NOSEEN    Epith Cells_Squamous, URN >5(PRESENT) (A) LT6 /[LPF]    Epith Cells_Renal/Trans,URN <3(NEG) OZHY8 /[HPF]    Comments For Microscopic, URN None NONE    Mucus, URN Present (A) NOSEEN /[LPF]   2. URINE PROTEIN/CREATININE RATIO   Result Value Ref Range    Protein/Creat Ratio Int, URN Random h    Protein/Creat Ratio Volume,URN Random mL    Protein (Total), URN  mg/dL    Total Protein/24H, URN  0.05 - 0.08 g/d    Creatinine/Unit, URN 217 mg/dL    Creatinine/24H, URN Unable to calculate value on a random specimen. 1000 - 2000 mg/(24.h)    Protein/Creatinine Ratio  <0.2        Results for ASON, HESLIN (MRN [id number]) as of 05/28/2017 16:20   Ref. Range 04/19/2017 11:01   Sodium Latest Ref Range: 135 - 145 meq/L 141   Potassium Latest Ref Range: 3.6 - 5.2 meq/L 3.8   Chloride Latest Ref Range: 98 - 108 meq/L 106   Carbon Dioxide, Total Latest Ref Range: 22 - 32 meq/L 29   Anion Gap Latest Ref Range:  4 - 12  6   Glucose Latest Ref Range: 62 - 125 mg/dL 102   Urea Nitrogen Latest Ref Range: 8 - 21 mg/dL 19   Creatinine Latest Ref Range: 0.51 - 1.18 mg/dL 1.61 (H)   GFR, Calc, European American Latest Ref Range: >59 mL/min/1.73_m2 45 (L)   GFR, Calc, African American Latest Ref Range: >59 mL/min/1.73_m2 54 (L)   GFR, Information Unknown Calculated GFR in...   Calcium Latest Ref Range: 8.9 - 10.2 mg/dL 9.2       Renal US 04/28/17:  The kidneys are normal in size.   There appears to be a slight diffuse increase in renal echogenicity as compared to the liver, raising question of diffuse medical renal disease.  There is no evidence of mass or cyst on the right.  On the left, there is a simple cyst measuring 2.1 cm on the upper pole. In the midportion of the left kidney there is also a circumscribed hypoechoic area, with internal echoes, measuring 2.2 x 1.8 x 1.8 cm.  No ascites.     Urinary bladder: Moderately full, measuring 7.3 x 6.6 x 6.9 cm. Also a slight prominent bladder wall thickness diffusely, nonspecific. Prostate is subjectively large.    MRI 05/10/17:  Right kidney: Subcentimeter scattered renal cortical cysts.  Left kidney: Again seen is a mass in the interpolar region of the left kidney (801-2/45) which measures  1.8 x 1.6 cm and demonstrates enhancement less than that of renal parenchyma, T2 signal slightly hypointense to renal parenchyma,   and demonstrates diffusion restriction. No loss of signal on opposed phase images to suggest fatty components. Renal cortical cysts are present.  Renal vasculature: Single bilateral renal arteries and no renal venous tumor invasion.  Visualized ureters: Normal.      ASSESSMENT AND PLAN:    -Chronic Kidney Disease Stage 3  Review of available labs since at least 2015 show a creatinine ranging from 1.27-1.61 (and one value obtained through [facility] from 04/28/1996 of 1.61m/dL).  This indicates long-standing element of chronic kidney disease with eGFR likely in  the ~ 55 mL/minute range albeit a more or less stable course over this period of time with minimal progression.  While current albuminuria is around 1 g, his chronic kidney disease appears possibly from hypertensive nephrosclerosis (corroborated by findings of LVH on ECHO as well), some parenchymal loss from cysts with likely less of an element of diabetic nephropathy.  Ultrasound of the kidneys is instructive in this regard indicating increased echogenicity from interstitial fibrosis.  The recent worsening of albuminuria (10/2014 753mg to 1g now) may also be from stopping lisinopril and/or increasing fibrosis.  Primary care physician has already ruled out a monoclonal disorder with urine and serum electrophoresis studies as well as free light chain assays.  At this juncture, we will hold off on ordering additional serology and determine response to reinitiating lisinopril.  With regards to need for kidney biopsy, if patient undergoes partial nephrectomy for presumptive renal cell carcinoma in his left kidney, will review the kidney tissue for any primary renal pathology.  Needs continued aggressive risk factor modification.  Also educated regarding low salt, low protein diet to help slow down the rate of progression.    -Hypertension, Primary  Recently uncontrolled readings with current regimen including amlodipine 10 mg daily and metoprolol 50 mg daily.  Given its salutary effect on proteinuria and interstitial fibrosis as well as more or less stable GFR over long period of time, will resume lisinopril and agree with starting at 10 mg daily for now.  He will likely require further titration in his dosages and/or medication regimen for optimal control.  Will check a renal panel in 2 weeks to determine effect on creatinine/K, in order to make further dose adjustments and lisinopril.    -Diabetes mellitus type 2, controlled  Good control of diabetes mellitus with last hemoglobin A1c of 6.4% and self-reported absence  of any diabetic retinopathy or neuropathy despite the long vintage.  Will resume lisinopril for better blood pressure control and attempt to escalate dose for maximum anti-proteinuric effect.    -Renal cell carcinoma   Imaging studies showing a 2 cm lesion in the interpolar region of the left kidney, likely renal cell carcinoma.  He has met with Dr. HaJodi Mourningn urology clinic where pros and cons of conservative versus surgical therapy were discussed.  Patient is to follow-up with urology clinic regarding further management strategies.        Thank you for allowing us[place]oreao participate in the care of Mr. JoKorver        MaJohnathan HausenMD, 05/28/2017 4:13 PM  Division of Nephrology  [facility]

## 2017-06-02 NOTE — Telephone Encounter (Signed)
Patient saw nephrology who recommended restarting lisinopril 10mg . Agree with recommendations. See eCare to patient.

## 2017-06-28 ENCOUNTER — Encounter (INDEPENDENT_AMBULATORY_CARE_PROVIDER_SITE_OTHER): Payer: Self-pay | Admitting: Family Medicine

## 2017-07-05 ENCOUNTER — Ambulatory Visit (INDEPENDENT_AMBULATORY_CARE_PROVIDER_SITE_OTHER): Payer: No Typology Code available for payment source | Admitting: Family Medicine

## 2017-07-05 ENCOUNTER — Encounter (INDEPENDENT_AMBULATORY_CARE_PROVIDER_SITE_OTHER): Payer: Self-pay | Admitting: Family Medicine

## 2017-07-05 VITALS — BP 155/99 | HR 63 | Temp 98.1°F | Wt 215.0 lb

## 2017-07-05 DIAGNOSIS — I10 Essential (primary) hypertension: Secondary | ICD-10-CM

## 2017-07-05 DIAGNOSIS — Z6827 Body mass index (BMI) 27.0-27.9, adult: Secondary | ICD-10-CM

## 2017-07-05 NOTE — Progress Notes (Signed)
SUBJECTIVE    Sean Oliver is a 57 year old male who presents today with the following issue(s):    Patient presents today with his wife for follow up on hypertension.  His blood pressure remains quite elevated.  He was seen by Dr. Wynell Balloon, [facility], who advised him to start Lisinopril as per his last visit with Dr. Gregor Hams and recommended he get a blood test about a week afterwards which he did not do.      Patient Active Problem List   Diagnosis   . Controlled type 2 diabetes mellitus with microalbuminuria, without long-term current use of insulin ([place])   . History of squamous cell carcinoma of skin   . Marijuana abuse   . Mediastinal lymphadenopathy   . Pulmonary nodule, right   . Essential hypertension   . CKD (chronic kidney disease) stage 3, GFR 30-59 ml/min (HCC)   . Proteinuria   . Other hyperlipidemia       OBJECTIVE    BP (!) 155/99   Pulse 63   Temp 98.1 F (36.7 C) (Temporal)   Wt 215 lb (97.5 kg)   PF 100 L/min   BMI 27.59 kg/m     General: no acute distress      ASSESSMENT & PLAN    1. Essential hypertension  Blood pressure is still quite high.  Will check labs as recommended by Dr. Wynell Balloon.  Additionally, the patient states he would like to establish care with a different PCP.  I will contact him with results and may recommend increasing Lisinopril further.  I asked him to follow up in 1-2 weeks for a blood pressure recheck and to continue titration.  - Renal Function Panel  - Protein/Creatinine Ratio, Urine

## 2017-07-05 NOTE — Progress Notes (Signed)
Reason for Visit:   Chief Complaint   Patient presents with   . Follow-Up    . Blood Pressure         Refills? NO  Referral? NO  Letter or Form? NO  Lab Results? NO    HEALTH MAINTENANCE:  Has the patient had this done since their last visit?  Cervical screening/PAP: N/A  Mammo: N/A  Colon Screen: N/A    Have you seen a specialist since your last visit: No    Vaccines Due? Yes, Shingrix    HM Due:   Health Maintenance   Topic Date Due   . Diabetes Eye Exam  02/25/1981   . Colorectal Cancer Screening (FOBT/FIT)  02/25/2013   . Zoster Vaccine (1 of 2) 02/25/2013   . Diabetes A1c  10/06/2017   . Depression Screening (PHQ-2)  03/04/2018   . Diabetes Foot Exam  04/07/2018   . Tetanus Vaccine  02/07/2021   . Lipid Disorders Screening  04/07/2022   . Influenza Vaccine  Completed   . Hepatitis C Screening  Completed   . HIV Screening  Completed       PCP Verified?  Yes, Maryla Morrow, MD    Patient states bp was 164/80 x1 week at Dentist appt.

## 2017-07-06 ENCOUNTER — Other Ambulatory Visit (INDEPENDENT_AMBULATORY_CARE_PROVIDER_SITE_OTHER): Payer: Self-pay | Admitting: Family Medicine

## 2017-07-06 DIAGNOSIS — N189 Chronic kidney disease, unspecified: Secondary | ICD-10-CM

## 2017-07-06 DIAGNOSIS — I10 Essential (primary) hypertension: Secondary | ICD-10-CM

## 2017-07-06 LAB — RENAL FUNCTION PANEL
Albumin: 4 g/dL (ref 3.5–5.2)
Anion Gap: 7 (ref 4–12)
Calcium: 9.6 mg/dL (ref 8.9–10.2)
Carbon Dioxide, Total: 30 meq/L (ref 22–32)
Chloride: 107 meq/L (ref 98–108)
Creatinine: 1.51 mg/dL — ABNORMAL HIGH (ref 0.51–1.18)
GFR, Calc, African American: 59 mL/min/{1.73_m2} — ABNORMAL LOW (ref >59)
GFR, Calc, European American: 48 mL/min/{1.73_m2} — ABNORMAL LOW (ref >59)
Glucose: 117 mg/dL (ref 62–125)
Phosphate: 3.5 mg/dL (ref 2.5–4.5)
Potassium: 3.9 meq/L (ref 3.6–5.2)
Sodium: 144 meq/L (ref 135–145)
Urea Nitrogen: 18 mg/dL (ref 8–21)

## 2017-07-06 LAB — PROTEIN/CREATININE RATIO, TIMED URINE
Creatinine/24H, Urine: UNDETERMINED mg/(24.h) (ref 1000–2000)
Creatinine/Unit, Urine: 165 mg/dL
Protein (Total), Urine: 170 mg/dL
Protein/Creatinine Ratio: 1 — ABNORMAL HIGH (ref <0.2)
Total Protein/24H, Urine: UNDETERMINED g/d (ref 0.05–0.08)

## 2017-07-06 MED ORDER — LISINOPRIL 20 MG OR TABS
20.0000 mg | ORAL_TABLET | Freq: Every day | ORAL | 5 refills | Status: DC
Start: 2017-07-06 — End: 2017-09-16

## 2017-07-22 ENCOUNTER — Encounter (INDEPENDENT_AMBULATORY_CARE_PROVIDER_SITE_OTHER): Payer: Self-pay | Admitting: Family Medicine

## 2017-07-22 ENCOUNTER — Ambulatory Visit (INDEPENDENT_AMBULATORY_CARE_PROVIDER_SITE_OTHER): Payer: No Typology Code available for payment source | Admitting: Family Medicine

## 2017-07-22 VITALS — BP 134/90 | HR 67 | Temp 98.1°F | Resp 16 | Wt 210.0 lb

## 2017-07-22 DIAGNOSIS — R911 Solitary pulmonary nodule: Secondary | ICD-10-CM

## 2017-07-22 DIAGNOSIS — I10 Essential (primary) hypertension: Secondary | ICD-10-CM

## 2017-07-22 DIAGNOSIS — E1129 Type 2 diabetes mellitus with other diabetic kidney complication: Secondary | ICD-10-CM

## 2017-07-22 DIAGNOSIS — N183 Chronic kidney disease, stage 3 unspecified: Secondary | ICD-10-CM

## 2017-07-22 DIAGNOSIS — Z85828 Personal history of other malignant neoplasm of skin: Secondary | ICD-10-CM

## 2017-07-22 DIAGNOSIS — R809 Proteinuria, unspecified: Secondary | ICD-10-CM

## 2017-07-22 DIAGNOSIS — R590 Localized enlarged lymph nodes: Secondary | ICD-10-CM

## 2017-07-22 NOTE — Progress Notes (Signed)
Chief Complaint   Patient presents with   . Blood Pressure     4 week f/u, pt states he checks his bp once a week, "seems to be coming down a bit". last reading was 164/100       Subjective:     Teron Blais is a 57 year old male who presents on 07/22/2017 for the following concerns:     HTN: lisinopril increased from 10 > 20 at last visit. Denies chest pain, shortness of breath, HA.    DMII: last A1C 6.4 in 10/18.   RCC: has apt coming up with urology, was told surgery would be deferred until BP controlled.    I reviewed the patient's recorded medical history, and confirmed the following: medications, problem list, allergies, past medical history, past social history, family history.    Review of Systems   Constitutional: Negative for chills, fever and weight loss.   Respiratory: Negative for cough, hemoptysis, shortness of breath and wheezing.    Cardiovascular: Negative for chest pain, palpitations and leg swelling.   Gastrointestinal: Negative for abdominal pain, blood in stool, heartburn, melena, nausea and vomiting.   Genitourinary: Negative for dysuria, flank pain and hematuria.       Objective:   Vitals: BP 134/90   Pulse 67   Temp 98.1 F (36.7 C) (Temporal)   Resp 16   Wt 210 lb (95.3 kg)   SpO2 97%   BMI 26.95 kg/m    Physical Exam   Constitutional: He appears well-developed and well-nourished. No distress.   HENT:   Head: Normocephalic and atraumatic.   Cardiovascular: Normal rate, regular rhythm and normal heart sounds.  Exam reveals no gallop and no friction rub.    No murmur heard.  Pulmonary/Chest: Effort normal and breath sounds normal. No respiratory distress. He has no wheezes. He has no rales.   Abdominal: Soft. Bowel sounds are normal. He exhibits no distension and no mass. There is no tenderness.   Midline vertical scar, well-healed   Musculoskeletal: He exhibits no edema.   Skin: Skin is warm and dry.     Assessment and Plan:   1. Controlled type 2 diabetes mellitus with  microalbuminuria, without long-term current use of insulin (HCC)  Continue metformin, check A1C with next labs. Will have eye exam at [facility]  - A1C RAPID, ONSITE; Future    2. Essential hypertension  Improved today on amlodipine 10mg , metprolol 25mg  daily, lisinopril 20mg  daily.  -Will f/u with renal in April, defers med changes until then, will continue BP home monitoring    3. CKD (chronic kidney disease) stage 3, GFR 30-59 ml/min (HCC)  -repeat BMP since increased dose of lisinopril (plans to come next week)    4. Pulmonary nodule, right  Incidentally found on CT abd  -F/u CT abd 3/1    5. History of squamous cell carcinoma of skin  -F/u with derm in April    6. Mediastinal lymphadenopathy  Patient unaware of this dx. Saw pulm on 2017 who recommended CT chest, EGD and possible bronchoscopy. Discussed with patient who is asymptomatic and would like to defer until work-up of RCC.    7. HCM  Recommended CRC screening, but declines at this time. Rediscuss at next visit.    Follow-up: 12/2017 to f/u HTN, DMII, CKD, RCC, HCM    Matthew Saras, MD  [facility]  [address]  [place] [address]  [phone]

## 2017-07-22 NOTE — Patient Instructions (Signed)
Please have diabetic eye exam done at [facility].

## 2017-07-22 NOTE — Progress Notes (Signed)
Reason for Visit: HTN f/u    Refills? NO  Referral? NO  Letter or Form? NO  Lab Results? NO    HEALTH MAINTENANCE:  Has the patient had this done since their last visit?  Cervical screening/PAP: N/A  Mammo: N/A  Colon Screen: Patient Declined    Have you seen a specialist since your last visit: No    Vaccines Due? Yes, Zoster #1    HM Due:   Health Maintenance   Topic Date Due   . Diabetes Eye Exam  02/25/1981   . Colorectal Cancer Screening (FOBT/FIT)  02/25/2013   . Zoster Vaccine (1 of 2) 02/25/2013   . Diabetes A1c  10/06/2017   . Depression Screening (PHQ-2)  03/04/2018   . Diabetes Foot Exam  04/07/2018   . Tetanus Vaccine  02/07/2021   . Lipid Disorders Screening  04/07/2022   . Influenza Vaccine  Completed   . Hepatitis C Screening  Completed   . HIV Screening  Completed       PCP Verified?  Yes, Matthew Saras, MD

## 2017-08-04 ENCOUNTER — Encounter (HOSPITAL_BASED_OUTPATIENT_CLINIC_OR_DEPARTMENT_OTHER): Payer: No Typology Code available for payment source | Admitting: Dermatology

## 2017-08-04 ENCOUNTER — Encounter (HOSPITAL_BASED_OUTPATIENT_CLINIC_OR_DEPARTMENT_OTHER): Payer: Self-pay | Admitting: Dermatology

## 2017-08-05 ENCOUNTER — Encounter (INDEPENDENT_AMBULATORY_CARE_PROVIDER_SITE_OTHER): Payer: Self-pay | Admitting: Family Medicine

## 2017-08-05 DIAGNOSIS — E785 Hyperlipidemia, unspecified: Secondary | ICD-10-CM

## 2017-08-05 MED ORDER — ATORVASTATIN CALCIUM 40 MG OR TABS
40.0000 mg | ORAL_TABLET | Freq: Every day | ORAL | 12 refills | Status: DC
Start: 2017-08-05 — End: 2018-08-02

## 2017-08-05 NOTE — Telephone Encounter (Signed)
Generic response sent. Routing to Dr. Al Achkar to please advise on pt's rx request. Thank you.

## 2017-08-10 ENCOUNTER — Encounter (HOSPITAL_BASED_OUTPATIENT_CLINIC_OR_DEPARTMENT_OTHER): Payer: Self-pay | Admitting: Dermatology

## 2017-08-10 ENCOUNTER — Ambulatory Visit: Payer: No Typology Code available for payment source | Attending: Dermatology | Admitting: Dermatology

## 2017-08-10 DIAGNOSIS — Z85828 Personal history of other malignant neoplasm of skin: Secondary | ICD-10-CM | POA: Insufficient documentation

## 2017-08-10 DIAGNOSIS — L821 Other seborrheic keratosis: Secondary | ICD-10-CM

## 2017-08-10 DIAGNOSIS — L299 Pruritus, unspecified: Secondary | ICD-10-CM | POA: Insufficient documentation

## 2017-08-10 NOTE — Patient Instructions (Addendum)
Recommend Sarna lotion      Preventing Skin Cancer  Relaxing in the sun may feel good. But it isn't good for your skin. In fact, being exposed to the sun's harmful rays is a major cause of skin cancer.  People of all ages and backgrounds are at risk.     Your role in prevention  You can act today to help prevent skin cancer. Start by avoiding the sun's UV (ultraviolet) rays. And don't use tanning beds, which are no safer than the sun. Taking these steps can help keep you from getting skin cancer. It can also help prevent wrinkles and other sun-induced aging effects. Make sure your children also follow these safeguards. Now is the time to start taking preventive steps against skin cancer.    When you are outdoors  Protect your skin when you go outdoors during the day.    Wear tightly woven clothing that covers your skin. Put on a wide-brimmed hat to protect your face, ears, and scalp.   Watch the clock. Try to avoid the sun between 10 a.m. and 4 p.m., when it is strongest.   Head for the shade or create your own. Use an umbrella when sitting or strolling.   Know that the sun's rays can reflect off sand, water, and snow. This can harm your skin. Take extra care when you are near reflective surfaces.   Keep in mind that even when the weather is hazy or cloudy, your skin can be exposed to strong UV rays.   Shield your skin with sunscreen. Also, apply sunscreen to your children's skin.    Tips for using sunscreen  To help prevent skin cancer, choose the right sunscreen and use it correctly. Try the following tips:   Choose a sunscreen that has a sun protection factor (SPF) of at least 23. Also, choose a sunscreen labeled "broad spectrum." This will shield you from both UVA and UVB (ultraviolet A and B) rays.   If one brand irritates your skin, try another   Use a water-resistant sunscreen if swimming or sweating.   Use at least an ounce of sunscreen (enough to fill a shotglass) to cover exposed areas. You  might need to adjust the amount depending on your body size.   Apply the sunscreen to dry skin about 15 minutes before going outdoors to give it time to be absorbed.   Reapply sunscreen every2 hours. If you're active or in the water, do this more often.   Cover any sun-exposed skin, from your face to your feet. Don't forget your ears and your lips.   Know that while sunscreen helps protect you, it isn't enough. Sunscreens extend the length of time you can be outdoors before your skin begins to redden, but they don't give you total protection. Using sunscreen doesn't mean you can stay out in the sun indefinitely, since damage to the skin cells is still occurring. You should also wear protective clothing. And try to stay out of the sun as much as you can, especially from 10 a.m. to 4 p.m.  Adapted from:   2000-2015 [employer] [place], [place] [address]. All rights reserved. This information is not intended as a substitute for professional medical care. Always follow your healthcare professional's instructions.          HOW TO DO A SELF SKIN EXAMINATION:  (adapted from: http://www.skin cancer.org/skin-cancer-information/early-detection/step-by-step-self-examination)    1. Examine your face, especially the nose, lips, mouth, and ears (  both sides of your ears). Use one or both mirrors to get a clear view.  2. Thoroughly inspect your scalp, using a blow dryer and mirror to expose each section to view. Get a friend or family member to help, if you can.  3. Check your hands carefully: palms and backs, between the fingers and under the fingernails. Continue up the wrists to examine both front and back of your forearms.  4. Standing in front of the full-length mirror, begin at the elbows and scan all sides of your upper arms. Don't forget the underarms.  5. Next focus on the neck, chest, and torso. Women should lift breasts to view the underside.  6. With your back to the full-length  mirror, use the hand mirror to inspect the back of your neck, shoulders, upper back, and any part of the back of your upper arms you could not view in step 4.  7. Still using both mirrors, scan your lower back, buttocks, and backs of both legs.  8. Sit down; prop each leg in turn on the other stool or chair. Use the hand mirror to examine the genitals. Check front and sides of both legs, thigh to shin, ankles, tops of feet, between toes and under toenails. Examine soles of feet and heels.    Please be on the look out for any suspicious lesions.  These include growths or moles that are growing, changing or looking different than other growths or moles on the body. Also pay attention to any growths that itch, bleed, or don't heal. Consider taking digital photographs of any mole or growth that you want to monitor over time.  Call your provider if you have any questions or find a concerning growth or mole.    More information about skin cancer and what to look for can be found at:   http://www.myers.net/        CHANGES IN MOLES:  See your health care provider if your moles hurt, itch, ooze, bleed, thicken, or become crusty. Call your health care provider if your moles show signs of melanoma. These include a mole that has:     Asymmetry. The sides of the mole don't match   Border. The edges are ragged, notched, or blurred   Color. The color within the mole varies   Diameter. The mole is larger than 6 mm (size of a pencil eraser)   Evolving. The mole is getting larger or the shape or color of the mole is changing    Adapted from:   2000-2015 The [place], [place] [address]. All rights reserved. This information is not intended as a substitute for professional medical care. Always follow your healthcare professional's instructions.          PREVENTING SKIN CANCER:  Relaxing in the sun may feel good. But it isn't good for your skin. In fact, being exposed to  the sun's harmful rays is a major cause of skin cancer.  People of all ages and backgrounds are at risk.     Your role in prevention  You can act today to help prevent skin cancer. Start by avoiding the sun's UV (ultraviolet) rays. And don't use tanning beds, which are no safer than the sun. Taking these steps can help keep you from getting skin cancer. It can also help prevent wrinkles and other sun-induced aging effects. Make sure your children also follow these safeguards. Now is the time to start taking preventive steps against  skin cancer.    When you are outdoors  Protect your skin when you go outdoors during the day.    Wear tightly woven clothing that covers your skin. Put on a wide-brimmed hat to protect your face, ears, and scalp.   Watch the clock. Try to avoid the sun between 10 a.m. and 4 p.m., when it is strongest.   Head for the shade or create your own. Use an umbrella when sitting or strolling.   Know that the sun's rays can reflect off sand, water, and snow. This can harm your skin. Take extra care when you are near reflective surfaces.   Keep in mind that even when the weather is hazy or cloudy, your skin can be exposed to strong UV rays.   Shield your skin with sunscreen. Also, apply sunscreen to your children's skin.      Tips for using sunscreen  To help prevent skin cancer, choose the right sunscreen and use it correctly. Try the following tips:   Choose a sunscreen that has a sun protection factor (SPF) of at least 42. Also, choose a sunscreen labeled "broad spectrum." This will shield you from both UVA and UVB (ultraviolet A and B) rays.   If one brand irritates your skin, try another   Use a water-resistant sunscreen if swimming or sweating.   Use at least an ounce of sunscreen (enough to fill a shot glass) to cover exposed areas. You might need to adjust the amount depending on your body size.   Apply the sunscreen to dry skin about 15 minutes before going outdoors to give it  time to be absorbed.   Reapply sunscreen every2 hours. If you're active or in the water, do this more often.   Cover any sun-exposed skin, from your face to your feet. Don't forget your ears and your lips.   Know that while sunscreen helps protect you, it isn't enough. Sunscreens extend the length of time you can be outdoors before your skin begins to redden, but they don't give you total protection. Using sunscreen doesn't mean you can stay out in the sun indefinitely, since damage to the skin cells is still occurring. You should also wear protective clothing. And try to stay out of the sun as much as you can, especially from 10 a.m. to 4 p.m.    Adapted from:   2000-2015 The [place], [place] [address]. All rights reserved. This information is not intended as a substitute for professional medical care. Always follow your healthcare professional's instructions.    Web Sites to Learn More  . Skin Cancer Foundation:  www.skincancer.org/Sunscreens-Explained.html  . American Academy of Dermatology:  GenitalDoctor.no.html        SUNSCREEN RECOMMENDATIONS:  Rated 90 and above Consumer Reports 2018    Scented lotions:  - Equate [employer]) Sport Lotion SPF50 (low price)  - BullFrog Land Sport Quik Gel SPF50  - Coppertone Water Babies SPF 50 (fragrance free, oil free)  - Equate ([employer]) Ultra Protection Lotion SPF50 (low price)    Unscented lotion:  - La Roche-Posay Anthelios 60 melt in sunscreen milk lotion (does not leave an oily sheen to skin)  - Coppertone UltraGuard SPF70 lotion (low odor or residue)    Sprays: (we recommend lotions instead of sprays)  - Trader Joe's Spray SPF 50+ (low price) (has a floral and citrus scent)  - Banana Boat SunComfort Continuous Spray SPF 50+ (has a slight coconut scent)  - [employer]  Beach Guard Clear Spray SPF 70    Sticks: (none of the sticks were rated at 90 or above)  - Up & Up ([employer]) Kids Sunscreen  Stick SPF 55 (Target, low price)  _______________________________________    Consumer Reports also had these additional category recommendations in 2016:    Facial lotions:  - Avon Sun+ sunscreen face lotion SPF40  - [employer] Up & Up Ultra Sheer SPF30 (also is fragrance free)    Oxybenzone Free:  - Ocean Potion Protect & Nourish SPF 30 (<$10 for 8 oz)     Zinc Oxide/Titanium Dioxide only:  - Cotz plus SPF 58 (<$25 for 2.5 oz)  - [employer] Sensitive SPF30+ (~$15 for 2.9 oz)    -  Recommend gently dry skin care to help strengthen the skin barrier.    Dry Skin Care  Helpful tips    What causes dry skin?  Dry skin can be caused by:  Marland Kitchen Low-humidity indoor heat  . Cold winter air  . Air conditioning  . Too much sun or wind  . Harsh soaps or detergents  . Natural aging    Tips  These tips will help keep your skin from drying out:    Bathing  . Bathe less often. Most people do not need to bathe every day. Even though water soaks into your skin during a bath or shower, the water, heat, and soap actually remove protective oils from your skin. This can cause dry, itchy skin later.  . Take shorter, cooler baths and showers. Do not take long baths or showers, and do not use very hot water.  . Use less soap. You do not have to use soap on all your skin when you bathe. Use soap only in "hairy areas" (under your arms and around your anus and genital area).  . Use mild soap. When you use soap, use Dove, Camay, Basis, or Cetaphil bar soap. Do not use liquid soap. Hulan Fray is a good choice because it does not cost a lot, you can find it in many stores, and it is one of the mildest soaps you can buy.  Fraser Din instead of rubbing yourself dry. Right after bathing, pat your skin dry with a clean towel.  . Use a skin lubricant. Apply ointment, cream, or lotion to moisten your skin after you have bathed and patted your skin dry.    Using Skin Lubricants  Use only the amount of skin lubricant that you can easily rub into your skin. It  should not take long for your skin to absorb it. There should not be a greasy residue afterward. If there is, use less next time.  Everyone is different. What suits you may not work for someone else. The best skin lubricant is the one you like the best, because you are more likely to use it.  To find the best product for you:  . Buy several skin lubricants and compare them to each other. Use one on the left side of your body and a different one on the right side.  . After 2 or 3 weeks, decide which one you like best, and discard the other.  . Try the comparison again with a different product until you have found the one you are most happy with.    Types of Skin Lubricants  There are many types of skin lubricants to choose from. You can buy them at most drugstores or grocery stores.  Lotions  Lotions are the mildest  treatment for dry skin. They are usually tried first. Some brands of lotion are:  . Cetaphil . CeraVe . Curel  . Neutrogena body emulsion . Pen-Kera  . DML . Keri . Lubriderm . Moisturel    Creams  Creams are a bit more greasy, but they are usually more effective than lotions for treating dry skin. Try these creams:  . Eucerin . Cetaphil . CeraVe      Ointments  Ointments are even greasier and more effective than creams. They are easiest to apply right after a shower or bath, when your skin is still moist. You will need much less ointment when you put it on moist skin and your skin will be less greasy than if you put it on dry skin.  Two ointments to try are:  . Aquaphor . Vaseline petroleum jelly (the "ultimate" lubricant)

## 2017-08-10 NOTE — Progress Notes (Signed)
[facility]  [address]  [place]  [address]  TEL: [phone]  l  FAX: [phone]      08/10/2017    PRIMARY CARE PROVIDER:  Matthew Saras, [place] [address]    PATIENT: Sean Oliver    [id number]    IDENTIFICATION/CHIEF CONCERN:    Sean Oliver is a 57 year old male return patient seen today for cutaneous surveillance in the personal setting of invasive SCC.     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 He had an inflamed seborrheic keratosis to the right chest  treated with cryotherapy.     Since the last visit, he has not had any concerning lesions or changes to his skin.     He did have a lesion on his left knee in the past, but it has resolved. He does not report any associated or lingering symptoms.     He has had a persistent rash to his back. He has not been applying moisturizer to his back and he has a cat in the house.     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     ROS:  Gen - Feels well, No fevers/chills/recent illnesses/unintended weight loss  Skin - No other skin complaints    SOCIAL HISTORY:  Lives with wife and kids (triplets, born 2001).     PHYSICAL EXAMINATION:  General: He is a well appearing adult male in no acute distress.  Normal development, and nutrition.  Normal affect and mood, without barriers to understanding.  Skin: Exam performed included scalp, hair, face, eyelids, lips, neck, chest, abdomen, back, bilateral arms, hands, fingers, fingernails, bilateral legs, feet, toenails, and buttocks. Exam was unremarkable except for:    Chest - Number of  stuck on brown papules c/w seborrheic keratoses   Right great toenail, right 5th toe, left great toe, left 3rd toe, and left 5th toe  - Hyperkeratotic toenail  Back - mild dry skin  No evidence of SCC recurrence in previously treated areas    IMPRESSION/PLAN:  The following was discussed with the patient.  Recommendations are as follows:    1. Seborrheic keratosis  These are benign lesions. They can be treated with cryotherapy (liquid nitrogen)if irritated or inflamed.     2. History of squamous cell carcinoma of skin  No evidence of recurrence, no further treatment needed. Recommend regular skin exams, self-monitoring, and sun protection. Return to clinic for bleeding, scabbing, painful, growing, or non-healing lesions.    3. Pruritus  Patient with itchy skin on his back. We discussed that this can be related to dry skin and I recommended that he moisturize the area. We also discussed the possibility notalgia paresthetica. I recommended that he let me know if no improvement with emollient use.    4. 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 Let me know if any worsening    Return to clinic in 12-16  months or sooner PRN acute concerns.    08/10/2017 @ 3:37 PM - I, Adelene Idler, Medical Scribe acted as a Education administrator and documented the service/procedure performed to the best of my knowledge in the presence of Karlyne Greenspan, MD who will provide the final review and authentication.  Signed: Adelene Idler, Medical Scribe    I, Karlyne Greenspan, MD, personally performed the services described in this documentation, as scribed by Rebekah You in my presence, and it is both accurate and complete.

## 2017-08-10 NOTE — Progress Notes (Signed)
Chief Complaint FSE    If our office needs to contact you after your visit today, is it ok to leave a detailed message on your phone or e-care ?  YES      What is the preferred method of contact e-care/ phone  number? [phone] (M)

## 2017-08-13 ENCOUNTER — Other Ambulatory Visit (HOSPITAL_BASED_OUTPATIENT_CLINIC_OR_DEPARTMENT_OTHER): Payer: No Typology Code available for payment source

## 2017-08-16 ENCOUNTER — Ambulatory Visit: Payer: No Typology Code available for payment source | Attending: Urology

## 2017-08-16 DIAGNOSIS — N2889 Other specified disorders of kidney and ureter: Secondary | ICD-10-CM

## 2017-08-16 LAB — CREATININE BY I_STAT (POC), ~~LOC~~: Creatinine (POC): 1.7 mg/dL — ABNORMAL HIGH (ref 0.51–1.18)

## 2017-08-20 ENCOUNTER — Encounter (INDEPENDENT_AMBULATORY_CARE_PROVIDER_SITE_OTHER): Payer: Self-pay | Admitting: Family Medicine

## 2017-08-20 ENCOUNTER — Encounter (HOSPITAL_BASED_OUTPATIENT_CLINIC_OR_DEPARTMENT_OTHER): Payer: Self-pay | Admitting: Nephrology

## 2017-08-22 ENCOUNTER — Encounter (INDEPENDENT_AMBULATORY_CARE_PROVIDER_SITE_OTHER): Payer: Self-pay | Admitting: Urology

## 2017-08-23 ENCOUNTER — Telehealth (HOSPITAL_BASED_OUTPATIENT_CLINIC_OR_DEPARTMENT_OTHER): Payer: Self-pay

## 2017-08-23 NOTE — Telephone Encounter (Signed)
Left message in response to his ecare message about CT results.  Asked him to contact the Urology clinic for more detailed information.

## 2017-08-23 NOTE — Telephone Encounter (Signed)
Sent the following eCARE message to patient.        Emeline General Abenroth RN, BSN  [facility]  [facility]

## 2017-09-02 ENCOUNTER — Other Ambulatory Visit (INDEPENDENT_AMBULATORY_CARE_PROVIDER_SITE_OTHER): Payer: Self-pay | Admitting: Family Medicine

## 2017-09-02 DIAGNOSIS — I10 Essential (primary) hypertension: Secondary | ICD-10-CM

## 2017-09-02 MED ORDER — METOPROLOL SUCCINATE ER 25 MG OR TB24
EXTENDED_RELEASE_TABLET | ORAL | 5 refills | Status: DC
Start: 2017-09-02 — End: 2018-03-03

## 2017-09-07 ENCOUNTER — Other Ambulatory Visit (HOSPITAL_BASED_OUTPATIENT_CLINIC_OR_DEPARTMENT_OTHER): Payer: Self-pay

## 2017-09-07 DIAGNOSIS — N183 Chronic kidney disease, stage 3 unspecified: Secondary | ICD-10-CM

## 2017-09-12 ENCOUNTER — Encounter (INDEPENDENT_AMBULATORY_CARE_PROVIDER_SITE_OTHER): Payer: Self-pay | Admitting: Family Medicine

## 2017-09-13 NOTE — Telephone Encounter (Signed)
Rx and pharmacy pended for signature if appropriate. Thank you!

## 2017-09-16 ENCOUNTER — Ambulatory Visit: Payer: No Typology Code available for payment source | Attending: Nephrology | Admitting: Nephrology

## 2017-09-16 ENCOUNTER — Encounter (HOSPITAL_BASED_OUTPATIENT_CLINIC_OR_DEPARTMENT_OTHER): Payer: Self-pay | Admitting: Nephrology

## 2017-09-16 VITALS — BP 149/106 | HR 67 | Temp 98.4°F | Ht 74.02 in | Wt 217.8 lb

## 2017-09-16 DIAGNOSIS — N189 Chronic kidney disease, unspecified: Secondary | ICD-10-CM | POA: Insufficient documentation

## 2017-09-16 DIAGNOSIS — N183 Chronic kidney disease, stage 3 unspecified: Secondary | ICD-10-CM

## 2017-09-16 DIAGNOSIS — E1129 Type 2 diabetes mellitus with other diabetic kidney complication: Secondary | ICD-10-CM

## 2017-09-16 DIAGNOSIS — I10 Essential (primary) hypertension: Secondary | ICD-10-CM | POA: Insufficient documentation

## 2017-09-16 DIAGNOSIS — R809 Proteinuria, unspecified: Secondary | ICD-10-CM | POA: Insufficient documentation

## 2017-09-16 DIAGNOSIS — C642 Malignant neoplasm of left kidney, except renal pelvis: Secondary | ICD-10-CM | POA: Insufficient documentation

## 2017-09-16 LAB — RENAL FUNCTION PANEL
Albumin: 4.2 g/dL (ref 3.5–5.2)
Anion Gap: 4 (ref 4–12)
Calcium: 9.4 mg/dL (ref 8.9–10.2)
Carbon Dioxide, Total: 30 meq/L (ref 22–32)
Chloride: 106 meq/L (ref 98–108)
Creatinine: 1.49 mg/dL — ABNORMAL HIGH (ref 0.51–1.18)
GFR, Calc, African American: 60 mL/min/{1.73_m2} (ref >59)
GFR, Calc, European American: 49 mL/min/{1.73_m2} — ABNORMAL LOW (ref >59)
Glucose: 238 mg/dL — ABNORMAL HIGH (ref 62–125)
Phosphate: 2.8 mg/dL (ref 2.5–4.5)
Potassium: 3.7 meq/L (ref 3.6–5.2)
Sodium: 140 meq/L (ref 135–145)
Urea Nitrogen: 14 mg/dL (ref 8–21)

## 2017-09-16 LAB — URINALYSIS COMPLETE, URN
Bacteria, URN: NONE SEEN
Bilirubin (Qual), URN: NEGATIVE
Epith Cells_Renal/Trans,URN: NEGATIVE /HPF
Epith Cells_Squamous, URN: NEGATIVE /LPF
Ketones, URN: NEGATIVE mg/dL
Leukocyte Esterase, URN: NEGATIVE
Nitrite, URN: NEGATIVE
RBC, URN: NEGATIVE /HPF
Specific Gravity, URN: 1.013 g/mL (ref 1.006–1.027)
WBC, URN: NEGATIVE /HPF
pH, URN: 6 (ref 5.0–8.0)

## 2017-09-16 LAB — PROTEIN/CREATININE RATIO, TIMED URINE
Creatinine/24H, Urine: UNDETERMINED mg/(24.h) (ref 1000–2000)
Creatinine/Unit, Urine: 153 mg/dL
Protein (Total), Urine: 200 mg/dL
Protein/Creatinine Ratio: 1.3 — ABNORMAL HIGH (ref <0.2)
Total Protein/24H, Urine: UNDETERMINED g/d (ref 0.05–0.08)

## 2017-09-16 LAB — CBC (HEMOGRAM)
Hematocrit: 47 % (ref 38–50)
Hemoglobin: 15.2 g/dL (ref 13.0–18.0)
MCH: 28.4 pg (ref 27.3–33.6)
MCHC: 32.5 g/dL (ref 32.2–36.5)
MCV: 88 fL (ref 81–98)
Platelet Count: 273 10*3/uL (ref 150–400)
RBC: 5.35 10*6/uL (ref 4.40–5.60)
RDW-CV: 15.4 % — ABNORMAL HIGH (ref 11.6–14.4)
WBC: 6.2 10*3/uL (ref 4.3–10.0)

## 2017-09-16 MED ORDER — LISINOPRIL 10 MG OR TABS
10.0000 mg | ORAL_TABLET | Freq: Two times a day (BID) | ORAL | 3 refills | Status: DC
Start: 2017-09-16 — End: 2018-08-02

## 2017-09-16 MED ORDER — NIFEDIPINE ER 30 MG OR TB24
30.0000 mg | EXTENDED_RELEASE_TABLET | Freq: Two times a day (BID) | ORAL | 3 refills | Status: DC
Start: 2017-09-16 — End: 2018-08-02

## 2017-09-16 NOTE — Progress Notes (Signed)
NEPHROLOGY CLINIC FOLLOW UP NOTE            REFERRING PHYSICIAN:   Morhaf Al Achkar     INITIAL CONSULT DATE: 05/28/2017      Past Medical History:   Diagnosis Date   . Cancer ([place])    . Diabetes mellitus ([place])    . Hypertension    . Kidney disease    . Lipidemia    . Lung disease    . Type II or unspecified type diabetes mellitus without mention of complication, not stated as uncontrolled [facility])      Past Surgical History:   Procedure Laterality Date   . left eye surgery x 5     . RPR INGUN HERNIA SLIDING ANY AGE      many years ago.  bilateral.        HISTORY OF PRESENT ILLNESS: Sean Oliver is a 57 year old male with past medical history significant for the above mentioned problems.    CC: Follow up on CKD and Hypertension     Based on verbal report, past medical history is significant for:    Diabetes mellitus of at least 15 years duration, though has been very well controlled over this period of time.  He was initially started off on insulin but subsequently has been switched to metformin only with good glycemic control.  By his report, he does not know of any end organ damage including absence of any diabetic retinopathy or neuropathy.  Also has long-standing hypertension of at least 20 years duration.  While he has not checked his pressures regularly at home, he reports that for the most part at doctors visits he was told that the blood pressure is only marginally elevated.  Prolonged time he was on combination lisinopril with hydrochlorothiazide, that was changed subsequently to alternative agents.  One reasoning for stopping lisinopril was to determine if it would lead to an improvement in creatinine/GFR.  However, over the last few months patient has been experiencing uncontrolled hypertension with most readings in the 160s-170s/90s-100 range.  He is not specifically expressing any symptoms related to these uncontrolled readings and denies any history of TIAs/CVAs.  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  Also had considerable issues with diverticulitis leading to laparotomy with limited resection as well in the past.    During the recent follow-ups with primary care physician and in effort to undertake a diagnostic evaluation for his chronic kidney disease, both urinary and imaging studies were undertaken.  Urinalyses now revealing increased albuminuria while imaging studies including ultrasound, CT scan and MRI also indicating around 2 cm left-sided interpolar mass most compatible with renal cell carcinoma.  He has not experienced any specific flank pains, dysuria, renal colic or hematuria.  Similarly no major change in urinary habit and no problems with dependent edema.    No history of nephrolithiasis, recurrent urinary tract infections or pyelonephritis.  Only occasional use of NSAIDs with no over-the-counter/herbal supplement use.    Interval History:  No acute illnesses, competitions or hospitalizations in the interim.  Overall, he has fared well with no acute issues.  Seems like hypertension remains uncontrollable and problematic.  The dose of lisinopril was increased to 20 mg daily on a follow-up visit with his primary care physician.   He has follow-up with urology for follow-up of left renal lesion with regards to suspicion for renal cell carcinoma.  He has undergone a  recent CT scan and will be evaluated in urology clinic for surgical intervention in the next few days.  In the meanwhile, he has picked up walking for exercise over the last week.  He is already cognizant of following a low-salt diet and is trying to stay away from red meats.  He has not experienced any dyspnea, chest pain, visual problems or dependent edema.  He did have some problem in refilling his metformin prescription because of pharmacy concerns with his renal function.    Otherwise, patient denies any acute problems and a directed review of  systems was essentially unremarkable.          ALLERGIES:    Review of patient's allergies indicates:  No Known Allergies    HOME MEDICATIONS:      Current Outpatient Medications:   .  Atorvastatin Calcium 40 MG Oral Tab, Take 1 tablet (40 mg) by mouth daily., Disp: 30 tablet, Rfl: 12  .  Fluticasone Propionate 50 MCG/ACT Nasal Suspension, Spray 1 spray into each nostril daily as needed. , Disp: , Rfl: 3  .  Lisinopril 10 MG Oral Tab, Take 1 tablet (10 mg) by mouth every 12 hours. DOSE INCREASE, Disp: 180 tablet, Rfl: 3  .  MetFORMIN HCl 1000 MG Oral Tab, once daily, Disp: , Rfl:   .  Metoprolol Succinate ER 25 MG Oral TABLET SR 24 HR, Take two tablets by mouth daily  (do not crush or chew tablets), Disp: 60 tablet, Rfl: 5  .  NIFEdipine ER 30 MG Oral TABLET SR 24 HR, Take 1 tablet (30 mg) by mouth 2 times a day., Disp: 180 tablet, Rfl: 3  .  Sildenafil Citrate (VIAGRA) 100 MG Oral Tab, Take 1 tablet (100 mg) by mouth daily as needed for erectile dysfunction. Do not take with other blood pressure lowering medications, Disp: 30 tablet, Rfl: 3        PHYSICAL EXAM:     Vitals Signs: BP (!) 149/106   Pulse 67   Temp 98.4 F (36.9 C) (Temporal)   Ht 6' 2.02" (1.88 m)   Wt 217 lb 13 oz (98.8 kg)   SpO2 97% Comment: ra  BMI 27.95 kg/m .    Constitutional: Sitting comfortably in chair, in no apparent distress.  Eyes:  Pupils normal size, EOM intact  Mouth/Throat: Mucous membranes are moist,  no oropharyngeal lesions  Neck: No JVD or carotid bruits  Lungs: No rhonchi or creptitus  Heart: S1, S2 with II/VI ESM, no rubs/ gallops  Abdomen: Soft, nontender; bowel sounds normal  Extremities: No pretibial or pedal edema,    Neuro: Alert O X3, no focal deficiits  Skin: No rash        DIAGNOSTIC STUDIES:    Orders Only on 08/13/2017   Component Date Value   . Creatinine by i_Stat (PO* 08/16/2017 1.7*   . Spec Desc I_Stat (POC) C* 08/16/2017 None    . Performing Lab i_STAT (P* 08/16/2017 Test performed by: [facility] of  [address], [place] [address]    Office Visit on 07/05/2017   Component Date Value   . Sodium 07/05/2017 144    . Potassium 07/05/2017 3.9    . Chloride 07/05/2017 107    . Carbon Dioxide, Total 07/05/2017 30    . Anion Gap 07/05/2017 7    . Glucose 07/05/2017 117    . Urea Nitrogen 07/05/2017 18    . Creatinine 07/05/2017 1.51*   . Albumin 07/05/2017  4.0    . Calcium 07/05/2017 9.6    . Phosphate 07/05/2017 3.5    . GFR, Calc, European Amer* 07/05/2017 48*   . GFR, Calc, African Ameri* 07/05/2017 59*   . GFR, Information 07/05/2017 Calculated GFR in mL/min/1.73 m2 by MDRD equation.  Inaccurate with changing renal function.  See [email or web address]    . Protein/Creat Ratio Int,* 07/05/2017 Random    . Protein/Creat Ratio Volu* 07/05/2017 Random    . Protein (Total), URN 07/05/2017 170    . Total Protein/24H, URN 07/05/2017 Unable to calculate value on a random specimen.    . Creatinine/Unit, URN 07/05/2017 165    . Creatinine/24H, URN 07/05/2017 Unable to calculate value on a random specimen.    . Protein/Creatinine Ratio 07/05/2017 1.0*   Office Visit on 05/28/2017   Component Date Value   . Color, URN 05/28/2017 Yellow    . Clarity, URN 05/28/2017 Clear    . Specific Gravity, URN 05/28/2017 1.021    . pH, URN 05/28/2017 < or =5.0    . Protein ([place]),* 05/28/2017 100-299(2+)*   . Glucose Qual, URN 05/28/2017 Negative    . Ketones, URN 05/28/2017 Negative    . Bilirubin (Qual), URN 05/28/2017 Negative    . Occult Blood, URN 05/28/2017 Small(1+)*   . Nitrite, URN 05/28/2017 Negative    . Leukocyte Esterase, URN 05/28/2017 Negative    . Urobilinogen, URN 05/28/2017 0.1-1.9    . Comments for Macroscopic* 05/28/2017 None    . WBC, URN 05/28/2017 0-5(NEG)    . RBC, URN 05/28/2017 0-2(NEG)    . Bacteria, URN 05/28/2017 Not Seen    . Epith Cells_Squamous, URN 05/28/2017 >5(PRESENT)*   . Epith Cells_Renal/Trans,* 05/28/2017 <3(NEG)    .  Comments For Microscopic* 05/28/2017 None    . Mucus, URN 05/28/2017 Present*   . Protein/Creat Ratio Int,* 05/28/2017 Random    . Protein/Creat Ratio Volu* 05/28/2017 Random    . Protein (Total), URN 05/28/2017 207    . Total Protein/24H, URN 05/28/2017 Unable to calculate value on a random specimen.    . Creatinine/Unit, URN 05/28/2017 217    . Creatinine/24H, URN 05/28/2017 Unable to calculate value on a random specimen.    . Protein/Creatinine Ratio 05/28/2017 1.0*         Renal US 04/28/17:  The kidneys are normal in size.   There appears to be a slight diffuse increase in renal echogenicity as compared to the liver, raising question of diffuse medical renal disease.  There is no evidence of mass or cyst on the right.  On the left, there is a simple cyst measuring 2.1 cm on the upper pole. In the midportion of the left kidney there is also a circumscribed hypoechoic area, with internal echoes, measuring 2.2 x 1.8 x 1.8 cm.  No ascites.     Urinary bladder: Moderately full, measuring 7.3 x 6.6 x 6.9 cm. Also a slight prominent bladder wall thickness diffusely, nonspecific. Prostate is subjectively large.    CT 08/16/17:  Kidneys and Ureters: No significant interval change in size (today is 1.8 cm, previously 1.8 cm) of left kidney posterior interpolar region mildly enhancing solid lesion and displaying a precontrast attenuation of approximately 40 Hounsfield units (302/56), corticomedullary attenuation of approximately 90 216. Units (305/49), nephrographic attenuation of approximately 90 Hounsfield units (306/50), and excretory phase attenuation of approximately 60 Hounsfield units (301/51). This lesion, which does not display "angular interface sign" with the neighboring renal parenchyma, showed high DWI restriction (low apparent diffusion  coefficient value) on prior MRI with mildly intrinsically T1 high signal and isointensity on T2-weighted images with possible pseudocapsule. This mass does not show  intra-lesional fat. No invasion of renal sinus, collecting system or Gerota's fascia.  Unchanged left kidney upper pole Bosniak 1 simple cyst.  Right inter-polar collecting system nonobstructing nephrolith (302/54). No hydronephrosis.  Tiny cortical parenchymal hypodensities in the right kidney are too small to characterize.         ASSESSMENT AND PLAN:    -Chronic Kidney Disease Stage 3  Review of available labs since at least 2015 show a creatinine ranging from 1.27-1.61 (and one value obtained through [facility] from 04/28/1996 of 1.62m/dL).  This indicates long-standing element of chronic kidney disease with eGFR likely in the ~ 55 mL/minute range albeit a more or less stable course over this period of time with minimal progression.  Last serum creatinine at 1.51 with eGFR 59 mL/minute and awaiting today's lab results.  While current albuminuria is around 1 g, his chronic kidney disease appears possibly from hypertensive nephrosclerosis (corroborated by findings of LVH on ECHO as well), some parenchymal loss from cysts with likely less of an element of diabetic nephropathy.  Ultrasound of the kidneys is instructive in this regard indicating increased echogenicity from interstitial fibrosis.  Previously, a monoclonal disorder was ruled out with urine and serum electrophoresis studies as well as free light chain assays.    Will also follow up with repeat proteinuria evaluation today now that he has been on ACE inhibitor in the interim.    With regards to need for kidney biopsy, if patient undergoes partial nephrectomy for presumptive renal cell carcinoma in his left kidney, will review the kidney tissue for any primary renal pathology.  Needs continued aggressive risk factor modification.  Also isolated on continuing with low salt, low protein diet to help slow down the rate of progression.    -Hypertension, Primary  Blood pressure control remains problematic with current regimen including amlodipine 10 mg  daily and metoprolol 50 mg daily with lisinopril 20 mg daily (dose increased recently from 10 mg daily).   Consequently, for better blood pressure control will switch amlodipine to nifedipine XL 30 mg twice a day at this time.  For better nocturnal coverage, will also split lisinopril to 10 mg twice a day as well.       -Diabetes mellitus type 2, controlled  Overall stable control of diabetes mellitus with last hemoglobin A1c of 6.4% and self-reported absence of any diabetic retinopathy or neuropathy despite the long vintage.  Have resumed lisinopril for better blood pressure control and for maximum anti-proteinuric effect.  Given his GFR, there is no contraindication for continued use of metformin.    -Renal cell carcinoma   Imaging studies showing a 2 cm lesion in the interpolar region of the left kidney, likely renal cell carcinoma.  He has met with Dr. HJodi Mourningin urology clinic where pros and cons of conservative versus surgical therapy were discussed.  Also underwent a recent CT scanning for follow-up and has an upcoming appointment with urology clinic regarding further management strategies.               Sean Hausen MD, 09/16/2017  Division of Nephrology  [facility] [place]

## 2017-09-20 ENCOUNTER — Ambulatory Visit (HOSPITAL_BASED_OUTPATIENT_CLINIC_OR_DEPARTMENT_OTHER): Payer: No Typology Code available for payment source

## 2017-09-20 ENCOUNTER — Encounter (INDEPENDENT_AMBULATORY_CARE_PROVIDER_SITE_OTHER): Payer: Self-pay | Admitting: Urology

## 2017-09-20 ENCOUNTER — Ambulatory Visit: Payer: No Typology Code available for payment source | Attending: Urology | Admitting: Urology

## 2017-09-20 ENCOUNTER — Encounter (INDEPENDENT_AMBULATORY_CARE_PROVIDER_SITE_OTHER): Payer: No Typology Code available for payment source | Admitting: Urology

## 2017-09-20 VITALS — BP 145/93 | HR 61 | Ht 73.0 in | Wt 210.0 lb

## 2017-09-20 DIAGNOSIS — R918 Other nonspecific abnormal finding of lung field: Secondary | ICD-10-CM | POA: Insufficient documentation

## 2017-09-20 DIAGNOSIS — N2889 Other specified disorders of kidney and ureter: Secondary | ICD-10-CM

## 2017-09-20 DIAGNOSIS — C642 Malignant neoplasm of left kidney, except renal pelvis: Secondary | ICD-10-CM

## 2017-09-20 DIAGNOSIS — Z6827 Body mass index (BMI) 27.0-27.9, adult: Secondary | ICD-10-CM

## 2017-09-20 NOTE — Progress Notes (Signed)
[facility] Follow up Note    Reason for visit/Chief Complaint:    Left renal mass    HPI:   57 year old male who presents for follow up for a 2.2 cm left renal mass on active surveillance. Patient was last seen by Dr. Jodi Mourning 4 months ago. He is accompanied by his wife.     His renal mass was diagnosed on renal ultrasound for proteinuria on 04/28/17. CT showed 2.2 cm expansile enhancing lesion with rapid washout in interpolar region of the left kidney. Following 05/10/2017 MRI demonstrated 1.8 cm solid renal mass in left interpolar kidney. His creatinine has been elevated to 1.5 on 05/10/2017. He is not being evaluated for kidney function long-term.    He has a history of diverticulitis and was treated with colectomy twice and once with mesh. Five feet of intestine were removed with no ostomy placed. He has a history of incisional hernia.    During last visit 4 months ago with Dr. Jodi Mourning, options for treatment were discussed including active surveillance, partial nephrectomy, and radical nephrectomy. He opted for active surveillance and wanted to have his PCP address his hypertension (BP was 158/111 last visit).    Other medical problems include stage 3 CKD, hypertension and type II DM.    Today he returns to discuss results of surveillance CT scan. He has no new symptoms. His blood pressure is slightly improved after changing dosages of his BP meds by his PCP and nephrologist.      Past Medical Hx:    Past Medical History:   Diagnosis Date   . Cancer ([place])    . Diabetes mellitus ([place])    . Hypertension    . Kidney disease    . Lipidemia    . Lung disease    . Type II or unspecified type diabetes mellitus without mention of complication, not stated as uncontrolled [facility])        Patient Active Problem List   Diagnosis   . Controlled type 2 diabetes mellitus with microalbuminuria, without long-term current use of insulin ([place])   . History of squamous cell carcinoma of skin   . Marijuana abuse   .  Mediastinal lymphadenopathy   . Pulmonary nodule, right   . Essential hypertension   . CKD (chronic kidney disease) stage 3, GFR 30-59 ml/min ([facility])   . Proteinuria   . Other hyperlipidemia   . Renal cell carcinoma of left kidney [facility])       Past Surgical Hx:    Past Surgical History:   Procedure Laterality Date   . left eye surgery x 5     . RPR INGUN HERNIA SLIDING ANY AGE      many years ago.  bilateral.   5 ft of intestine removed - colectomy x2 for diverticulitis - no ostomy       Active Meds:    Outpatient Medications Marked as Taking for the 09/20/17 encounter (Office Visit) with Jacqlyn Larsen, MD   Medication Sig Dispense Refill   . Atorvastatin Calcium 40 MG Oral Tab Take 1 tablet (40 mg) by mouth daily. 30 tablet 12   . Fluticasone Propionate 50 MCG/ACT Nasal Suspension Spray 1 spray into each nostril daily as needed.   3   . Lisinopril 10 MG Oral Tab Take 1 tablet (10 mg) by mouth every 12 hours. DOSE INCREASE 180 tablet 3   . MetFORMIN HCl 1000 MG Oral Tab once daily     .  Metoprolol Succinate ER 25 MG Oral TABLET SR 24 HR Take two tablets by mouth daily  (do not crush or chew tablets) 60 tablet 5   . NIFEdipine ER 30 MG Oral TABLET SR 24 HR Take 1 tablet (30 mg) by mouth 2 times a day. 180 tablet 3   . Sildenafil Citrate (VIAGRA) 100 MG Oral Tab Take 1 tablet (100 mg) by mouth daily as needed for erectile dysfunction. Do not take with other blood pressure lowering medications 30 tablet 3       Allergies:    Allergies as of 09/20/2017   . (No Known Allergies)       Physical Exam:    BP (!) 145/93   Pulse 61   Ht 6\' 1"  (1.854 m)   Wt 210 lb (95.3 kg)   BMI 27.71 kg/m   General:  No apparent distress, well-nourished  Neuro:  Socially appropriate, normal gait, alert & oriented x3, normal affect  Head: atraumatic and normocephalic  Chest:  No audible wheezing      Labs:    Cr 1.49 -- 09/16/17    Radiology: I reviewed imaging including:    CT abdomen renal protocol (08/16/17):  IMPRESSION:  No  significant interval change of left kidney solid, non-fat-containing mass (today is 1.8 cm, previously 1.8 cm) Based on: (i) dynamic enhancement characteristics (mild degree of contrast-enhancement), (ii) lack of angular interface with the neighboring normal parenchyma, (iii) high DWI restriction (low apparent diffusion coefficient value), (iv) mildly intrinsically T1 high signal and iso-intensity on T2-weighted images with possible pseudocapsule, leading histologies are   low-grade type I papillary and chromophobe types cell renal cell carcinoma; other histologies, including clear cell renal cell carcinoma, oncocytoma and minimal-fat angiomyolipoma, are considered less likely in light of the aforementioned imaging features.  -No invasion of renal sinus, collecting system or Gerota's fascia.  -No renal vein or IVC thrombus. No abdominal adenopathy or visceral   metastases.    05/10/2017 MRI:   IMPRESSION:  1. Redemonstration of solid renal mass in the left interpolar kidney measuring  1.8 cm. Characteristics suggest papillary or chromophobe type renal cell   carcinoma, less likely oncocytoma or metastasis from primary malignancy   elsewhere.  2. No evidence of metastasis or renal vein invasion.    05/04/2017 CT:   IMPRESSION:  1. In the interpolar region of the left kidney, there is a 2.2 cm expansile   enhancing lesion with rapid washout, which is concerning for renal cell   carcinoma. No filling defects are noted in the opacified portions of the   collecting system.      ASSESSMENT/PLAN:    Sean Oliver is a 57 year old male with small left renal mass, stable in size at 4 month follow up.     We spent the majority of the visit going over his CT scan from 08/16/17, comparing it to previous MRI and CT. His left renal mass is stable in size. We discussed the potential benefit of surgery (partial nephrectomy, possible radical nephrectomy) vs. Potential peri-operative risks including bleeding, infection,  anesthetic complications, injury to bowel and surrounding organs (especially considering he has had multiple abdominal surgeries which increases likelihood of presence intra-abdominal adhesions). Although he is still anxious about the tumor, he and his wife both agreed to continue surveillance.     -CXR today  - MRI in 6 months to reassess size of left renal mass  - f/u with Dr Esperanza Richters 2019    Today  we discussed the results of his CT scan and options for treatment. Greater than 50% of the visit was spent on counseling the patient of his treatment options.

## 2017-09-20 NOTE — Patient Instructions (Signed)
-   Please obtain a chest X-ray   - MRI abdomen in 6 months to re-evaluate size of left kidney mass  - Follow up with Dr. Jodi Mourning after MRI

## 2017-09-28 NOTE — Telephone Encounter (Signed)
Called pt to ask him to make appointment at [facility] to discuss. L/M

## 2017-10-05 ENCOUNTER — Encounter (HOSPITAL_BASED_OUTPATIENT_CLINIC_OR_DEPARTMENT_OTHER): Payer: No Typology Code available for payment source | Admitting: Dermatology

## 2018-01-03 ENCOUNTER — Encounter (INDEPENDENT_AMBULATORY_CARE_PROVIDER_SITE_OTHER): Payer: Self-pay | Admitting: Family Medicine

## 2018-02-10 ENCOUNTER — Telehealth (HOSPITAL_BASED_OUTPATIENT_CLINIC_OR_DEPARTMENT_OTHER): Payer: Self-pay | Admitting: Nephrology

## 2018-02-10 NOTE — Telephone Encounter (Signed)
Called off recall list; left a message to schedule appointment, provided direct call back number for the patient in my message.

## 2018-02-18 ENCOUNTER — Ambulatory Visit (INDEPENDENT_AMBULATORY_CARE_PROVIDER_SITE_OTHER): Payer: No Typology Code available for payment source | Admitting: Family Medicine

## 2018-02-18 ENCOUNTER — Encounter (INDEPENDENT_AMBULATORY_CARE_PROVIDER_SITE_OTHER): Payer: Self-pay | Admitting: Family Medicine

## 2018-02-18 VITALS — BP 156/104 | HR 54 | Temp 97.8°F | Resp 16 | Wt 217.6 lb

## 2018-02-18 DIAGNOSIS — Z6828 Body mass index (BMI) 28.0-28.9, adult: Secondary | ICD-10-CM

## 2018-02-18 DIAGNOSIS — R05 Cough: Secondary | ICD-10-CM

## 2018-02-18 DIAGNOSIS — R059 Cough, unspecified: Secondary | ICD-10-CM

## 2018-02-18 MED ORDER — BENZONATATE 100 MG OR CAPS
100.0000 mg | ORAL_CAPSULE | Freq: Three times a day (TID) | ORAL | 0 refills | Status: DC | PRN
Start: 2018-02-18 — End: 2018-08-02

## 2018-02-18 MED ORDER — ALBUTEROL SULFATE HFA 108 (90 BASE) MCG/ACT IN AERS
2.0000 | INHALATION_SPRAY | RESPIRATORY_TRACT | 2 refills | Status: DC | PRN
Start: 2018-02-18 — End: 2018-08-02

## 2018-02-18 NOTE — Progress Notes (Signed)
D/W Dr. Maretta Bees.  57 yo man with 2 weeks dry cough.  Was in [place] on trails recently, 3 days into trip developed cough.  No other sx besides cough.  Exam CTA.  ? Viral.  No abx for now.  Antitussive prescribed, also Albuterol PRN.

## 2018-02-18 NOTE — Patient Instructions (Signed)
Patient Education     What Is Acute Bronchitis?  Acute bronchitis is when the airways in your lungs (bronchial tubes) become red and swollen (inflamed). It is usually caused by a viral infection. But it can also occur because of a bacteria or allergen. Symptoms include a cough that produces yellow or greenish mucus and can last for days or sometimes weeks.  Inside healthy lungs    Air travels in and out of the lungs through the airways. The linings of these airways produce sticky mucus. This mucus traps particles that enter the lungs. Tiny structures called cilia then sweep the particles out of the airways.     Healthy airway: Airways are normally open. Air moves in and out easily.      Healthy cilia: Tiny, hairlike cilia sweep mucus and particles up and out of the airways.     Lungs with bronchitis  Bronchitis often occurs with a cold or the flu virus. The airways become inflamed (red and swollen).There isa deep hacking cough from the extra mucus. Other symptoms may include:   Wheezing   Chest discomfort   Shortness of breath   Mild fever  A second infection, this time due to bacteria, may then occur. And airways irritated by allergens or smoke are more likely to get infected.       Inflamed airway: Inflammation and extra mucus narrow the airway, causing shortness of breath.      Impaired cilia: Extra mucus impairs cilia, causing congestion and wheezing. Smoking makes the problem worse.     Making a diagnosis  A physical exam, health history, and certain tests help your healthcare provider make the diagnosis.  Healthhistory  Your healthcare providerwillask youabout your symptoms.  The exam  Your provider listens toyour chest for signs of congestion. He or she may also checkyour ears, nose, and throat.  Possible tests   A sputum testfor bacteria. This requires a sample of mucus from your lungs.   A nasal or throat swab. This tests to see if you have a bacterial infection.   A chest X-ray. This is  doneif your healthcare provider thinks you have pneumonia.   Tests to check for an underlying condition. Other tests may be done to check for things such as allergies, asthma, or COPD (chronic obstructive pulmonary disease). You may need to see a specialist for more lung function testing.  Treating a cough  The main treatment for bronchitis is easing symptoms. Avoiding smoke, allergens, and other things that trigger coughing can often help. If the infection is bacterial, you may be given antibiotics. During the illness, it's important to get plenty of sleep. To ease symptoms:   Don't smoke. Also avoid secondhand smoke.   Use a humidifier. Or try breathing in steam from a hot shower. This may help loosen mucus.   Drink a lot of water and juice. They can soothe the throat and may help thin mucus.   Sit up or use extra pillows when in bed. This helps to lessen coughing and congestion.   Ask your providerabout usingmedicine. Ask about using cough medicine, pain and fever medicine, or a decongestant.  Antibiotics  Most cases of bronchitis are caused by cold or flu viruses. They don't need antibiotics to treat them, even if your mucus is thick and green or yellow. Antibiotics don't treat viral illness and antibiotics have not been shown to have any benefit in cases of acute bronchitis. Taking antibiotics when they are not needed increases your risk  of getting an infection later that is antibiotic-resistant. Antibiotics can also cause severe cases of diarrhea that require other antibiotics to treat. It is important that you accept your healthcare provider's opinion to not use antibiotics.Your provider will prescribe antibiotics if the infection is caused by bacteria. If they are prescribed:   Take all of the medicine. Take the medicine until it is used up, even if symptoms have improved. If you don't, the bronchitis may come back.   Take the medicines as directed. For instance, some medicines should be taken  with food.   Ask about side effects. Ask your provider or pharmacist what side effects are common, and what to do about them.  Follow-up care  You shouldsee your provider again in 2 to 3 weeks. By this time, symptoms should have improved. An infection that lasts longer may mean you havea more serious problem.  Prevention   Avoid tobacco smoke. If you smoke, quit. Stay away from smoky places. Ask friends and family not to smoke around you, or in your home or car.   Get checked forallergies.   Ask your provider about getting a yearly flu shot. Also ask about pneumococcal or pneumonia shots.   Wash your hands often. This helps reduce the chance of picking up viruses that cause colds and flu.  Call your healthcare provider if:   Symptoms worsen, or you have new symptoms   Breathing problems worsenor become severe   Symptoms don't get better within a week, or within 3days of taking antibiotics   Date Last Reviewed: 07/17/2015   2000-2018 [employer] [address], [place] [address]. All rights reserved. This information is not intended as a substitute for professional medical care. Always follow your healthcare professional's instructions.

## 2018-02-18 NOTE — Progress Notes (Signed)
Reason for Visit: cough    Refills? NO  Referral? YES  Letter or Form? NO  Lab Results? NO    HEALTH MAINTENANCE:  Has the patient had this done since their last visit?  Cervical screening/PAP: N/A  Mammo: N/A  Colon Screen: Referral Pending    Have you seen a specialist since your last visit: Yes, [facility], [facility], [facility]    Vaccines Due? Yes, Zoster #1    HM Due:   Health Maintenance   Topic Date Due   . Diabetes Eye Exam  02/25/1981   . Colorectal Cancer Screening (FOBT/FIT)  02/25/2013   . Zoster Vaccine (1 of 2) 02/25/2013   . Pneumococcal Vaccine: Pediatrics (0-5 years) and At-Risk Patients (6-64 years) (2 of 3 - PCV13) 03/18/2016   . Diabetes A1c  10/06/2017   . Depression Screening (PHQ-2)  03/04/2018   . Influenza Vaccine (1) 03/15/2018   . Diabetes Foot Exam  04/07/2018   . Tetanus Vaccine  02/07/2021   . Lipid Disorders Screening  04/07/2022   . Hepatitis C Screening  Completed   . HIV Screening  Completed       PCP Verified?  Yes, Matthew Saras, MD

## 2018-02-18 NOTE — Progress Notes (Signed)
FAMILY MEDICINE OUTPATIENT VISIT    An interpreter was not needed for the visit.   Type of Visit: established Problem Visit  Primary Care Provider:  Matthew Saras, MD    Sean Oliver is a 57 year old male who presents to discuss the following:    [place]  Chief Complaint   Patient presents with   . Cough     c/o dry cough x 1.5 weeks. pt denies any other sx     SUBJECTIVE  He was in [place] about two weeks ago when the cough started. It started dry, then became productive of a clear phlegm. He denied any chest pain, SOB, fever or chills.     Review of Systems   Constitutional: Negative for chills and fever.   HENT: Negative for congestion, postnasal drip, rhinorrhea, sneezing and sore throat.    Respiratory: Positive for cough. Negative for chest tightness, shortness of breath and wheezing.    Cardiovascular: Negative for chest pain and palpitations.     I personally reviewed and confirmed the medications, problem list, allergies and past medical history in the record with the patient today.    OBJECTIVE  Vitals:    02/18/18 1150 02/18/18 1156   BP: (!) 168/111 (!) 156/104   BP Cuff Size: Large Large   BP Site: Left Arm Right Arm   BP Position: Sitting Sitting   Pulse: 54    Resp: 16    Temp: 97.8 F (36.6 C)    TempSrc: Temporal    SpO2: 99%    Weight: 217 lb 9.6 oz (98.7 kg)      Physical Exam   Constitutional: No distress.   Eyes: Conjunctivae are normal.   Cardiovascular: Normal rate, regular rhythm and normal heart sounds.   Pulmonary/Chest: Effort normal and breath sounds normal. No stridor. No respiratory distress. He has no wheezes. He has no rales.   Skin: He is not diaphoretic.     ASSESSMENT AND PLAN  Sean Oliver is a 57 year old male with the below diagnoses.    1. Cough  Most likely a viral cough. No red-flags on exam or hx. Counseled on conservative management.   - benzonatate (TESSALON PERLES) 100 MG Oral Capsule; Take 1 capsule (100 mg) by mouth 3 times a day as needed for cough.   Dispense: 21 capsule; Refill: 0  - albuterol 108 (90 Base) MCG/ACT Inhalation Aero Soln; Inhale 2 puffs by mouth every 4 hours as needed for shortness of breath/wheezing.  Dispense: 1 Inhaler; Refill: 2    Return for routine follow-up.    Aldean Baker, MD  [facility] MEDICINE [facility]  [place] [address]  [phone]    ----------------------------------------------------------  Patient's Medications   New Prescriptions    ALBUTEROL 108 (90 BASE) MCG/ACT INHALATION AERO SOLN    Inhale 2 puffs by mouth every 4 hours as needed for shortness of breath/wheezing.    BENZONATATE (TESSALON PERLES) 100 MG ORAL CAPSULE    Take 1 capsule (100 mg) by mouth 3 times a day as needed for cough.   Previous Medications    ATORVASTATIN CALCIUM 40 MG ORAL TAB    Take 1 tablet (40 mg) by mouth daily.    FLUTICASONE PROPIONATE 50 MCG/ACT NASAL SUSPENSION    Spray 1 spray into each nostril daily as needed.     LISINOPRIL 10 MG ORAL TAB    Take 1 tablet (10 mg) by mouth every 12 hours.  DOSE INCREASE    METFORMIN HCL 1000 MG ORAL TAB    once daily    METOPROLOL SUCCINATE ER 25 MG ORAL TABLET SR 24 HR    Take two tablets by mouth daily  (do not crush or chew tablets)    NIFEDIPINE ER 30 MG ORAL TABLET SR 24 HR    Take 1 tablet (30 mg) by mouth 2 times a day.    SILDENAFIL CITRATE (VIAGRA) 100 MG ORAL TAB    Take 1 tablet (100 mg) by mouth daily as needed for erectile dysfunction. Do not take with other blood pressure lowering medications   Modified Medications    No medications on file   Discontinued Medications    No medications on file

## 2018-02-22 ENCOUNTER — Encounter (HOSPITAL_BASED_OUTPATIENT_CLINIC_OR_DEPARTMENT_OTHER): Payer: Self-pay | Admitting: Nephrology

## 2018-02-23 ENCOUNTER — Telehealth (HOSPITAL_BASED_OUTPATIENT_CLINIC_OR_DEPARTMENT_OTHER): Payer: Self-pay

## 2018-02-23 NOTE — Telephone Encounter (Signed)
Spoke to Sean Oliver re his question about increase urination at night.  Dr Wynell Balloon would like him to start on Tamsulosin.  [name] feels it is his antihypertensive causing the problem and would like to see Dr Wynell Balloon in clinic.  I recommended he see urology but he prefers to see Dr Wynell Balloon.  Message sent to our scheduler to make an appointment.

## 2018-02-24 NOTE — Progress Notes (Signed)
I have personally discussed the case with the resident during or immediately after the patient visit including review of history, physical exam, diagnosis, and treatment plan. I agree with the assessment and plan of care.

## 2018-02-28 ENCOUNTER — Telehealth (HOSPITAL_BASED_OUTPATIENT_CLINIC_OR_DEPARTMENT_OTHER): Payer: Self-pay | Admitting: Nephrology

## 2018-02-28 NOTE — Telephone Encounter (Signed)
Called [facility] to schedule an appointment with Dr. Wynell Balloon. LVMTCB.

## 2018-03-03 ENCOUNTER — Other Ambulatory Visit (INDEPENDENT_AMBULATORY_CARE_PROVIDER_SITE_OTHER): Payer: Self-pay | Admitting: Family Medicine

## 2018-03-03 DIAGNOSIS — I10 Essential (primary) hypertension: Secondary | ICD-10-CM

## 2018-03-03 MED ORDER — METOPROLOL SUCCINATE ER 25 MG OR TB24
50.0000 mg | EXTENDED_RELEASE_TABLET | Freq: Every day | ORAL | 1 refills | Status: DC
Start: 2018-03-03 — End: 2018-08-02

## 2018-03-22 ENCOUNTER — Ambulatory Visit: Payer: No Typology Code available for payment source | Attending: Urology

## 2018-03-22 DIAGNOSIS — N281 Cyst of kidney, acquired: Secondary | ICD-10-CM

## 2018-03-22 DIAGNOSIS — N2889 Other specified disorders of kidney and ureter: Secondary | ICD-10-CM | POA: Insufficient documentation

## 2018-03-22 DIAGNOSIS — K7689 Other specified diseases of liver: Secondary | ICD-10-CM | POA: Insufficient documentation

## 2018-03-22 DIAGNOSIS — C642 Malignant neoplasm of left kidney, except renal pelvis: Secondary | ICD-10-CM

## 2018-05-06 ENCOUNTER — Other Ambulatory Visit: Payer: Self-pay

## 2018-07-02 ENCOUNTER — Other Ambulatory Visit (INDEPENDENT_AMBULATORY_CARE_PROVIDER_SITE_OTHER): Payer: Self-pay | Admitting: Family Medicine

## 2018-07-02 DIAGNOSIS — N529 Male erectile dysfunction, unspecified: Secondary | ICD-10-CM

## 2018-07-05 MED ORDER — SILDENAFIL CITRATE 100 MG OR TABS
ORAL_TABLET | ORAL | 0 refills | Status: DC
Start: 2018-07-05 — End: 2018-12-20

## 2018-07-05 NOTE — Telephone Encounter (Signed)
LMTCB  CCR: Please relay message about approved Rx and assist scheduling wellness exam with PCP. Thank you!

## 2018-07-05 NOTE — Telephone Encounter (Signed)
Patient is due for yearly exam.  One refill authorized.  Please schedule follow up visit.

## 2018-07-06 NOTE — Telephone Encounter (Signed)
Left message for patient to return call to clinic.    CCR: Please schedule per below when patient calls back.  Thanks!    2nd attempt.

## 2018-07-11 ENCOUNTER — Other Ambulatory Visit (INDEPENDENT_AMBULATORY_CARE_PROVIDER_SITE_OTHER): Payer: Self-pay | Admitting: Family Medicine

## 2018-07-11 DIAGNOSIS — J302 Other seasonal allergic rhinitis: Secondary | ICD-10-CM

## 2018-07-11 MED ORDER — FLUTICASONE PROPIONATE 50 MCG/ACT NA SUSP
2.0000 | Freq: Every day | NASAL | 2 refills | Status: DC
Start: 2018-07-11 — End: 2018-08-02

## 2018-07-11 NOTE — Telephone Encounter (Signed)
The medication requested is listed as historical in the med list.

## 2018-08-02 ENCOUNTER — Encounter (INDEPENDENT_AMBULATORY_CARE_PROVIDER_SITE_OTHER): Payer: Self-pay | Admitting: Family Medicine

## 2018-08-02 ENCOUNTER — Ambulatory Visit (INDEPENDENT_AMBULATORY_CARE_PROVIDER_SITE_OTHER): Payer: No Typology Code available for payment source | Admitting: Family Medicine

## 2018-08-02 VITALS — BP 161/108 | HR 60 | Temp 98.0°F | Resp 12 | Ht 72.0 in | Wt 222.0 lb

## 2018-08-02 DIAGNOSIS — R05 Cough: Secondary | ICD-10-CM

## 2018-08-02 DIAGNOSIS — N189 Chronic kidney disease, unspecified: Secondary | ICD-10-CM

## 2018-08-02 DIAGNOSIS — R3912 Poor urinary stream: Secondary | ICD-10-CM

## 2018-08-02 DIAGNOSIS — G4733 Obstructive sleep apnea (adult) (pediatric): Secondary | ICD-10-CM | POA: Insufficient documentation

## 2018-08-02 DIAGNOSIS — C642 Malignant neoplasm of left kidney, except renal pelvis: Secondary | ICD-10-CM

## 2018-08-02 DIAGNOSIS — I10 Essential (primary) hypertension: Secondary | ICD-10-CM

## 2018-08-02 DIAGNOSIS — N401 Enlarged prostate with lower urinary tract symptoms: Secondary | ICD-10-CM

## 2018-08-02 DIAGNOSIS — J302 Other seasonal allergic rhinitis: Secondary | ICD-10-CM

## 2018-08-02 DIAGNOSIS — R809 Proteinuria, unspecified: Secondary | ICD-10-CM

## 2018-08-02 DIAGNOSIS — E1129 Type 2 diabetes mellitus with other diabetic kidney complication: Secondary | ICD-10-CM

## 2018-08-02 DIAGNOSIS — E785 Hyperlipidemia, unspecified: Secondary | ICD-10-CM

## 2018-08-02 DIAGNOSIS — R059 Cough, unspecified: Secondary | ICD-10-CM

## 2018-08-02 DIAGNOSIS — Z6830 Body mass index (BMI) 30.0-30.9, adult: Secondary | ICD-10-CM

## 2018-08-02 LAB — COMPREHENSIVE METABOLIC PANEL
ALT (GPT): 18 U/L (ref 10–48)
AST (GOT): 16 U/L (ref 9–38)
Albumin: 4.2 g/dL (ref 3.5–5.2)
Alkaline Phosphatase (Total): 72 U/L (ref 37–159)
Anion Gap: 2 — ABNORMAL LOW (ref 4–12)
Bilirubin (Total): 0.5 mg/dL (ref 0.2–1.3)
Calcium: 9 mg/dL (ref 8.9–10.2)
Carbon Dioxide, Total: 30 meq/L (ref 22–32)
Chloride: 106 meq/L (ref 98–108)
Creatinine: 1.54 mg/dL — ABNORMAL HIGH (ref 0.51–1.18)
GFR, Calc, African American: 57 mL/min/{1.73_m2} — ABNORMAL LOW (ref >59)
GFR, Calc, European American: 47 mL/min/{1.73_m2} — ABNORMAL LOW (ref >59)
Glucose: 159 mg/dL — ABNORMAL HIGH (ref 62–125)
Potassium: 3.9 meq/L (ref 3.6–5.2)
Protein (Total): 6.9 g/dL (ref 6.0–8.2)
Sodium: 138 meq/L (ref 135–145)
Urea Nitrogen: 16 mg/dL (ref 8–21)

## 2018-08-02 LAB — ALBUMIN/CREATININE RATIO, RANDOM URINE
Albumin (Micro), URN: 235.84 mg/dL
Albumin/Creatinine Ratio, URN: 732 mg/g{creat} — ABNORMAL HIGH (ref <30)
Creatinine/Unit, URN: 322 mg/dL

## 2018-08-02 LAB — PR A1C RAPID, ONSITE: Hemoglobin A1C: 7.5 % — ABNORMAL HIGH (ref 4.0–6.0)

## 2018-08-02 LAB — CBC (HEMOGRAM)
Hematocrit: 46 % (ref 38–50)
Hemoglobin: 14.7 g/dL (ref 13.0–18.0)
MCH: 28.2 pg (ref 27.3–33.6)
MCHC: 32.2 g/dL (ref 32.2–36.5)
MCV: 88 fL (ref 81–98)
Platelet Count: 283 10*3/uL (ref 150–400)
RBC: 5.21 10*6/uL (ref 4.40–5.60)
RDW-CV: 14.9 % — ABNORMAL HIGH (ref 11.6–14.4)
WBC: 6.81 10*3/uL (ref 4.3–10.0)

## 2018-08-02 LAB — VITAMIN B12 (COBALAMIN): Vitamin B12 (Cobalamin): 590 pg/mL (ref 180–914)

## 2018-08-02 MED ORDER — FLUTICASONE PROPIONATE 50 MCG/ACT NA SUSP
2.0000 | Freq: Every day | NASAL | 6 refills | Status: DC
Start: 2018-08-02 — End: 2019-06-08

## 2018-08-02 MED ORDER — METOPROLOL SUCCINATE ER 25 MG OR TB24
50.0000 mg | EXTENDED_RELEASE_TABLET | Freq: Every day | ORAL | 6 refills | Status: DC
Start: 2018-08-02 — End: 2019-08-10

## 2018-08-02 MED ORDER — NIFEDIPINE ER 30 MG OR TB24
30.0000 mg | EXTENDED_RELEASE_TABLET | Freq: Two times a day (BID) | ORAL | 6 refills | Status: DC
Start: 2018-08-02 — End: 2019-07-24

## 2018-08-02 MED ORDER — LISINOPRIL 10 MG OR TABS
10.0000 mg | ORAL_TABLET | Freq: Two times a day (BID) | ORAL | 6 refills | Status: DC
Start: 2018-08-02 — End: 2019-06-17

## 2018-08-02 MED ORDER — METFORMIN HCL 1000 MG OR TABS
1000.0000 mg | ORAL_TABLET | Freq: Two times a day (BID) | ORAL | 6 refills | Status: DC
Start: 2018-08-02 — End: 2019-07-24

## 2018-08-02 MED ORDER — ALBUTEROL SULFATE HFA 108 (90 BASE) MCG/ACT IN AERS
2.0000 | INHALATION_SPRAY | RESPIRATORY_TRACT | 11 refills | Status: DC | PRN
Start: 2018-08-02 — End: 2020-11-18

## 2018-08-02 MED ORDER — ATORVASTATIN CALCIUM 40 MG OR TABS
40.0000 mg | ORAL_TABLET | Freq: Every day | ORAL | 11 refills | Status: DC
Start: 2018-08-02 — End: 2019-08-10

## 2018-08-02 NOTE — Progress Notes (Signed)
Chief Complaint   Patient presents with   . Wellness   . Referral     sleep apnea       Subjective:     Sean Oliver is a 58 year old male who presents on 08/02/2018 for the following concerns:     Concern for sleep apnea: has been to clinic and prescribed CPAP but was heavier at that time. Now has frequent awakenings, awakens tired in am.   DMII: States that A1C was 7.1 at [facility] within past 3 months. Checks glucoses qod and reports that usually 120-125 fasting, denies hypoglycemia. Recently restarted exercising. Had eye exam in past 6 months.   HTN: misses about 2 days/week. Took 5 minutes before coming to visit today. Denies chest pain, HA, shortness of breath.   CKD: Dr. Wynell Balloon had planned on f/u in Aug, 2019 but has not had return visit. Per TE recommended tamsulosin. States that has intermittent frequency, weak stream and nocturia 3x/night. No med started.   Renal mass: had MRI with stable L renal mass in 10/19. No urology f/u since.    I reviewed the patient's recorded medical history, and confirmed the following: medications, problem list, allergies and past medical history    ROS    Objective:   Vitals: BP (!) 161/108 Comment: 2nd reading  Pulse 60   Temp 98 F (36.7 C) (Temporal)   Resp 12   Ht 6' (1.829 m)   Wt (!) 222 lb (100.7 kg)   SpO2 98%   BMI 30.11 kg/m    Physical Exam   Constitutional: He appears well-developed and well-nourished. No distress.   HENT:   Head: Normocephalic and atraumatic.   Eyes: Right eye exhibits no discharge. Left eye exhibits no discharge. No scleral icterus.   Cardiovascular: Normal rate and regular rhythm. Exam reveals no gallop and no friction rub.   No murmur heard.  Pulmonary/Chest: Effort normal and breath sounds normal. No respiratory distress. He has no wheezes. He has no rales.   Genitourinary:    Genitourinary Comments: Declines exam     Neurological: He is alert.   Skin: Skin is warm and dry.   Psychiatric: He has a normal mood and  affect. His behavior is normal.       IPSS: 7    Assessment and Plan:   1. OSA (obstructive sleep apnea)  Not currently on CPAP but would like to be.  - REFERRAL TO SLEEP [facility]ORDER [facility]    2. Controlled type 2 diabetes mellitus with microalbuminuria, without long-term current use of insulin  At goal per report, but will repeat labs as below.  - A1C RAPID, ONSITE  - Vitamin B12 (Cobalamin)  - URINE SCREEN, MICROALBUMINURIA  - metFORMIN 1000 MG tablet; Take 1 tablet (1,000 mg) by mouth 2 times a day.  Dispense: 60 tablet; Refill: 6    3. Essential hypertension  4. Chronic kidney disease, unspecified CKD stage  Above goal <140/90 but declines med change today. Will continue exercise program. Encourage f/u with Dr. Wynell Balloon asap.   - metoprolol succinate ER 25 MG 24 hr tablet; Take 2 tablets (50 mg) by mouth daily. Do not chew or crush.  Dispense: 180 tablet; Refill: 6  - NIFEdipine ER 30 MG tablet extended-release 24 hour; Take 1 tablet (30 mg) by mouth 2 times a day.  Dispense: 180 tablet; Refill: 6  - Comprehensive Metabolic Panel  - CBC  - lisinopril 10 MG tablet; Take 1 tablet (10 mg) by  mouth every 12 hours. DOSE INCREASE  Dispense: 180 tablet; Refill: 6    5. Benign prostatic hyperplasia with weak urinary stream  Reviewed pathophys and consequences esp on kidneys from  BPH but declines and would prefer to talk with specialists. Will f/u next visit and discuss PVR.    6. Renal cell carcinoma of left kidney (HCC)  Encouraged f/u with Dr. Jodi Mourning to discuss options.     7. Cough  - albuterol HFA 108 (90 Base) MCG/ACT inhaler; Inhale 2 puffs by mouth every 4 hours as needed for shortness of breath/wheezing.  Dispense: 1 Inhaler; Refill: 11    8. Hyperlipidaemia  - atorvastatin 40 MG tablet; Take 1 tablet (40 mg) by mouth daily.  Dispense: 30 tablet; Refill: 11    9. Seasonal allergic rhinitis, unspecified trigger  - fluticasone propionate 50 MCG/ACT nasal spray; Spray 2 sprays into each nostril daily.  Dispense: 16 g;  Refill: 6    Follow-up: 3 mo     Matthew Saras, MD  [address]  [place] [address]  [phone]

## 2018-08-02 NOTE — Patient Instructions (Addendum)
-  Please send me results from diabetic eye test  -Please make appointment with Dr. Wynell Balloon (kidney doctor): [facility] [phone] (who had wanted to see you in August, 2019) and Dr. Jodi Mourning [facility] [phone] (who had wanted to see you in October, 2019).

## 2018-08-08 ENCOUNTER — Telehealth (HOSPITAL_BASED_OUTPATIENT_CLINIC_OR_DEPARTMENT_OTHER): Payer: Self-pay | Admitting: Sleep Medicine

## 2018-08-08 ENCOUNTER — Telehealth (INDEPENDENT_AMBULATORY_CARE_PROVIDER_SITE_OTHER): Payer: Self-pay | Admitting: Family Medicine

## 2018-08-08 NOTE — Telephone Encounter (Signed)
RETURN CALL: Voicemail - Detailed Message      SUBJECT:  Appointment Request     REASON FOR VISIT: OSA, per referral  PREFERRED DATE/TIME: to be discussed  ADDITIONAL INFORMATION: no scheduling instructions

## 2018-08-08 NOTE — Telephone Encounter (Signed)
Received PA request for metoprolol succinate ER 25 MG 24 hr tablet .     Requesting Pharmacy: [id number] [place] [phone]    Phone #    Fax #        Reason for PA: [address] (PHARMACY HELP DESK [phone]) MAXIMUM DAYS SUPPLY OF 30          Called pharmacy.  Disregard this request. Quantity was ran incorrect.  Patient has picked up his 30 day supply for this month already and 60 covered.

## 2018-08-09 ENCOUNTER — Encounter (HOSPITAL_BASED_OUTPATIENT_CLINIC_OR_DEPARTMENT_OTHER): Payer: Self-pay

## 2018-08-09 ENCOUNTER — Other Ambulatory Visit (INDEPENDENT_AMBULATORY_CARE_PROVIDER_SITE_OTHER): Payer: Self-pay | Admitting: Family Medicine

## 2018-08-09 DIAGNOSIS — E1129 Type 2 diabetes mellitus with other diabetic kidney complication: Secondary | ICD-10-CM

## 2018-08-09 DIAGNOSIS — R809 Proteinuria, unspecified: Secondary | ICD-10-CM

## 2018-08-09 NOTE — Result Encounter Note (Signed)
Released via ecare, see message from today

## 2018-08-10 NOTE — Progress Notes (Signed)
UROLOGY CLINIC NOTE    Reason for Visit:    Chief Complaint   Patient presents with   . Follow-Up      History of Present Illness:  Sean Oliver is a 58 year old male who returns to me for follow up for a 2.2 cm left renal mass on active surveillance.     His renal mass was diagnosed on renal US for proteinuria on 04/28/2017. CT showed a 2.2 cm  enhancing lesion with rapid washout in interpolar region of the left kidney. Subsequent 05/10/2017 MRI demonstrated 1.8 cm solid renal mass in left interpolar kidney. His creatinine has been elevated to 1.5 on 05/10/2017.     He has high creatinine levels, most recently 1.54 on 08/02/2018. Risk factors for CKD include HTN, DM, and h/o obesity.    Other medical problems include stage 3 CKD, hypertension and type II DM. History of diverticulitis, treated with colectomy twice, and once with mesh. Five feet of intestine were removed with no ostomy placed. History of incisional hernia. History of OSA on CPAP, discontinued due to purposeful weight loss and improvement of sleep apnea.     Interval History:  Met with Dr. Jari Favre in April 2019. Reviewed March 2019 CT, which demonstrated stable left renal mass. Elected to continue surveillance.     October 2019 MRI demonstrated stable left renal mass, and stable indeterminate liver lesion.     Reports bothersome nocturia after discontinuing CPAP, without daytime urinary frequency. Has an appointment with Dr. Wynell Balloon in 08/22/2018 and sleep med 09/23/2018. No blood in urine. No UTI.            Review of Systems:  Const: Neg for fever, Nochills  GU: As noted in the HPI above      Past Medical Hx:    Past Medical History:   Diagnosis Date   . Cancer ([place])    . Diabetes mellitus ([place])    . Hypertension    . Kidney disease    . Lipidemia    . Lung disease    . Type II or unspecified type diabetes mellitus without mention of complication, not stated as uncontrolled        Past Surgical Hx:    Past Surgical History:   Procedure  Laterality Date   . left eye surgery x 5     . RPR INGUN HERNIA SLIDING ANY AGE      many years ago.  bilateral.       Past Family and Social Hx:    Mr. Stemmer, family history includes Osteoporosis in his mother. There is no history of Cancer, Diabetes, Kidney Disorder, Hypertension, Stroke, or Heart Disease., He,  reports that he quit smoking about 3 years ago. His smoking use included cigars. He has never used smokeless tobacco. He reports current alcohol use. He reports that he does not use drugs..    Social History     Social History Narrative    Married. Lives in [place]. Previously in [employer]. Retired from [employer].       Active Meds:    Outpatient Medications Marked as Taking for the 08/15/18 encounter (Office Visit) with Edilia Bo, MD   Medication Sig Dispense Refill   . albuterol HFA 108 (90 Base) MCG/ACT inhaler Inhale 2 puffs by mouth every 4 hours as needed for shortness of breath/wheezing. 1 Inhaler 11   . atorvastatin 40 MG tablet Take 1 tablet (40 mg) by mouth daily. 30 tablet 11   .  fluticasone propionate 50 MCG/ACT nasal spray Spray 2 sprays into each nostril daily. 16 g 6   . lisinopril 10 MG tablet Take 1 tablet (10 mg) by mouth every 12 hours. DOSE INCREASE 180 tablet 6   . metFORMIN 1000 MG tablet Take 1 tablet (1,000 mg) by mouth 2 times a day. 60 tablet 6   . metoprolol succinate ER 25 MG 24 hr tablet Take 2 tablets (50 mg) by mouth daily. Do not chew or crush. 180 tablet 6   . NIFEdipine ER 30 MG tablet extended-release 24 hour Take 1 tablet (30 mg) by mouth 2 times a day. 180 tablet 6   . SILDENAFIL 100 MG tablet Take 1 tablet by mouth daily as needed for erectile dysfunction. Don't take with other blood pressure meds. 30 tablet 0       Allergies:  Review of patient's allergies indicates:  No Known Allergies    OBJECTIVE:  Physical Exam:  BP (!) 138/90   Pulse 61   Temp 99.4 F (37.4 C) (Temporal)   Ht 6' (1.829 m)   Wt 218 lb 11.1 oz (99.2 kg)   SpO2 98%    BMI 29.66 kg/m   General:  No apparent distress, well-nourished  Extremities:  No peripheral edema       I reviewed and discussed laboratory results with the patient today.   Laboratory review:    Results for orders placed or performed in visit on 08/02/18   Comprehensive Metabolic Panel   Result Value Ref Range    Sodium 138 135 - 145 meq/L    Potassium 3.9 3.6 - 5.2 meq/L    Chloride 106 98 - 108 meq/L    Carbon Dioxide, Total 30 22 - 32 meq/L    Anion Gap 2 (L) 4 - 12    Glucose 159 (H) 62 - 125 mg/dL    Urea Nitrogen 16 8 - 21 mg/dL    Creatinine 1.54 (H) 0.51 - 1.18 mg/dL    Protein (Total) 6.9 6.0 - 8.2 g/dL    Albumin 4.2 3.5 - 5.2 g/dL    Bilirubin (Total) 0.5 0.2 - 1.3 mg/dL    Calcium 9.0 8.9 - 10.2 mg/dL    AST (GOT) 16 9 - 38 U/L    Alkaline Phosphatase (Total) 72 37 - 159 U/L    ALT (GPT) 18 10 - 48 U/L    GFR, Calc, European American 47 (L) >59 mL/min/[1.73_m2]    GFR, Calc, African American 57 (L) >59 mL/min/[1.73_m2]    GFR, Information       Calculated GFR in mL/min/1.73 m2 by MDRD equation.  Inaccurate with changing renal function.  See http://[email or web address].html         I reviewed and discussed radiology results with the patient today.   Radiology review:  03/22/2018 MRI Abdomen WO/W Con:  IMPRESSION:  1. Solid mass in the left kidney mid zone is unchanged in size or appearance   compared to MRI 05/10/2017. No new lesions.  2. T2 hyperintense lesion in segment 6 of the liver is unchanged compared to   MRI 05/10/2017, and remains indeterminate. Metastatic disease is less likely   given its stability, though not excluded. If further characterization is   desired, suggest MRI Eovist to evaluate for focal nodular hyperplasia.    08/16/2017 CT Abdomen WO/W Con:  IMPRESSION:  No significant interval change of left kidney solid, non-fat-containing mass.   Based on: (i) dynamic enhancement characteristics (mild degree of   contrast-enhancement), (  ii) lack of angular interface  with the neighboring   normal parenchyma, (iii) high DWI restriction (low apparent diffusion   coefficient value), (iv) mildly intrinsically T1 high signal and iso-intensity   on T2-weighted images with possible pseudocapsule, leading histologies are   low-grade type I papillary and chromophobe types cell renal cell carcinoma;   other histologies, including clear cell renal cell carcinoma, oncocytoma and   minimal-fat angiomyolipoma, are considered less likely in light of the   aforementioned imaging features.  -No invasion of renal sinus, collecting system or Gerota's fascia.  -No renal vein or IVC thrombus. No abdominal adenopathy or visceral   metastases.    05/10/2017 MRI Abdomen WO/W Con:  IMPRESSION:  1. Redemonstration of solid renal mass in the left interpolar kidney measuring  1.8 cm. Characteristics suggest papillary or chromophobe type renal cell   carcinoma, less likely oncocytoma or metastasis from primary malignancy   elsewhere.  2. No evidence of metastasis or renal vein invasion.    05/04/2017 CT Abs WO/W Con:  IMPRESSION:  1. In the interpolar region of the left kidney, there is a 2.2 cm expansile   enhancing lesion with rapid washout, which is concerning for renal cell   carcinoma. No filling defects are noted in the opacified portions of the   collecting system.        Discussion: We again discussed the nature of renal tumors. We reviewed that his Oct 2019 MRI demonstrated that his left renal mass is stable, unchanged in size. Reviewed expected growth of renal masses is approximately 70m per year. We discussed that the unchanged size and appearance of his renal mass over the past year is encouraging that, if this is a kidney cancer, it is unlikely to be aggressive.     We again discussed treatment options, which include active surveillance, excisional therapy in the form of partial or radical nephrectomy or ablative therapy in the form of cryoablation or radiofrequency ablation. Discussed that  given the stability on imaging, I would not recommend active intervention at this time. He elects to remain on active surveillance at this time. Will repeat MRI Abdomen in Oct 2020 (one year from last MRI). He would be a candidate for cryo in the future if there is change, a biopsy would be recommended at that time.    Discussed that untreated OSA can contribute to nocturia. This etiology is supported by his history of improvement of nocturia on CPAP, with worsening nocturia after discontinuing use of CPAP. If his symptoms persist with negative sleep med evaluation, may consider medication treatment for nocturia.     Recommend maintaining a healthy weight, and continued management of his diabetes to preserve overall renal function.       IMPRESSION/PLAN:   1) Left renal mass. Stable. Repeat MRI Abdomen in Oct 2020.   2) Nocturia. Pending sleep med evaluation. May consider medication treatment in the future such as a trial of tamsulosin but he would like to hold off on this for now.      08/15/2018 @ 8:25 AM - I, NJerene Bearsacted as a sEducation administratorand documented the service/procedure performed to the best of my knowledge in the presence of JVonita Moss MD who will provide the final review and authentication.  Signed: NJerene Bears

## 2018-08-15 ENCOUNTER — Ambulatory Visit: Payer: No Typology Code available for payment source | Admitting: Urology

## 2018-08-15 ENCOUNTER — Encounter (INDEPENDENT_AMBULATORY_CARE_PROVIDER_SITE_OTHER): Payer: Self-pay | Admitting: Urology

## 2018-08-15 VITALS — BP 138/90 | HR 61 | Temp 99.4°F | Ht 72.0 in | Wt 218.7 lb

## 2018-08-15 DIAGNOSIS — Z6829 Body mass index (BMI) 29.0-29.9, adult: Secondary | ICD-10-CM

## 2018-08-15 DIAGNOSIS — N2889 Other specified disorders of kidney and ureter: Secondary | ICD-10-CM

## 2018-08-15 DIAGNOSIS — C642 Malignant neoplasm of left kidney, except renal pelvis: Secondary | ICD-10-CM

## 2018-08-15 NOTE — Progress Notes (Signed)
I, Jonathan D Harper, MD, personally performed the services described in this documentation, as scribed by Nicole Mostofi in my presence, and it is both accurate and complete.

## 2018-08-22 ENCOUNTER — Ambulatory Visit: Payer: No Typology Code available for payment source | Attending: Nephrology | Admitting: Nephrology

## 2018-08-22 VITALS — BP 136/87 | HR 60 | Temp 98.2°F | Ht 72.01 in | Wt 226.2 lb

## 2018-08-22 DIAGNOSIS — N183 Chronic kidney disease, stage 3 unspecified: Secondary | ICD-10-CM

## 2018-08-22 DIAGNOSIS — I10 Essential (primary) hypertension: Secondary | ICD-10-CM

## 2018-08-22 DIAGNOSIS — Z6830 Body mass index (BMI) 30.0-30.9, adult: Secondary | ICD-10-CM

## 2018-08-22 DIAGNOSIS — R809 Proteinuria, unspecified: Secondary | ICD-10-CM

## 2018-08-22 DIAGNOSIS — G4733 Obstructive sleep apnea (adult) (pediatric): Secondary | ICD-10-CM | POA: Insufficient documentation

## 2018-08-22 DIAGNOSIS — E1129 Type 2 diabetes mellitus with other diabetic kidney complication: Secondary | ICD-10-CM

## 2018-08-22 NOTE — Progress Notes (Signed)
NEPHROLOGY CLINIC FOLLOW UP NOTE  08/22/2018            REFERRING PHYSICIAN:   Morhaf Al Achkar     INITIAL CONSULT DATE: 05/28/2017      Past Medical History:   Diagnosis Date   . Cancer ([place])    . Diabetes mellitus ([place])    . Hypertension    . Kidney disease    . Lipidemia    . Lung disease    . Type II or unspecified type diabetes mellitus without mention of complication, not stated as uncontrolled      Past Surgical History:   Procedure Laterality Date   . left eye surgery x 5     . RPR INGUN HERNIA SLIDING ANY AGE      many years ago.  bilateral.        HISTORY OF PRESENT ILLNESS: Sean Oliver is a 58 year old male with past medical history significant for the above mentioned problems.    CC: Follow up on CKD and Hypertension     Based on verbal report, past medical history is significant for:    Diabetes mellitus of at least 15 years duration, though has been very well controlled over this period of time.  He was initially started off on insulin but subsequently has been switched to metformin only with good glycemic control.  By his report, he does not know of any end organ damage including absence of any diabetic retinopathy or neuropathy.  Also has long-standing hypertension of at least 20 years duration.  While he has not checked his pressures regularly at home, he reports that for the most part at doctors visits he was told that the blood pressure is only marginally elevated.  Prolonged time he was on combination lisinopril with hydrochlorothiazide, that was changed subsequently to alternative agents.  One reasoning for stopping lisinopril was to determine if it would lead to an improvement in creatinine/GFR.  However, over the last few months patient has been experiencing uncontrolled hypertension with most readings in the 160s-170s/90s-100 range.  He is not specifically expressing any symptoms related to these uncontrolled readings and denies any history of TIAs/CVAs.  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  Also had considerable issues with diverticulitis leading to laparotomy with limited resection as well in the past.    During the recent follow-ups with primary care physician and in effort to undertake a diagnostic evaluation for his chronic kidney disease, both urinary and imaging studies were undertaken.  Urinalyses now revealing increased albuminuria while imaging studies including ultrasound, CT scan and MRI also indicating around 2 cm left-sided interpolar mass most compatible with renal cell carcinoma.  He has not experienced any specific flank pains, dysuria, renal colic or hematuria.  Similarly no major change in urinary habit and no problems with dependent edema.    No history of nephrolithiasis, recurrent urinary tract infections or pyelonephritis.  Only occasional use of NSAIDs with no over-the-counter/herbal supplement use.    Interval History:  No acute illnesses, competitions or hospitalizations in the interim.  Overall, he has fared well with no acute issues.  Seems like hypertension has progressively gotten much better controlled since last blood pressure medication changes including switching amlodipine with nifedipine.   His principal complaint pertains to nocturia anywhere between 2-4 times nightly.  It appears that he continues to consume liquids up until his bedtime.  Otherwise, he denies any hesitancy,  urgency, postvoid dribbling or weak urinary stream and has not been enthusiastic about the idea of starting tamsulosin trial.  He reports that he can discern this worsening nocturia since not having been able to continue with his previous CPAP.     Also is continuing to follow-up with urology for left renal lesion with regards to suspicion for renal cell carcinoma.  He has undergone a recent MRI 02/2018 (prior US & CT scan in 2019 as well) and has been encouraged to continue active surveillance as the lesion  remains unchanged in size upon follow-up.    He is already cognizant of following a low-salt diet and is trying to stay away from red meats.  He has not experienced any dyspnea, chest pain, visual problems or dependent edema.      Otherwise, patient denies any acute problems and a directed review of systems was essentially unremarkable.          ALLERGIES:    Review of patient's allergies indicates:  No Known Allergies    HOME MEDICATIONS:      Current Outpatient Medications:   .  albuterol HFA 108 (90 Base) MCG/ACT inhaler, Inhale 2 puffs by mouth every 4 hours as needed for shortness of breath/wheezing., Disp: 1 Inhaler, Rfl: 11  .  atorvastatin 40 MG tablet, Take 1 tablet (40 mg) by mouth daily., Disp: 30 tablet, Rfl: 11  .  fluticasone propionate 50 MCG/ACT nasal spray, Spray 2 sprays into each nostril daily., Disp: 16 g, Rfl: 6  .  lisinopril 10 MG tablet, Take 1 tablet (10 mg) by mouth every 12 hours. DOSE INCREASE, Disp: 180 tablet, Rfl: 6  .  metFORMIN 1000 MG tablet, Take 1 tablet (1,000 mg) by mouth 2 times a day. (Patient taking differently: Take 1,000 mg by mouth daily. ), Disp: 60 tablet, Rfl: 6  .  metoprolol succinate ER 25 MG 24 hr tablet, Take 2 tablets (50 mg) by mouth daily. Do not chew or crush., Disp: 180 tablet, Rfl: 6  .  NIFEdipine ER 30 MG tablet extended-release 24 hour, Take 1 tablet (30 mg) by mouth 2 times a day., Disp: 180 tablet, Rfl: 6  .  SILDENAFIL 100 MG tablet, Take 1 tablet by mouth daily as needed for erectile dysfunction. Don't take with other blood pressure meds., Disp: 30 tablet, Rfl: 0        PHYSICAL EXAM:     Vitals Signs: BP 136/87   Pulse 60   Temp 98.2 F (36.8 C) (Temporal)   Ht 6' 0.01" (1.829 m)   Wt (!) 226 lb 3.1 oz (102.6 kg)   SpO2 97% Comment: ra  BMI 30.67 kg/m .    Constitutional: Sitting comfortably in chair, in no apparent distress.  Eyes:  Pupils normal size, EOM intact  Mouth/Throat: Mucous membranes are moist,  no oropharyngeal lesions  Neck: No  JVD or carotid bruits  Lungs: No rhonchi or creptitus  Heart: S1, S2 with II/VI ESM, no rubs/ gallops  Abdomen: Soft, nontender; bowel sounds normal  Extremities: No pretibial or pedal edema,    Neuro: Alert O X3, no focal deficiits  Skin: No rash        DIAGNOSTIC STUDIES:    Results for HOANG, PETTINGILL (MRN [id number]) as of 08/22/2018 15:32   04/19/2017 11:01 07/05/2017 16:47 09/16/2017 11:03 08/02/2018 11:06   Sodium 141 144 140 138   Potassium 3.8 3.9 3.7 3.9   Chloride 106 107 106 106   Carbon Dioxide, Total  29 30 30 30    Anion Gap 6 7 4 2  (L)   Glucose 102 117 238 (H) 159 (H)   Urea Nitrogen 19 18 14 16    Creatinine 1.61 (H) 1.51 (H) 1.49 (H) 1.54 (H)   GFR, Calc, European American 45 (L) 48 (L) 49 (L) 47 (L)   Calcium 9.2 9.6 9.4 9.0   Phosphate  3.5 2.8          MRI 03/23/2018:  1. Solid mass in the left kidney mid zone is unchanged in size or appearance compared to MRI 05/10/2017. No new lesions.     2. T2 hyperintense lesion in segment 6 of the liver is unchanged compared to MRI 05/10/2017, and remains indeterminate. Metastatic disease is less likely given its stability, though not excluded. If further characterization is   desired, suggest MRI Eovist to evaluate for focal nodular hyperplasia.         ASSESSMENT AND PLAN:    -Chronic Kidney Disease Stage 3  Review of available labs since at least 2015 show a creatinine ranging from 1.27-1.61 (and one value obtained through [facility] from 04/28/1996 of 1.85m/dL).  This indicates long-standing element of chronic kidney disease with eGFR likely in the ~ 55 mL/minute range albeit a more or less stable course over this period of time with minimal progression.  Last serum creatinine at 1.54 with eGFR 57 mL/minute, that indicates overall stable status without noticeable progression.  While current albuminuria is around 7022m(total proteinuria ~1g), his chronic kidney disease appears principally from hypertensive nephrosclerosis (corroborated by findings of  LVH on ECHO as well), some parenchymal loss from cysts and possibly an element of diabetic nephropathy.  Ultrasound of the kidneys is instructive in this regard indicating increased echogenicity from interstitial fibrosis.  Previously, a monoclonal disorder was ruled out with urine and serum electrophoresis studies as well as free light chain assays.      With regards to need for kidney biopsy, if patient undergoes partial nephrectomy for presumptive renal cell carcinoma in his left kidney, will review the kidney tissue for any primary renal pathology.  Needs continued aggressive risk factor modification.      -Hypertension, Primary  Blood pressure control has improved since switching amlodipine to nifedipine XL 30 mg twice a day and continuing with metoprolol 50 mg daily with lisinopril 10 mg BID.       -Diabetes mellitus type 2, controlled  Overall stable control of diabetes mellitus with last hemoglobin A1c of 6.4- 7.5% and self-reported absence of any diabetic retinopathy or neuropathy despite the long vintage.  Continue  lisinopril for better blood pressure control and for maximum anti-proteinuric effect.  Given his GFR, there is no contraindication for continued use of metformin.  Proteinuria remains ~1g with albuminuria on last evaluation ~700 mg.  With a possible element of diabetic nephropathy, consideration could be given to initiating SGLT2i therapy.  In this regard, primary care physician can determine if additional hypoglycemic therapy is indicated for improved metabolic control.    -Renal cell carcinoma   Imaging studies showing a 2 cm lesion in the interpolar region of the left kidney, likely renal cell carcinoma.  Is being actively followed in urology clinic where pros and cons of conservative versus surgical therapy were discussed.  On the basis of previous ultrasound and CT imaging and recent MRI 02/2018 showing overall stable size without any evidence of progression, urology clinic condition is to  continue to pursue active surveillance only.        -  Nocturia  Advised him to cut out all fluids after dinner.  Also agree that they could be component of increased ANP secretion from right atrial stretch related to pulmonary hypertension from obstructive sleep apnea.  In this regard, he will likely benefit from resuming CPAP therapy which will also likely help in additional/ improved antihypertensive control.  Strictly based on clinical symptoms, appears less likely that symptoms are related to prostatism.  However, he follows with urology clinic and can be evaluated for that on follow-up visits.      Sean Hausen, MD, 08/22/2018  Division of Nephrology  [facility]  [place], [place]

## 2018-09-15 ENCOUNTER — Encounter (HOSPITAL_BASED_OUTPATIENT_CLINIC_OR_DEPARTMENT_OTHER): Payer: Self-pay | Admitting: Unknown Physician Specialty

## 2018-09-21 ENCOUNTER — Encounter (HOSPITAL_BASED_OUTPATIENT_CLINIC_OR_DEPARTMENT_OTHER): Payer: Self-pay

## 2018-09-23 ENCOUNTER — Ambulatory Visit (HOSPITAL_BASED_OUTPATIENT_CLINIC_OR_DEPARTMENT_OTHER): Payer: No Typology Code available for payment source | Attending: Family Medicine | Admitting: Adult Health

## 2018-09-23 DIAGNOSIS — R0683 Snoring: Secondary | ICD-10-CM

## 2018-09-23 DIAGNOSIS — G4719 Other hypersomnia: Secondary | ICD-10-CM

## 2018-09-23 DIAGNOSIS — G4733 Obstructive sleep apnea (adult) (pediatric): Secondary | ICD-10-CM

## 2018-09-23 NOTE — Patient Instructions (Signed)
Today we talked about:     -You are at risk of having Sleep Apnea.       We will call to schedule your sleep study for in the lab or at home. We will notify you of the sleep study results, which can take up to several weeks.       If you have sleep apnea, and you opted to expedite your treatment, then  PAP (Positive Airway Pressure) therapy is recommended, we will send the study results to a DME ([employer]) company for them to provide you with your own PAP machine.     Please start using your PAP device every time you sleep. Because most insurances have requirements you must meet for them to pay for your PAP machine. These requirements are:      -You will need to return to clinic in 5-7 weeks after receiving the machine.     -The PAP needs to be used at least 4 hours per night for 70% of nights during a 30 day period.      If you have any questions or concerns, please contact the Call Center at [phone] or email me via eCare (preffered method).     If you have no problems with your treatment, I'll see you after you have been using it for 5-7 weeks.Please bring your PAP machine with you to the return visit.     Thank you for coming in today. During today's visit, we reviewed only your sleep related medications. Please follow up with your Primary Care Provider for any questions related to your other medications.     Positional Therapy    Your sleep apnea may be worse when you sleep on your back.  Below are a number of devices that can help you stay off your back when you sleep.     Sleep position retrainer devices detect when sleeping on the back and vibrate, which encourages you to reposition to sleep off your back without awakening.    1. NightShift Sleep Positioner is a collar that records sleep position and provides a vibration signal.  See ClosetRepublicans.fi.  If you wish to order one, use the prescription provided or call us at [phone] or your Sleep Medicine provider for a  prescription.  2.SomnoPose ($4) is a smartphone app that detects and re-trains sleep position.  It measures sleep position and activates a vibration alarm when sleeping on your back, in order to train you in your sleep to stay off your back.    3. Phillips night balance- waist vibrator  that comes with an app to monitor your progress(requires prescription).      There are a number of sleep positioner devices to help you stay off your back during sleep, such as back bolsters, mattresses, pillows, and others.  These websites have examples of sleep positioner products:    www.slumberbump.com  www.rematee.com  www.zzomaosa.com  www.amazon.com: search "u shaped body pillow"      **Many of these options can be purchased on http://www.[place]warren.com/**    Tennis ball method:  By sewing a tennis ball into a patch on the back of a t-shirt, you can likewise prevent yourself from sleeping on your back. If you move to your back the ball will cause pressure that will wake you and encourage you to move to your sides. If you lack sewing skills, you might look for a loose article of clothing with a pocket on the back (like pajama pants) or one that you  can simply wear backward.    Alternatively, sleeping with your head and upper body ramped up can reduce sleep apnea.  This sleep position can be achieved with an adjustable bed, bed wedge, and mattress prop (ie, place plywood under the head & torso part of the mattress and place blocks under the plywood to ramp up the mattress).     During today's visit, we reviewed only your sleep related medications. Please follow up with your Primary Care Provider for any questions related to your other medications.     Best,  [facility]

## 2018-09-23 NOTE — Progress Notes (Signed)
[place]    Distant Site Telemedicine Encounter    I conducted this encounter from [facility] via secure, live, face-to-face video conference with the patient. Sean Oliver was located at home with wife, Sean Oliver.  Prior to the interview, the risks and benefits of telemedicine were discussed with the patient and verbal consent was obtained.   Thank you for the opportunity to participate in this patient's care.    REFERRING PROVIDER: Matthew Saras  PRIMARY CARE PROVIDER: Matthew Saras, MD    CC: Sleep apnea    The patient was referred by Matthew Saras, MD to evaluate OSA    HISTORY OF PRESENT ILLNESS  Sean Oliver is a 58 year old male with history of OSA, hypertension, CKD and T2DM presenting with needing a new sleep study.. The patient has had a sleep study before about 10 years ago and used CPAP for several years.  He lost 75 pounds and noticed that his snoring improved so he stopped using the CPAP.  For the last several years his snoring has slowly worsened and his wife has seen him gasp for air while sleeping.  Before his previous sleep study he was falling asleep during the day.  This has also become an issue and he notices that it is hard for him to stay awake while watching TV or reading..    Typical Sleep Habits:  During the week the patient is usually in bed by 9;30pm and in few minutes. He estimates he wakes 4-5 times per night. He wakes up a between 7-8am. He does not use a alarm clock. He does nit wake feeling refreshed.   Sleep schedule is similar on weekdays and weekends.  The patient does not take naps.     The patient endorses the following nocturnal symptoms of sleep apnea including: habitual snoring, recurrent awakenings, gasping/choking and frequent nocturia.     His morning symptoms are dry mouth/throat and nasal congestion.     He notes daytime symptoms of difficulty concentrating and excessive daytime sleepiness.    Patient denies drowsy driving.      Other sleep symptoms:    The patient denies bruxism or nightmares.   The patient has no parasomnias such as sleep walking/talking or dream enactment.  He has no hypnogogic or hypnopompic hallucinations, cataplexy or sleep paralysis.  The patient denies symptoms consistent with restless legs syndrome.    REVIEW OF SYSTEMS  I draw attention to the following endorsed items: Heartburn and seasonal allergies  Complete ROS was otherwise negative in relation to the chief complaint, except as documented in the HPI.     PAST MEDICAL HISTORY  Past Medical History:   Diagnosis Date   . Cancer ([place])    . Diabetes mellitus ([place])    . Hypertension    . Kidney disease    . Lipidemia    . Lung disease    . Type II or unspecified type diabetes mellitus without mention of complication, not stated as uncontrolled      Past Surgical History:   Procedure Laterality Date   . left eye surgery x 5     . RPR INGUN HERNIA SLIDING ANY AGE      many years ago.  bilateral.       MEDICATIONS    Outpatient Medications Marked as Taking for the 09/23/18 encounter (Phone Visit) with Karene Fry, ARNP   Medication Sig Dispense Refill   . albuterol HFA 108 (90 Base)  MCG/ACT inhaler Inhale 2 puffs by mouth every 4 hours as needed for shortness of breath/wheezing. 1 Inhaler 11   . atorvastatin 40 MG tablet Take 1 tablet (40 mg) by mouth daily. 30 tablet 11   . fluticasone propionate 50 MCG/ACT nasal spray Spray 2 sprays into each nostril daily. 16 g 6   . lisinopril 10 MG tablet Take 1 tablet (10 mg) by mouth every 12 hours. DOSE INCREASE 180 tablet 6   . metFORMIN 1000 MG tablet Take 1 tablet (1,000 mg) by mouth 2 times a day. (Patient taking differently: Take 1,000 mg by mouth daily. ) 60 tablet 6   . metoprolol succinate ER 25 MG 24 hr tablet Take 2 tablets (50 mg) by mouth daily. Do not chew or crush. 180 tablet 6   . NIFEdipine ER 30 MG tablet extended-release 24 hour Take 1 tablet (30 mg) by mouth 2 times a day. 180 tablet 6   .  SILDENAFIL 100 MG tablet Take 1 tablet by mouth daily as needed for erectile dysfunction. Don't take with other blood pressure meds. 30 tablet 0       ALLERGIES   Patient has no known allergies.     SOCIAL HISTORY  Marital Status: married  Lives with: wife and triplets  Work Status:Full time employment -[employer]   How often do you drink alcoholic beverages? Less than once a month  Tobacco use: Former Smoker  Any current recreational drug use:NO  Caffeine use: 1 can of soda     FAMILY HISTORY  Family History     Problem (# of Occurrences) Relation (Name,Age of Onset)    Osteoporosis (1) Mother       Negative family history of: Cancer, Diabetes, Kidney Disorder, Hypertension, Stroke, Heart Disease        There is no known family history of sleep disorders.     PHYSICAL EXAM  General: Body habitus is obese. he appears to be in no apparent distress  ENMT: Nares patent bilaterally. Nasal septum is midline. Oropharyngeal exam reveals Modified Mallampati grade 3 airway with: large tongue  ABD: soft, rotund, non tender  Lungs:  Inspection:  normal respiratory effort and chest wall movement with respiration  NEURO: CN II-XII grossly intact. A&Ox3   Neck: supple and no thyroid megaly  Psych: normal affect, good eye contact    ASSESSMENT  Sean Oliver is a 58 year old male with history of OSA, hypertension, CKD and T2DM that has signs and symptoms suggestive of sleep apnea including: snoring, recurrent awakenings, morning symptoms of dry mouth/throat, nocturia and daytime sleepiness.     I discussed the pathophysiology of sleep apnea today and the severity classifications. I also reviewed the symptoms that can be associated with untreated sleep apnea include disturbed sleep, nocturia, excessive daytime sleepiness, fatigue, memory problems, reduced concentration, and that these symptoms can improve with treatment. I counseled him that untreated moderate-to-severe sleep apnea can cause insulin resistance,  hypertension and increased risk of cardiovascular consequences such as stroke, heart attack, heart failure, cardiac arrhythmias and premature death. I also explained that chronic conditions such as anxiety, depression and pain may be harder to control in the presence of untreated sleep apnea.     I explained the process of diagnosis via in-lab polysomnography or home sleep apnea testing and I reviewed the limitations of home studies with him.     I discussed treatment options for sleep apnea, including positive airway pressure (PAP, the gold standard), weight loss,  aerobic exercise, positional therapy, mandibular advancement device (MAD), and surgery.      He indicated an interest in proceeding with a home sleep study. I advised that if the study is positive for obstructive sleep apnea, I am going to set up the patient with a trial of PAP therapy. If needed, a titration study will be requested in the lab. We will contact the patient with the results.    We reviewed the Medicare requirements for the patient to keep the machine and I explained that most insurance companies follow Medicare's guidelines. Specifically, the patient must use the device for at least 4 hours every night for 70% of nights in a consecutive 30-day period within the 90-day trial period.     I would like to see him after he has been using the equipment every night for a few weeks. I asked him to bring the equipment or a memory card to the next visit so we can download the information and troubleshoot if needed.     PLAN  -Home sleep study. If indicated, the patient agrees to a trial of PAP therapy. We briefly reviewed insurance coverage adherence criteria today.     -Side sleep until testing can be completed    -Return to clinic in 6 weeks after CPAP for follow-up, or sooner if needed.

## 2018-09-23 NOTE — Progress Notes (Signed)
Check-out completed. Scheduled PAP compliance follow-up recall.

## 2018-09-23 NOTE — Addendum Note (Signed)
Addended by: Karene Fry on: 09/23/2018 09:49 AM     Modules accepted: Orders

## 2018-10-13 ENCOUNTER — Telehealth (HOSPITAL_BASED_OUTPATIENT_CLINIC_OR_DEPARTMENT_OTHER): Payer: Self-pay | Admitting: Adult Health

## 2018-10-13 NOTE — Telephone Encounter (Signed)
RETURN CALL: Voicemail - General Message      SUBJECT:  General Message     [facility] who is home care company for CPAP  Please let him know  Thanks

## 2018-10-20 NOTE — Telephone Encounter (Signed)
Forwarded TE to provider. Please advise.

## 2018-10-24 ENCOUNTER — Encounter (INDEPENDENT_AMBULATORY_CARE_PROVIDER_SITE_OTHER): Payer: Self-pay | Admitting: Family Medicine

## 2018-10-24 NOTE — Telephone Encounter (Signed)
Relayed the message to the patient that he needs to complete a take-home SS before he can get another CPAP. He voiced understanding and very appreciative.

## 2018-10-27 ENCOUNTER — Encounter (HOSPITAL_BASED_OUTPATIENT_CLINIC_OR_DEPARTMENT_OTHER): Payer: Self-pay

## 2018-11-15 ENCOUNTER — Ambulatory Visit (HOSPITAL_BASED_OUTPATIENT_CLINIC_OR_DEPARTMENT_OTHER): Payer: No Typology Code available for payment source | Attending: Sleep Medicine

## 2018-11-15 DIAGNOSIS — G4733 Obstructive sleep apnea (adult) (pediatric): Secondary | ICD-10-CM

## 2018-11-17 ENCOUNTER — Other Ambulatory Visit (HOSPITAL_BASED_OUTPATIENT_CLINIC_OR_DEPARTMENT_OTHER): Payer: Self-pay | Admitting: Sleep Medicine

## 2018-11-17 DIAGNOSIS — G4733 Obstructive sleep apnea (adult) (pediatric): Secondary | ICD-10-CM

## 2018-11-23 ENCOUNTER — Encounter (HOSPITAL_BASED_OUTPATIENT_CLINIC_OR_DEPARTMENT_OTHER): Payer: No Typology Code available for payment source | Admitting: Dermatology

## 2018-11-24 ENCOUNTER — Encounter (HOSPITAL_BASED_OUTPATIENT_CLINIC_OR_DEPARTMENT_OTHER): Payer: Self-pay | Admitting: Adult Health

## 2018-11-24 NOTE — Progress Notes (Signed)
NEW Rx for PAP sent to the following DME:[facility] ([facility]) [phone]

## 2018-11-25 ENCOUNTER — Encounter (HOSPITAL_BASED_OUTPATIENT_CLINIC_OR_DEPARTMENT_OTHER): Payer: Self-pay

## 2018-11-25 NOTE — Telephone Encounter (Signed)
DME:  [facility]  Device:  APAP  Pressure:  5-15  Plan:  Follow up with Antonietta Barcelona, ARNP,  6-7 weeks post set-up.    This patient, Sean Oliver, has been contacted via eCare/MyChart and given their sleep study results and the recommendations from their Sleep Medicine provider.     The patient has been given the [place] [facility] phone number of [phone] and encouraged to call with questions or concerns.    Wandra Arthurs, RN, 11/25/2018 11:00 AM  [place] [facility]

## 2018-12-16 ENCOUNTER — Other Ambulatory Visit (HOSPITAL_BASED_OUTPATIENT_CLINIC_OR_DEPARTMENT_OTHER): Payer: Self-pay | Admitting: Family Medicine

## 2018-12-16 DIAGNOSIS — N529 Male erectile dysfunction, unspecified: Secondary | ICD-10-CM

## 2018-12-20 MED ORDER — SILDENAFIL CITRATE 100 MG OR TABS
ORAL_TABLET | ORAL | 0 refills | Status: DC
Start: 2018-12-20 — End: 2019-06-06

## 2019-01-31 ENCOUNTER — Encounter (HOSPITAL_BASED_OUTPATIENT_CLINIC_OR_DEPARTMENT_OTHER): Payer: No Typology Code available for payment source | Admitting: Dermatology

## 2019-02-07 ENCOUNTER — Inpatient Hospital Stay: Admit: 2019-02-07 | Discharge: 2019-02-07 | Payer: MEDICAID

## 2019-02-07 DIAGNOSIS — R45851 Suicidal ideations: Secondary | ICD-10-CM

## 2019-02-07 LAB — HEPATIC FUNCTION PANEL
ALT: 13 U/L (ref 7–52)
AST: 20 U/L (ref 13–39)
Albumin: 2.4 g/dL — ABNORMAL LOW (ref 3.5–5.7)
Alkaline Phosphatase: 87 U/L (ref 36–125)
Bilirubin, Direct: 0.1 mg/dL (ref 0.0–0.4)
Bilirubin, Indirect: 0.5 mg/dL (ref 0.0–1.1)
Total Bilirubin: 0.6 mg/dL (ref 0.0–1.5)
Total Protein: 4.8 g/dL — ABNORMAL LOW (ref 6.4–8.9)

## 2019-02-07 LAB — BODY FLUID CULTURE PLUS STAIN
Culture Result: NO GROWTH
Gram Stain Result: NONE SEEN

## 2019-02-07 LAB — DIFFERENTIAL
Basophils Absolute: 71 /uL (ref 0–200)
Basophils Relative: 1 % (ref 0.0–1.0)
Eosinophils Absolute: 582 /uL (ref 15–500)
Eosinophils Relative: 8.2 % (ref 0.0–8.0)
Lymphocytes Absolute: 1186 /uL (ref 850–3900)
Lymphocytes Relative: 16.7 % (ref 15.0–45.0)
Monocytes Absolute: 817 /uL (ref 200–950)
Monocytes Relative: 11.5 % (ref 0.0–12.0)
Neutrophils Absolute: 4445 /uL (ref 1500–7800)
Neutrophils Relative: 62.6 % (ref 40.0–80.0)
nRBC: 0 /100 WBC (ref 0–0)

## 2019-02-07 LAB — POTASSIUM: Potassium: 3.4 mmol/L (ref 3.5–5.3)

## 2019-02-07 LAB — CBC
Hematocrit: 27 % (ref 38.5–50.0)
Hemoglobin: 9.4 g/dL (ref 13.2–17.1)
MCH: 34.2 pg (ref 27.0–33.0)
MCHC: 35 g/dL (ref 32.0–36.0)
MCV: 97.9 fL (ref 80.0–100.0)
MPV: 7.8 fL (ref 7.5–11.5)
Platelets: 259 10*3/uL (ref 140–400)
RBC: 2.75 10*6/uL (ref 4.20–5.80)
RDW: 16.5 % (ref 11.0–15.0)
WBC: 7.1 10*3/uL (ref 3.8–10.8)

## 2019-02-07 LAB — BASIC METABOLIC PANEL
Anion Gap: 9 mmol/L (ref 3–16)
BUN: 7 mg/dL (ref 7–25)
CO2: 27 mmol/L (ref 21–33)
Calcium: 7.9 mg/dL (ref 8.6–10.3)
Chloride: 94 mmol/L (ref 98–110)
Creatinine: 0.81 mg/dL (ref 0.60–1.30)
Glucose: 141 mg/dL (ref 70–100)
Osmolality, Calculated: 270 mOsm/kg (ref 278–305)
Potassium: 2.9 mmol/L (ref 3.5–5.3)
Sodium: 130 mmol/L (ref 133–146)
eGFR AA CKD-EPI: >90 See note.
eGFR NONAA CKD-EPI: >90 See note.

## 2019-02-07 LAB — BODY FLUID CELL COUNT
Lymphocytes %, Fluid: 49 %
Macrophage %, Fluid: 34 %
Mesothelial %, Fluid: 10 %
Neutrophil %, Fluid: 7 %
RBC, Fluid: 37 /uL
Total Nucleated Cells, Fluid: 159 /uL

## 2019-02-07 LAB — TROPONIN I: Troponin I: <0.04 ng/mL (ref 0.00–0.03)

## 2019-02-07 LAB — BLOOD CULTURE-PERIPHERAL
Culture Result: NO GROWTH
Culture Result: NO GROWTH

## 2019-02-07 LAB — ACETAMINOPHEN LEVEL: Acetaminophen Level: <10 ug/mL — ABNORMAL LOW (ref 10–30)

## 2019-02-07 LAB — PROTIME-INR
INR: 1 (ref 0.9–1.1)
Protime: 13.4 seconds (ref 12.1–15.1)

## 2019-02-07 LAB — MAGNESIUM: Magnesium: 1.5 mg/dL (ref 1.5–2.5)

## 2019-02-07 LAB — ETHANOL, SERUM: Ethanol: <10 mg/dL (ref 0–10)

## 2019-02-07 LAB — PROTEIN, BODY FLUID: Protein, Fluid: <3 g/dL

## 2019-02-07 LAB — SALICYLATE LEVEL: Salicylate Lvl: <3 mg/dL — ABNORMAL LOW (ref 10–30)

## 2019-02-07 LAB — LACTIC ACID: Lactate: 2.4 mmol/L — ABNORMAL HIGH (ref 0.5–2.2)

## 2019-02-07 LAB — LIPASE: Lipase: 58 U/L (ref 4–82)

## 2019-02-07 LAB — AMYLASE, BODY FLUID: Amylase, Fluid: 13 U/L

## 2019-02-07 LAB — AMMONIA: Ammonia: 87 ug/dL (ref 27–90)

## 2019-02-07 LAB — B NATRIURETIC PEPTIDE: BNP: 189 pg/mL (ref 0–100)

## 2019-02-07 MED ORDER — lidocaine-EPINEPHrine 1 %-1:100,000 injection 1 mL
1 | Freq: Once | INTRAMUSCULAR | Status: AC
Start: 2019-02-07 — End: 2019-02-07
  Administered 2019-02-07: 10:00:00 1 mL via INTRADERMAL

## 2019-02-07 MED ORDER — diphenhydrAMINE (BENADRYL) injection 25 mg
50 | Freq: Once | INTRAMUSCULAR | Status: AC
Start: 2019-02-07 — End: 2019-02-07
  Administered 2019-02-07: 07:00:00 25 mg via INTRAVENOUS

## 2019-02-07 MED ORDER — magnesium sulfate in D5W 100 mL 1 gram/100 mL IVPB 1 g
1 | INTRAVENOUS | Status: AC
Start: 2019-02-07 — End: 2019-02-07

## 2019-02-07 MED ORDER — magnesium sulfate in D5W 100 mL 1 gram/100 mL IVPB 1 g
1 | INTRAVENOUS | Status: AC
Start: 2019-02-07 — End: 2019-02-07
  Administered 2019-02-07: 13:00:00 1 g via INTRAVENOUS

## 2019-02-07 MED ORDER — potassium chloride (KCl)/Sterile water 100 mL 10 mEq/100 mL IVPB 10 mEq
10 | INTRAVENOUS | Status: AC
Start: 2019-02-07 — End: 2019-02-07
  Administered 2019-02-07: 10:00:00 10 meq via INTRAVENOUS

## 2019-02-07 MED ORDER — nicotine (NICODERM CQ) 14 mg/24 hr 1 patch
14 | Freq: Every day | TRANSDERMAL | Status: AC
Start: 2019-02-07 — End: 2019-02-07
  Administered 2019-02-07: 06:00:00 1 via TRANSDERMAL

## 2019-02-07 MED ORDER — ziprasidone (GEODON) in sterile water IM
20 | Freq: Once | INTRAMUSCULAR | Status: AC
Start: 2019-02-07 — End: 2019-02-07

## 2019-02-07 MED ORDER — lactated Ringers 1,000 mL IV fluid
Freq: Once | INTRAVENOUS | Status: AC
Start: 2019-02-07 — End: 2019-02-07
  Administered 2019-02-07: 11:00:00 1000 mL via INTRAVENOUS

## 2019-02-07 MED ORDER — cefTRIAXoneROCEPHIN2ginsodiumchloride09100mLADDaptorIVPB
Freq: Once | INTRAVENOUS | Status: AC
Start: 2019-02-07 — End: 2019-02-07
  Administered 2019-02-07: 09:00:00 2 g via INTRAVENOUS

## 2019-02-07 MED ORDER — sodium chloride 0.9 % 500 mL IV fluid
Freq: Once | INTRAVENOUS | Status: AC
Start: 2019-02-07 — End: 2019-02-07
  Administered 2019-02-07: 10:00:00 500 mL via INTRAVENOUS

## 2019-02-07 MED ORDER — potassium chloride (KLOR-CON M20) CR tablet 60 mEq
20 | Freq: Once | ORAL | Status: AC
Start: 2019-02-07 — End: 2019-02-07
  Administered 2019-02-07: 13:00:00 60 meq via ORAL

## 2019-02-07 MED ORDER — haloperidol lactate (HALDOL) injection 5 mg
5 | Freq: Once | INTRAMUSCULAR | Status: AC
Start: 2019-02-07 — End: 2019-02-07
  Administered 2019-02-07: 06:00:00 5 mg via INTRAVENOUS

## 2019-02-07 MED FILL — XYLOCAINE WITH EPINEPHRINE 1 %-1:100,000 INJECTION SOLUTION: 1 1 %-1:100,000 | INTRAMUSCULAR | Qty: 20

## 2019-02-07 MED FILL — GEODON 20 MG/ML (FINAL CONCENTRATION) INTRAMUSCULAR SOLUTION: 20 20 mg/mL (final conc.) | INTRAMUSCULAR | Qty: 20

## 2019-02-07 MED FILL — CEFTRIAXONE 2 GRAM SOLUTION FOR INJECTION: 2 2 gram | INTRAMUSCULAR | Qty: 1

## 2019-02-07 MED FILL — NICOTINE 14 MG/24 HR DAILY TRANSDERMAL PATCH: 14 14 mg/24 hr | TRANSDERMAL | Qty: 1

## 2019-02-07 MED FILL — HALOPERIDOL LACTATE 5 MG/ML INJECTION SOLUTION: 5 5 mg/mL | INTRAMUSCULAR | Qty: 1

## 2019-02-07 MED FILL — SODIUM CHLORIDE 0.9 % INTRAVENOUS SOLUTION: 500.00 500.00 mL | INTRAVENOUS | Qty: 500

## 2019-02-07 MED FILL — MAGNESIUM SULFATE 1 GRAM/100 ML IN DEXTROSE 5 % INTRAVENOUS PIGGYBACK: 1 1 gram/100 mL | INTRAVENOUS | Qty: 100

## 2019-02-07 MED FILL — POTASSIUM CHLORIDE 10 MEQ/100ML IN STERILE WATER INTRAVENOUS PIGGYBACK: 10 10 mEq/100 mL | INTRAVENOUS | Qty: 100

## 2019-02-07 MED FILL — DIPHENHYDRAMINE 50 MG/ML INJECTION SOLUTION: 50 50 mg/mL | INTRAMUSCULAR | Qty: 1

## 2019-02-07 MED FILL — LACTATED RINGERS INTRAVENOUS SOLUTION: 1000.00 1000.00 mL | INTRAVENOUS | Qty: 1000

## 2019-02-07 MED FILL — KLOR-CON M20 MEQ TABLET,EXTENDED RELEASE: 20 20 mEq | ORAL | Qty: 3

## 2019-02-07 NOTE — ED Notes (Signed)
Report and transfer of care given to [facility] at [facility]. Plan of care and admission discussed

## 2019-02-07 NOTE — Unmapped (Signed)
Patient is resting comfortably.

## 2019-02-07 NOTE — ED Notes (Addendum)
Pt refusing ECG. Pt states, I am allergic to the sticky stuff on those and I will not let you touch me. physician notified.

## 2019-02-07 NOTE — ED Notes (Signed)
Triage limited, pt refusing to answer any questions.

## 2019-02-07 NOTE — ED Triage Notes (Signed)
Pt brought to ED by private vehicle driven by god-daughter pt states that all I need to do is get on the other side of a mountain and eat a bullet, pt states that he has thought of killing others every god-damn day, including myself. Pt also has hx of liver issues.
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
## 2019-02-07 NOTE — Progress Notes (Signed)
Dr Cottie Banda has accepted the patient for admission and transfer to [facility]. [facility] to arrange transportation. Nurse, attending and patient all made aware. Nurse to call report with ETA to [phone] (ask for the [facility]).    No other SW needs at this time    Rosanna Randy MSW, [place]  [phone]

## 2019-02-07 NOTE — ED Notes (Signed)
Pt advised that he wanted to leave to the sitter.  Reviewed with the pt that his BP was still low and that we needed to get the K+ in and this may burn. He suggested that I start another IV on him in his R AC as this does not hurt when he has had K+ that way.  Pt agrees to eat breakfast and get the fluids in prior to starting IV.

## 2019-02-07 NOTE — ED Notes (Signed)
Pt states that he was bad to the staff on night shift because he was out of his mind.  Pt is cooperative and willing to take PO meds and IV Mg+.  Pt eating breakfast.

## 2019-02-07 NOTE — ED Notes (Signed)
MD advised of lo systolic  Meet and greet completed utilizing IMPACT  tool. Plan of care and assessment discussed with pt. Reviewed importance of IVF due to his low BP he stated his BP runs lo all the time. I noted his BP was in the 70's. He agreed to IVF as long as they did not burn.

## 2019-02-07 NOTE — ED Notes (Signed)
 Patient updated on plan of care and informed of any delays.  Call light is in reach and bed rails up for safety.  No concerns voiced at this time. The patient was advised to use call light if any concerns or questions.

## 2019-02-07 NOTE — ED Notes (Signed)
Upon completion of IV Mg infusion noted pts blankets to be wet  Further inspection of IV tubing proved to have a hole in it.  Advised Dr Jobie Quaker and agreed to suspend Mg and further use of the IV in the pts L hand  Pt remains asleep

## 2019-02-07 NOTE — ED Provider Notes (Signed)
  Emergency Department Reassessment Note    Marc Valdez is a 58 y.o. male patient who presented to the emergency department. This patient was initially seen by an off-going provider. Please see that provider's note for details regarding the initial history, physical exam and ED course.    Care of this patient was signed out to me. At the time of turnover the following steps in the patient's evaluation were pending:   - follow-up paracentesis labs  - If no SBP, PSW eval for placement    Ascitic fluid without evidence to suggest SBP.  Gram stain without organisms.  Patient did have some transient hypotension although he reports that his blood pressure is always low.  Blood pressure seemed to respond to IV fluids, and only dips low when he is sleeping.  Preliminary results on blood cultures show no growth to date.  He otherwise remained awake and alert and well-appearing.  He did become more cooperative.  Patient was given oral potassium repletion as he refused further IV repletion due to burning.  Repeat potassium is now 3.4.  [facility] was wanting to know a repeat potassium before they were went to accept him.  We are awaiting their final approval.  If they're willing to accept the patient he will be transferred there for psychiatric evaluation of his suicidal ideations.      Clinical Impression:  1. Suicidal ideation         At this time the patient has been medically cleared for transfer to psychiatric emergency services.      Antonietta Jewel, MD  02/07/2019 @ 1:50 PM

## 2019-02-07 NOTE — ED Provider Notes (Signed)
This patient was signed out to me pending psychiatric facility acceptance.  The patient is being transferred to the [facility] treatment facility.  The patient was informed of this and he is agreeable.

## 2019-02-07 NOTE — ED Notes (Signed)
Writer started potassium ordered with fluids to run in with, when writer started pump, pt started screaming that, it is burning so bad, take it off, slow it down, do something. Writer slowed k+ on Plum pump. Pt still screaming to turn it off. Writer disconnected k+ and ran fluids in solo, but pt continued to scream. Physician aware and agreed to turn all fluids off and let the pt rest for awhile. Pt is currently resting, no signs of infiltration at IV site. Will continue to monitor.

## 2019-02-07 NOTE — ED Provider Notes (Signed)
[place] ED Note    Date of Service: 02/07/2019    Reason for Visit: Psychiatric Evaluation and Suicidal      Patient History     HPI:      Marc Valdez is a 58 y.o. male with unknown past history presents to the [facility] department with reports of suicidality.    Patient presents aggravated and cursing at staff.  He is dropped off by hisgoddaughter due to repeated threats of planning to commit suicide via gunshot wound to the head.  The patient admits that he has over 15 fucking guns and back staff to let him just put a bullet in my head.  He is reluctant to go to his past history and repeatedly demands that his  stomach be pumped for pain relief.  Believe he is trying to describe a paracentesis as he points to his rather distended abdomen.  He states that he has been at [facility] multiple times the last week where they refused to do a paracentesis.  He states that is now on some much pain that is only option was to kill himself.  The patient states that he took multiple additional dosage of his pain medications today and attempt to end his life.  He refuses to answer further questioning but does deny fevers.         No past medical history on file.    No past surgical history on file.    Marc Valdez  has no history on file for tobacco, alcohol, and drug.    Patient's Medications    No medications on file       Allergies:   Allergies as of 02/07/2019    (No Known Allergies)         FH/SH: Nursing notes reviewed       Review of Systems     Limited due to patient agitation    Physical Exam     ED Triage Vitals [02/07/19 0125]   Vital Signs Group      Temp 98.5 F (36.9 C)      Temp Source Oral      Heart Rate 81      Heart Rate Source Monitor      Resp 18      SpO2 100 %      BP 120/79      MAP (mmHg) 91      BP Location Right arm      BP Method Automatic      Patient Position Sitting   SpO2 100 %   O2 Device None (Room air)       Physical  Exam    General:  Agitated adult male, cursing with staff  HEENT:  Normocephalic, atraumatic, PERRL, extra-ocular movements intact, oropharynx benign  Neck:  Supple, no lymphadenopathy, trachea midline, no masses  Pulmonary:   No increased WOB or accessory muscle use  Cardiac:  Regular rate and rhythm, no M/R/G  Abdomen:  Distended, protuberant abdomen  Musculoskeletal:  2+ pulses,  Skin:  No concerning rashes or lesions or signs of jaundice on exposed body surfaces  Neuro: Alert and oriented X 4, able to move all four extremities with equal strength bilaterally, sensory exam grossly intact, cranial nerves II-XII intact  Psych:  Expresses persistent suicidality with specific plans for self-harm via firearm      Diagnostic Studies     Labs:  Labs reviewed, please see Epic for full details.    Radiology:  No orders  to display       EKG:  02/07/2019  EKG performed in the setting of possible ingestion shows a sinus rhythm with ventricular rate 96 bpm.  Normal PR interval, QRS duration.  Prolonged QTc to 540.  No ST segment changes concerning for acute ischemic process.  No previous EKG available for direct comparison.  Overall impression is normal sinus rhythm with prolonged QT.    Emergency Department Procedures     Paracentesis  Date/Time: 02/07/2019 5:50 AM  Performed by: Ria Bush, MD  Authorized by: Ria Bush, MD   Consent: Verbal consent obtained.  Risks and benefits: risks, benefits and alternatives were discussed  Consent given by: patient  Required items: required blood products, implants, devices, and special equipment available  Patient identity confirmed: verbally with patient  Time out: Immediately prior to procedure a time out was called to verify the correct patient, procedure, equipment, support staff and site/side marked as required.  Initial or subsequent exam: initial  Procedure purpose: diagnostic  Indications: abdominal discomfort secondary to ascites and suspected  peritonitis  Anesthesia: local infiltration    Anesthesia:  Local Anesthetic: lidocaine 1% with epinephrine  Anesthetic total: 5 mL    Sedation:  Patient sedated: no    Preparation: Patient was prepped and draped in the usual sterile fashion.  Needle gauge: 22  Ultrasound guidance: no  Puncture site: right lower quadrant  Fluid removed: 3000(ml)  Fluid appearance: serous and bilious  Dressing: 4x4 sterile gauze  Patient tolerance: Patient tolerated the procedure well with no immediate complications            ED Course and MDM     Marc Valdez is a 58 y.o. male with a history and presentation as described above in HPI.      Patient presents to the emergency department agitated, confronting staff with verbal abuse and reporting a desire to kill himself as well as many firearms.  He appears to be frustrated with the care systems 2nd due to his persistent ascites which I assume is secondary to liver disease however the patient refuses to provide any previous medical information.  He initially denies any recent fevers or abdominal pain though later does endorse intermittent episodes of abdominal pain.  Psychiatric hold was signed by myself due to his specific intention for self-harm.  As the patient is unable to cooperate with a history and exam a broad potential tox workup was initiated.  Given the abdominal pain and ascites I we will plan for a diagnostic paracentesis as well.    Patient's lab work is overall reassuring including a negative Tylenol and salicylate levels.  The patient is hypokalemic for which she will receive IV supplementation.  Additionally he has a magnesium below 2, thus he'll have this corrected to assist with his potassium derangements.  His hepatic panel and lipase are overall reassuring.  Patient does have an elevated lactic acid which I believe this secondary to poor clearance by decreased liver function.  Ammonia and INR without acute concerns.  No leukocytosis.    Diagnostic paracentesis  was performed with labs sent and pending at time of note writing.  Patient is provided with Rocephin prior to procedure. I did provide additional therapeutic drainage due to his discomfort and missing multiple weeks of his scheduled therapeutic paracenteses through the [facility] health system.  I limited the total volume to approximately 3 L to decreased need for any albumin supplementation.  I will be going off service at this  point in signing out care of the patient to my colleague Dr. Jobie Quaker.  Her responsibilities will include to follow up paracentesis labs, reassess the patient and have social worker evaluate for possible placement if indicated for suicidality.            Patient was treated with below medications:  Medications   ziprasidone (GEODON) in sterile water IM (has no administration in time range)   nicotine (NICODERM CQ) 14 mg/24 hr 1 patch (1 patch Transdermal Patch Applied 02/07/19 0226)   potassium chloride (KCl)/Sterile water 100 mL 10 mEq/100 mL IVPB 10 mEq (has no administration in time range)   magnesium sulfate in D5W 100 mL 1 gram/100 mL IVPB 1 g (has no administration in time range)   lidocaine-EPINEPHrine 1 %-1:100,000 injection 1 mL (has no administration in time range)   haloperidol lactate (HALDOL) injection 5 mg (5 mg Intravenous Given 02/07/19 0226)   diphenhydrAMINE (BENADRYL) injection 25 mg (25 mg Intravenous Given 02/07/19 0238)   cefTRIAXone (ROCEPHIN) 2 g in sodium chloride 0.9% 100 mL ADDaptor IVPB (2 g Intravenous New Bag 02/07/19 0507)       Impression     Suicidality, ascites, abdominal distention and discomfort      No future appointments.      Ria Bush, MD, MPH  [facility]     Ria Bush, MD  02/07/19 [phone]

## 2019-02-07 NOTE — Unmapped (Signed)
IV pump beeping occluded. Pt had tied his IV tubing in a knot because he stated it was going to fast. Untied IV tubing Mg infusion continues.  Reminded pt to not tie his IV tubing in a knot pt agreed to comply.  Patient was placed on continuous cardiac monitor, continuous pulse oximetry monitor, and automatic cycling non invasive blood pressure monitoring every 30 minutes. Call light given and patient instructed to call with concerns or needs.

## 2019-02-07 NOTE — ED Notes (Signed)
Pt remains asleep.  Notified Dr Paula Libra of BP trends.Marland Kitchen

## 2019-02-07 NOTE — ED Notes (Signed)
Call to lab for K+ draw.  Call to [facility] for lunch w/ safety tray  Pt awake and cooperative at this time

## 2019-02-13 NOTE — Unmapped (Signed)
[facility] ED Post-Discharge Culture Follow-up:  Culture/Lab: blood - peripheral #2  Final: No growth after 5 days  No further follow up.    Willa Frater, PharmD, [place], [place]  [phone]

## 2019-02-13 NOTE — Unmapped (Signed)
[facility] ED Post-Discharge Culture Follow-up:  Culture/Lab: blood - peripheral #1  Final: NGTD x 5 days  No further follow up.    Samuella Rasool, PharmD, BCCCP, BCPS  [phone]

## 2019-02-13 NOTE — Unmapped (Signed)
[facility] ED Post-Discharge Culture Follow-up:  Culture/Lab: peritoneal fluid  Final: No Growth After 5 Days   No further follow up.    Willa Frater, PharmD, [place], [place]  [phone]

## 2019-02-24 ENCOUNTER — Telehealth (HOSPITAL_BASED_OUTPATIENT_CLINIC_OR_DEPARTMENT_OTHER): Payer: Self-pay | Admitting: Nephrology

## 2019-02-24 NOTE — Telephone Encounter (Signed)
Hello Rossie,     You are due for a follow up appointment with Dr Wynell Balloon. Please call [phone] to get scheduled.     Thank you,   [facility]

## 2019-03-07 ENCOUNTER — Encounter (HOSPITAL_BASED_OUTPATIENT_CLINIC_OR_DEPARTMENT_OTHER): Payer: No Typology Code available for payment source | Admitting: Nephrology

## 2019-03-07 ENCOUNTER — Ambulatory Visit: Payer: No Typology Code available for payment source | Attending: Nephrology | Admitting: Nephrology

## 2019-03-07 VITALS — BP 139/90 | HR 62 | Temp 97.3°F | Ht 72.01 in | Wt 228.4 lb

## 2019-03-07 DIAGNOSIS — I10 Essential (primary) hypertension: Secondary | ICD-10-CM | POA: Insufficient documentation

## 2019-03-07 DIAGNOSIS — G4733 Obstructive sleep apnea (adult) (pediatric): Secondary | ICD-10-CM | POA: Insufficient documentation

## 2019-03-07 DIAGNOSIS — N183 Chronic kidney disease, stage 3 unspecified: Secondary | ICD-10-CM

## 2019-03-07 DIAGNOSIS — R809 Proteinuria, unspecified: Secondary | ICD-10-CM | POA: Insufficient documentation

## 2019-03-07 DIAGNOSIS — E1129 Type 2 diabetes mellitus with other diabetic kidney complication: Secondary | ICD-10-CM

## 2019-03-07 LAB — RENAL FUNCTION PANEL
Albumin: 4.1 g/dL (ref 3.5–5.2)
Anion Gap: 7 (ref 4–12)
Calcium: 9.2 mg/dL (ref 8.9–10.2)
Carbon Dioxide, Total: 28 meq/L (ref 22–32)
Chloride: 108 meq/L (ref 98–108)
Creatinine: 1.62 mg/dL — ABNORMAL HIGH (ref 0.51–1.18)
Glucose: 136 mg/dL — ABNORMAL HIGH (ref 62–125)
Phosphate: 2.9 mg/dL (ref 2.5–4.5)
Potassium: 4 meq/L (ref 3.6–5.2)
Sodium: 143 meq/L (ref 135–145)
Urea Nitrogen: 23 mg/dL — ABNORMAL HIGH (ref 8–21)
eGFR by CKD-EPI: 47 mL/min/{1.73_m2} — ABNORMAL LOW (ref >59)

## 2019-03-07 LAB — URINALYSIS COMPLETE, URN
Bacteria, URN: NONE SEEN
Bilirubin (Qual), URN: NEGATIVE
Epith Cells_Renal/Trans,URN: NEGATIVE /HPF
Epith Cells_Squamous, URN: NEGATIVE /LPF
Glucose Qual, URN: NEGATIVE mg/dL
Ketones, URN: NEGATIVE mg/dL
Leukocyte Esterase, URN: NEGATIVE
Nitrite, URN: NEGATIVE
Occult Blood, URN: NEGATIVE
RBC, URN: NEGATIVE /HPF
Specific Gravity, URN: 1.025 g/mL (ref 1.006–1.027)
WBC, URN: NEGATIVE /HPF

## 2019-03-07 LAB — CBC (HEMOGRAM)
Hematocrit: 45 % (ref 38–50)
Hemoglobin: 14.4 g/dL (ref 13.0–18.0)
MCH: 28.1 pg (ref 27.3–33.6)
MCHC: 32.1 g/dL — ABNORMAL LOW (ref 32.2–36.5)
MCV: 87 fL (ref 81–98)
Platelet Count: 277 10*3/uL (ref 150–400)
RBC: 5.13 10*6/uL (ref 4.40–5.60)
RDW-CV: 14.8 % — ABNORMAL HIGH (ref 11.6–14.4)
WBC: 6.35 10*3/uL (ref 4.3–10.0)

## 2019-03-07 LAB — PROTEIN/CREATININE RATIO, TIMED URINE
Creatinine/24H, Urine: UNDETERMINED mg/(24.h) (ref 1000–2000)
Creatinine/Unit, Urine: 262 mg/dL
Protein (Total), Urine: 195 mg/dL
Protein/Creatinine Ratio: 0.7 — ABNORMAL HIGH (ref <0.2)
Total Protein/24H, Urine: UNDETERMINED g/d (ref 0.05–0.08)

## 2019-03-07 LAB — PARATHYROID HORMONE: Parathyroid Hormone: 87 pg/mL (ref 12–88)

## 2019-03-07 NOTE — Progress Notes (Signed)
NEPHROLOGY CLINIC FOLLOW UP NOTE  03/07/2019            REFERRING PHYSICIAN:   Morhaf Al Oliver     INITIAL CONSULT DATE: 05/28/2017      Past Medical History:   Diagnosis Date   . Cancer ([place])    . Diabetes mellitus ([place])    . Hypertension    . Kidney disease    . Lipidemia    . Lung disease    . Type II or unspecified type diabetes mellitus without mention of complication, not stated as uncontrolled      Past Surgical History:   Procedure Laterality Date   . left eye surgery x 5     . RPR INGUN HERNIA SLIDING ANY AGE      many years ago.  bilateral.        HISTORY OF PRESENT ILLNESS: Sean Oliver is a 58 year old male with past medical history significant for the above mentioned problems.    CC: Follow up on CKD and Hypertension     Based on verbal report, past medical history is significant for:    Diabetes mellitus of at least 15 years duration, though has been very well controlled over this period of time.  He was initially started off on insulin but subsequently has been switched to metformin only with good glycemic control.  By his report, he does not know of any end organ damage including absence of any diabetic retinopathy or neuropathy.  Also has long-standing hypertension of at least 20 years duration.  While he has not checked his pressures regularly at home, he reports that for the most part at doctors visits he was told that the blood pressure is only marginally elevated.  Prolonged time he was on combination lisinopril with hydrochlorothiazide, that was changed subsequently to alternative agents.  One reasoning for stopping lisinopril was to determine if it would lead to an improvement in creatinine/GFR.  However, over the last few months patient has been experiencing uncontrolled hypertension with most readings in the 160s-170s/90s-100 range.  He is not specifically expressing any symptoms related to these uncontrolled readings and denies any history of TIAs/CVAs.  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  Also had considerable issues with diverticulitis leading to laparotomy with limited resection as well in the past.    During the recent follow-ups with primary care physician and in effort to undertake a diagnostic evaluation for his chronic kidney disease, both urinary and imaging studies were undertaken.  Urinalyses now revealing increased albuminuria while imaging studies including ultrasound, CT scan and MRI also indicating around 2 cm left-sided interpolar mass most compatible with renal cell carcinoma.  He has not experienced any specific flank pains, dysuria, renal colic or hematuria.  Similarly no major change in urinary habit and no problems with dependent edema.    No history of nephrolithiasis, recurrent urinary tract infections or pyelonephritis.  Only occasional use of NSAIDs with no over-the-counter/herbal supplement use.    Interval History:  No acute illnesses, complications or hospitalizations in the interim.  Overall, he has fared well with no acute issues.  Seems like hypertension has progressively gotten better controlled since last blood pressure medication changes including switching amlodipine with nifedipine. However, there is some confusion regarding the exact dose of metoprolol that he is taking.  Previously encountered nocturia has dissipated now that he is using CPAP regularly.   Otherwise, he denies  any hesitancy, urgency, postvoid dribbling or weak urinary stream and has not been enthusiastic about the idea of starting tamsulosin trial.     Also is continuing to follow-up with urology for left renal lesion with regards to suspicion for renal cell carcinoma.  He has undergone a MRI 02/2018 (prior US & CT scan in 2019 as well) and has been encouraged to continue active surveillance as the lesion remains unchanged in size upon follow-up.    He is already cognizant of following a low-salt diet and is  trying to stay away from red meats.  He has not experienced any dyspnea, chest pain, visual problems or dependent edema.      Otherwise, patient denies any acute problems and a directed review of systems was essentially unremarkable.          ALLERGIES:    Review of patient's allergies indicates:  No Known Allergies    HOME MEDICATIONS:      Current Outpatient Medications:   .  albuterol HFA 108 (90 Base) MCG/ACT inhaler, Inhale 2 puffs by mouth every 4 hours as needed for shortness of breath/wheezing., Disp: 1 Inhaler, Rfl: 11  .  atorvastatin 40 MG tablet, Take 1 tablet (40 mg) by mouth daily., Disp: 30 tablet, Rfl: 11  .  fluticasone propionate 50 MCG/ACT nasal spray, Spray 2 sprays into each nostril daily., Disp: 16 g, Rfl: 6  .  lisinopril 10 MG tablet, Take 1 tablet (10 mg) by mouth every 12 hours. DOSE INCREASE, Disp: 180 tablet, Rfl: 6  .  metFORMIN 1000 MG tablet, Take 1 tablet (1,000 mg) by mouth 2 times a day. (Patient taking differently: Take 1,000 mg by mouth daily. ), Disp: 60 tablet, Rfl: 6  .  metoprolol succinate ER 25 MG 24 hr tablet, Take 2 tablets (50 mg) by mouth daily. Do not chew or crush., Disp: 180 tablet, Rfl: 6  .  NIFEdipine ER 30 MG tablet extended-release 24 hour, Take 1 tablet (30 mg) by mouth 2 times a day., Disp: 180 tablet, Rfl: 6  .  sildenafil 100 MG tablet, Take 1 tablet by mouth daily as needed for erectile dysfunction. Don't take with other blood pressure meds., Disp: 30 tablet, Rfl: 0        PHYSICAL EXAM:     Vitals Signs: BP (!) 139/90 Comment: post 5 mins  Pulse 62   Temp 97.3 F (36.3 C) (Temporal)   Ht 6' 0.01" (1.829 m)   Wt (!) 228 lb 6.3 oz (103.6 kg)   SpO2 97% Comment: RA  BMI 30.97 kg/m .    Constitutional: Sitting comfortably in chair, in no apparent distress.  Eyes:  Pupils normal size, EOM intact  Mouth/Throat: Mucous membranes are moist,  no oropharyngeal lesions  Neck: No JVD or carotid bruits  Lungs: No rhonchi or creptitus  Heart: S1, S2 with II/VI  ESM, no rubs/ gallops  Abdomen: Soft, nontender; bowel sounds normal  Extremities: No pretibial or pedal edema,    Neuro: Alert O X3, no focal deficiits  Skin: No rash        DIAGNOSTIC STUDIES:    Results for Sean Oliver, Sean Oliver (MRN [id number]) as of 03/07/2019 16:18   04/19/2017 11:01 07/05/2017 16:47 09/16/2017 11:03 08/02/2018 11:06 03/07/2019 14:46   Sodium 141 144 140 138 143   Potassium 3.8 3.9 3.7 3.9 4.0   Chloride 106 107 106 106 108   Carbon Dioxide, Total _0 Anion Gap 6 7  4 2 (L) 7   Glucose 102 117 238 (H) 159 (H) 136 (H)   Urea Nitrogen _0 (H)   Creatinine 1.61 (H) 1.51 (H) 1.49 (H) 1.54 (H) 1.62 (H)   GFR, Calc, African American 54 (L) 59 (L) 60 57 (L)    eGFR, Calculated     47 (L)   Calcium 9.2 9.6 9.4 9.0 9.2   Phosphate  3.5 2.8  2.9           MRI 03/23/2018:  1. Solid mass in the left kidney mid zone is unchanged in size or appearance compared to MRI 05/10/2017. No new lesions.     2. T2 hyperintense lesion in segment 6 of the liver is unchanged compared to MRI 05/10/2017, and remains indeterminate. Metastatic disease is less likely given its stability, though not excluded. If further characterization is   desired, suggest MRI Eovist to evaluate for focal nodular hyperplasia.         ASSESSMENT AND PLAN:    -Chronic Kidney Disease Stage 3  Review of available labs since at least 2015 show a creatinine ranging from 1.27-1.61 (and one value obtained through [place] from 04/28/1996 of 1.56m/dL).  This indicates long-standing element of chronic kidney disease with eGFR likely in the ~ 55 mL/minute range albeit a more or less stable course over this period of time with minimal progression.  Last serum creatinine at 1.62 with eGFR  471mminute, that indicates overall stable status without noticeable progression.  While current albuminuria is around 70076mtotal proteinuria ~1g), his chronic kidney disease appears principally from hypertensive nephrosclerosis (corroborated by  findings of LVH on ECHO as well), some parenchymal loss from cysts and possibly an element of diabetic nephropathy.  Ultrasound of the kidneys is instructive in this regard indicating increased echogenicity from interstitial fibrosis.  Previously, a monoclonal disorder was ruled out with urine and serum electrophoresis studies as well as free light chain assays.      With regards to need for kidney biopsy, if patient undergoes partial nephrectomy for presumptive renal cell carcinoma in his left kidney, will review the kidney tissue for any primary renal pathology.  Needs continued aggressive risk factor modification.      -Hypertension, Primary  Blood pressure control has improved since switching amlodipine to nifedipine XL 30 mg twice a day and continuing with lisinopril 10 mg BID. While he should be on metoprolol XL 65m25m, there is some confusion whether he is taking only 25mg61m is going to check on it and take the proper dose of 65mg.76m   -Diabetes mellitus type 2, controlled  Overall stable control of diabetes mellitus with last hemoglobin A1c of 6.4- 7.5% and self-reported absence of any diabetic retinopathy or neuropathy despite the long vintage.  Continue  lisinopril for better blood pressure control and for maximum anti-proteinuric effect.  Given his GFR, there is no contraindication for continued use of metformin.  Proteinuria remains ~1- 1.3g with albuminuria on last evaluation ~700 mg.  With a possible element of diabetic nephropathy, consideration could be given to initiating SGLT2i therapy.  In this regard, primary care physician can determine if additional hypoglycemic therapy is indicated for improved metabolic control.    -Renal cell carcinoma   Imaging studies showing a 2 cm lesion in the interpolar region of the left kidney, likely renal cell carcinoma.  Is being actively followed in urology clinic where pros and cons of conservative versus surgical therapy  were discussed.  On the basis of  previous ultrasound and CT imaging and recent MRI 02/2018 showing overall stable size without any evidence of progression, urology clinic condition is to continue to pursue active surveillance only.    Next visit with Urology in early October 2020.    -Nocturia  Advised him to cut out all fluids after dinner.  This has resolved since restarting CPAP regularly.           Johnathan Hausen, MD, 03/07/2019  Division of Nephrology  [facility]  [place] [facility] [place], [place]

## 2019-03-08 LAB — HEMOGLOBIN A1C, HPLC: Hemoglobin A1C: 6.8 % — ABNORMAL HIGH (ref 4.0–6.0)

## 2019-03-19 NOTE — Progress Notes (Signed)
UROLOGY CLINIC NOTE    Reason for Visit:  Left renal mass    History of Present Illness:  Sean Oliver is a 58 year old male who returns to me for follow up for a 1.9 cm left renal mass on active surveillance. His creatinine has been elevated to 1.5 on 05/10/2017.     Interval History:  Seen last on 08/2018. Renal mass was stable at that time, size is stable on MRI today. No issues since last visit. Doing well today. No gross hematuria, UTIs, fevers, or issues with diverticulitis. Recently received CPAP and is sleeping better now.         PMH: stage 3 CKD (Cr 1.54), HTN,  type II DM, diverticulitis s/p colectomy x2 (once with mesh). incisional hernia, OSA on CPAP     Review of Systems:  Const: Neg for fever, No chills  GU: As noted in the HPI above      Past Medical Hx:    Past Medical History:   Diagnosis Date   . Cancer ([place])    . Diabetes mellitus ([place])    . Hypertension    . Kidney disease    . Lipidemia    . Lung disease    . Type II or unspecified type diabetes mellitus without mention of complication, not stated as uncontrolled      Past Surgical Hx:    Past Surgical History:   Procedure Laterality Date   . left eye surgery x 5     . RPR INGUN HERNIA SLIDING ANY AGE      many years ago.  bilateral.     has a current medication list which includes the following prescription(s): albuterol hfa, atorvastatin, fluticasone propionate, lisinopril, metformin, metoprolol succinate er, nifedipine er, and sildenafil.    Allergies:  Review of patient's allergies indicates:  No Known Allergies    OBJECTIVE:  Physical Exam:  BP (!) 166/104   Pulse 60   Temp 97.6 F (36.4 C) (Temporal)   Ht 6' (1.829 m)   Wt (!) 228 lb (103.4 kg)   SpO2 100%   BMI 30.92 kg/m   General:  No apparent distress, well-nourished  Extremities:  No peripheral edema     I reviewed and discussed laboratory results with the patient today.   Laboratory review:    Results for orders placed or performed in visit on 08/02/18   Comprehensive  Metabolic Panel   Result Value Ref Range    Sodium 138 135 - 145 meq/L    Potassium 3.9 3.6 - 5.2 meq/L    Chloride 106 98 - 108 meq/L    Carbon Dioxide, Total 30 22 - 32 meq/L    Anion Gap 2 (L) 4 - 12    Glucose 159 (H) 62 - 125 mg/dL    Urea Nitrogen 16 8 - 21 mg/dL    Creatinine 1.54 (H) 0.51 - 1.18 mg/dL    Protein (Total) 6.9 6.0 - 8.2 g/dL    Albumin 4.2 3.5 - 5.2 g/dL    Bilirubin (Total) 0.5 0.2 - 1.3 mg/dL    Calcium 9.0 8.9 - 10.2 mg/dL    AST (GOT) 16 9 - 38 U/L    Alkaline Phosphatase (Total) 72 37 - 159 U/L    ALT (GPT) 18 10 - 48 U/L    GFR, Calc, European American 47 (L) >59 mL/min/[1.73_m2]    GFR, Calc, African American 57 (L) >59 mL/min/[1.73_m2]    GFR, Information  Calculated GFR in mL/min/1.73 m2 by MDRD equation.  Inaccurate with changing renal function.  See http://[email or web address]       I reviewed and discussed radiology results with the patient today.   Radiology review:    03/20/2019 MRI abdomen  Final read not available but images reviewed and left renal mass appears to be stable in size, around 1.8-1.9 cm.    03/22/2018 MRI Abdomen WO/W Con:  IMPRESSION:  1. Solid mass in the left kidney mid zone is unchanged in size or appearance   compared to MRI 05/10/2017. No new lesions.  2. T2 hyperintense lesion in segment 6 of the liver is unchanged compared to   MRI 05/10/2017, and remains indeterminate. Metastatic disease is less likely   given its stability, though not excluded    05/10/2017 MRI Abdomen WO/W Con:  1. Redemonstration of solid renal mass in the left interpolar kidney measuring  1.8 cm.    05/04/2017 CT Abs WO/W Con:  1. In the interpolar region of the left kidney, there is a 2.2 cm expansile   enhancing lesion with rapid washout, which is concerning for renal cell   carcinoma.     Discussion: We again discussed the nature of renal tumors. We reviewed that his Oct 2020 MRI demonstrated that his left renal mass is stable, unchanged in size.  Reviewed expected growth of renal masses is approximately 85mm per year. We discussed that the unchanged size and appearance of his renal mass over the past year is encouraging that, if this is a kidney cancer, it is unlikely to be aggressive.     We again discussed treatment options, which include active surveillance, excisional therapy in the form of partial or radical nephrectomy or ablative therapy in the form of cryoablation or radiofrequency ablation. Discussed that given the stability on imaging, I would not recommend active intervention at this time. He elects to remain on active surveillance at this time. Will repeat CT abdomen in one year given patient's mild claustrophobia. He would be a candidate for cryo or retro partial nephrectomy.    IMPRESSION/PLAN:   1) Left renal mass. Stable. Repeat CT abdomen in one year. Patient considering treatment of mass at this point (slightly favoring retro partial), but would like to discuss with family. Will reach out to office when he is ready to discuss again, would prefer televisit.     I, Dr. Anders Simmonds, MD, provided draft documentation of services performed by Dr. Jodi Mourning in my presence. To the best of my knowledge and ability, the statements included in the documentation accurately reflect the clinical procedure or services I observed.

## 2019-03-20 ENCOUNTER — Ambulatory Visit: Payer: No Typology Code available for payment source | Attending: Urology

## 2019-03-20 ENCOUNTER — Ambulatory Visit (INDEPENDENT_AMBULATORY_CARE_PROVIDER_SITE_OTHER): Payer: No Typology Code available for payment source | Admitting: Urology

## 2019-03-20 VITALS — BP 166/104 | HR 60 | Temp 97.6°F | Ht 72.0 in | Wt 228.0 lb

## 2019-03-20 DIAGNOSIS — C642 Malignant neoplasm of left kidney, except renal pelvis: Secondary | ICD-10-CM

## 2019-03-20 DIAGNOSIS — N2889 Other specified disorders of kidney and ureter: Secondary | ICD-10-CM

## 2019-03-20 LAB — CREATININE BY I_STAT (POC), ESC: Creatinine: 1.7 mg/dL — ABNORMAL HIGH (ref 0.51–1.18)

## 2019-03-20 NOTE — Progress Notes (Signed)
I, Dr. Jonathan Harper, saw and evaluated this patient in a combined visit with Dr. Peter Sunaryo, MD. I participated in all key parts of the visit including the medical decision making. I reviewed the documentation provided by Dr. Sunaryo, and I agree with the plan of care as outlined.

## 2019-03-28 ENCOUNTER — Ambulatory Visit: Payer: No Typology Code available for payment source | Attending: Dermatology | Admitting: Dermatology

## 2019-03-28 ENCOUNTER — Encounter (HOSPITAL_BASED_OUTPATIENT_CLINIC_OR_DEPARTMENT_OTHER): Payer: Self-pay | Admitting: Dermatology

## 2019-03-28 VITALS — Temp 97.9°F

## 2019-03-28 DIAGNOSIS — B351 Tinea unguium: Secondary | ICD-10-CM | POA: Insufficient documentation

## 2019-03-28 DIAGNOSIS — Z85828 Personal history of other malignant neoplasm of skin: Secondary | ICD-10-CM | POA: Insufficient documentation

## 2019-03-28 DIAGNOSIS — L299 Pruritus, unspecified: Secondary | ICD-10-CM

## 2019-03-28 NOTE — Progress Notes (Signed)
[facility] [place]  l  [address] [place]  [address]  TEL: [phone]  l  FAX: [phone]      03/28/2019    PRIMARY CARE PROVIDER:  Matthew Saras[place] [address]    PATIENT: Sean Oliver    [id number]    IDENTIFICATION/CHIEF CONCERN:    Sean Oliver is a 58 year old male return patient seen today for evaluation of a lesion on the left shoulder in the setting of a personal history of invasive SCC.     Chief Complaint   Patient presents with   . 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  - History of SCC   - No evidence of recurrence. No further treatment necessary.   - Toenail dystrophy   - discussed that some of these changes are likely due to chronic trauma. Let me know if any worsening    Since the last visit he notes an itchy area on his left shoulder. This process has been ongoing for about a year. He does note that couple of months ago there was a pruritic, red rash in the area. He is not currently trying any treatments and nothing is known to make it better or worse.     ROS:  Gen - Feels well, No fevers/chills/recent illnesses/unintended weight loss/night sweats  Skin - No other skin complaints    SOCIAL HISTORY:  Lives with wife and kids (triplets, born 2001). From [place]. History of sunburns on the scalp. He reports that he quit smoking about 3 years ago. His smoking use included cigars. He has never used smokeless tobacco. He reports current alcohol use. He reports that he does not use drugs.    PHYSICAL  EXAMINATION:  Vital Signs: Temperature 97.9 F (36.6 C), temperature source Temporal.  General: He is a well appearing adult male in no acute distress, who has a normal build. He is pleasant with an appropriate mood and affect without barriers to understanding.  Skin: exam performed included scalp, hair, face, neck, chest, abdomen, back, arms (right, left), forearms (right, left), thighs (right, left), legs (right, left), hands (right, left), feet (right, left), fingernails, toenails, buttocks. Patient declines genital exam. Exam was remarkable for:    L shoulder - some xerosis, no appreciable rash   Chest, abdomen - scattered stuck on papules c/w seborrheic keratoses   R great toenail, R 5th toenail - hyperkeratotic with subungual debris   L 1st, 2nd, 3rd, and 5th toenails - hyperkeratosis with subungual debris and yellowing   L leg - well healed scar no evidence of recurrent SCC     IMPRESSION/PLAN:  The following was discussed with the patient.  Recommendations are as follows:    1. Onychomycosis  The patient has hyperkeratosis and discoloration of several toenails on the bilateral feet. Discussed that these changes are likely due to onychomycosis. Reviewed OTC treatment options such as  antifungal topicals and oral antifungals. The patient is not bothered by this condition at this time.     2. Pruritus  Patient with itchy skin on his back, ongoing for about a year. He also reports a rash in the area a couple of months ago which has now resolved. We discussed his current pruritus may be related to dry skin and I recommended that he moisturize the area. We also discussed the possibility notalgia paresthetica. I recommended that he let me know if no improvement with emollient use or if his rash returns.   - Reinforced dry skin care. Recommended to use thicker emollients (ointments or creams rather than lotions). Instructions and recommendations were provided in the AVS.    - Recommended OTC Sarna lotion with  pramoxine. Information provided in AVS.     3. History of squamous cell carcinoma of skin  Well-healed scar(s) without nodularity or pigmentation.  No evidence of recurrence or concerning lesions based on examination today.   Recommend regular skin examinations, self-monitoring, and sun protection including sunscreen with reapplication and UPF clothing.  -  AVS:  Self-exams, ABCDE's of moles, sun protection, and sun screen recommendations provided today.     Return to clinic in 12 months for full skin exam or sooner PRN acute concerns.    03/28/2019 @ 10:46 AM - I, Cardell Peach, Medical Scribe acted as a Education administrator and documented the service/procedure performed to the best of my knowledge in the presence of Karlyne Greenspan, MD who will provide the final review and authentication.  Signed: Cardell Peach, Medical Scribe     I, Karlyne Greenspan, MD, personally performed the services described in this documentation, as scribed by Cardell Peach in my presence, and it is both accurate and complete.

## 2019-03-28 NOTE — Progress Notes (Signed)
Chief Complaint   Patient presents with   . Derm Problem     FSE       Would you like a full skin exam today? YES    Gown: YES      If our office needs to contact you after your visit today, is it ok to leave a detailed message on your phone or e-care ?  YES      What is the preferred method of contact e-care/ phone  number? [phone]

## 2019-03-28 NOTE — Patient Instructions (Addendum)
For itching You may consider using over the counter Sarna lotion. The version for sensitive skin contains pramoxine, which may provide a calming sensation. The stronger version has menthol and camphor. You can put this lotion in the fridge before applying it, which may enhance the cooling effect.     Let us know if your rash comes back - you can send a photo to [place].  ========================================================     Dry Skin Care  Helpful tips    What causes dry skin?  Dry skin can be caused by:  Marland Kitchen Low-humidity indoor heat  . Cold winter air  . Air conditioning  . Too much sun or wind  . Harsh soaps or detergents  . Natural aging    Tips  These tips will help keep your skin from drying out:    Bathing  . Bathe less often. Most people do not need to bathe every day. Even though water soaks into your skin during a bath or shower, the water, heat, and soap actually remove protective oils from your skin. This can cause dry, itchy skin later.  . Take shorter, cooler baths and showers. Do not take long baths or showers, and do not use very hot water.  . Use less soap. You do not have to use soap on all your skin when you bathe. Use soap only in "hairy areas" (under your arms and around your anus and genital area).  . Use mild soap. When you use soap, use Dove, [place], Basis, or Cetaphil bar soap. Do not use liquid soap. Hulan Fray is a good choice because it does not cost a lot, you can find it in many stores, and it is one of the mildest soaps you can buy.  Fraser Din instead of rubbing yourself dry. Right after bathing, pat your skin dry with a clean towel.  . Use a skin lubricant. Apply ointment, cream, or lotion to moisten your skin after you have bathed and patted your skin dry.    Using Skin Lubricants  Use only the amount of skin lubricant that you can easily rub into your skin. It should not take long for your skin to absorb it. There should not be a greasy residue afterward. If there is, use less next  time.  Everyone is different. What suits you may not work for someone else. The best skin lubricant is the one you like the best, because you are more likely to use it.  To find the best product for you:  . Buy several skin lubricants and compare them to each other. Use one on the left side of your body and a different one on the right side.  . After 2 or 3 weeks, decide which one you like best, and discard the other.  . Try the comparison again with a different product until you have found the one you are most happy with.    Types of Skin Lubricants  There are many types of skin lubricants to choose from. You can buy them at most drugstores or grocery stores.  Lotions  Lotions are the mildest treatment for dry skin. They are usually tried first. Some brands of lotion are:  . Cetaphil . CeraVe . Curel  . Neutrogena body emulsion . Pen-Kera  . DML . Keri . Lubriderm . Moisturel    Creams  Creams are a bit more greasy, but they are usually more effective than lotions for treating dry skin. Try these creams:  . Eucerin . Cetaphil .  CeraVe      Ointments  Ointments are even greasier and more effective than creams. They are easiest to apply right after a shower or bath, when your skin is still moist. You will need much less ointment when you put it on moist skin and your skin will be less greasy than if you put it on dry skin.  Two ointments to try are:  . Aquaphor . Vaseline petroleum jelly (the "ultimate" lubricant)    ========================================================       HOW TO DO A SELF SKIN EXAMINATION:  (adapted from: http://www.skin cancer.org/skin-cancer-information/early-detection/step-by-step-self-examination)    1. Examine your face, especially the nose, lips, mouth, and ears (both sides of your ears). Use one or both mirrors to get a clear view.  2. Thoroughly inspect your scalp, using a blow dryer and mirror to expose each section to view. Get a friend or family member to help, if you can.  3. Check  your hands carefully: palms and backs, between the fingers and under the fingernails. Continue up the wrists to examine both front and back of your forearms.  4. Standing in front of the full-length mirror, begin at the elbows and scan all sides of your upper arms. Don't forget the underarms.  5. Next focus on the neck, chest, and torso. Women should lift breasts to view the underside.  6. With your back to the full-length mirror, use the hand mirror to inspect the back of your neck, shoulders, upper back, and any part of the back of your upper arms you could not view in step 4.  7. Still using both mirrors, scan your lower back, buttocks, and backs of both legs.  8. Sit down; prop each leg in turn on the other stool or chair. Use the hand mirror to examine the genitals. Check front and sides of both legs, thigh to shin, ankles, tops of feet, between toes and under toenails. Examine soles of feet and heels.    Please be on the look out for any suspicious lesions.  These include growths or moles that are growing, changing or looking different than other growths or moles on the body. Also pay attention to any growths that itch, bleed, or don't heal. Consider taking digital photographs of any mole or growth that you want to monitor over time.  Call your provider if you have any questions or find a concerning growth or mole.    More information about skin cancer and what to look for can be found at:   http://www.myers.net/        CHANGES IN MOLES:  See your health care provider if your moles hurt, itch, ooze, bleed, thicken, or become crusty. Call your health care provider if your moles show signs of melanoma. These include a mole that has:     Asymmetry. The sides of the mole don't match   Border. The edges are ragged, notched, or blurred   Color. The color within the mole varies   Diameter. The mole is larger than 6 mm (size of a pencil eraser)   Evolving. The mole is getting larger or the  shape or color of the mole is changing    Adapted from:   2000-2015 [employer], [place] [address]. All rights reserved. This information is not intended as a substitute for professional medical care. Always follow your healthcare professional's instructions.          PREVENTING SKIN CANCER:  Relaxing in the sun  may feel good. But it isn't good for your skin. In fact, being exposed to the sun's harmful rays is a major cause of skin cancer.  People of all ages and backgrounds are at risk.     Your role in prevention  You can act today to help prevent skin cancer. Start by avoiding the sun's UV (ultraviolet) rays. And don't use tanning beds, which are no safer than the sun. Taking these steps can help keep you from getting skin cancer. It can also help prevent wrinkles and other sun-induced aging effects. Make sure your children also follow these safeguards. Now is the time to start taking preventive steps against skin cancer.    When you are outdoors  Protect your skin when you go outdoors during the day.    Wear tightly woven clothing that covers your skin. Put on a wide-brimmed hat to protect your face, ears, and scalp.   Watch the clock. Try to avoid the sun between 10 a.m. and 4 p.m., when it is strongest.   Head for the shade or create your own. Use an umbrella when sitting or strolling.   Know that the sun's rays can reflect off sand, water, and snow. This can harm your skin. Take extra care when you are near reflective surfaces.   Keep in mind that even when the weather is hazy or cloudy, your skin can be exposed to strong UV rays.   Shield your skin with sunscreen. Also, apply sunscreen to your children's skin.      Tips for using sunscreen  To help prevent skin cancer, choose the right sunscreen and use it correctly. Try the following tips:   Choose a sunscreen that has a sun protection factor (SPF) of at least 28. Also, choose a sunscreen labeled "broad  spectrum." This will shield you from both UVA and UVB (ultraviolet A and B) rays.   If one brand irritates your skin, try another   Use a water-resistant sunscreen if swimming or sweating.   Use at least an ounce of sunscreen (enough to fill a shot glass) to cover exposed areas. You might need to adjust the amount depending on your body size.   Apply the sunscreen to dry skin about 15 minutes before going outdoors to give it time to be absorbed.   Reapply sunscreen every2 hours. If you're active or in the water, do this more often.   Cover any sun-exposed skin, from your face to your feet. Don't forget your ears and your lips.   Know that while sunscreen helps protect you, it isn't enough. Sunscreens extend the length of time you can be outdoors before your skin begins to redden, but they don't give you total protection. Using sunscreen doesn't mean you can stay out in the sun indefinitely, since damage to the skin cells is still occurring. You should also wear protective clothing. And try to stay out of the sun as much as you can, especially from 10 a.m. to 4 p.m.    Adapted from:   2000-2015 [employer], [place] [address]. All rights reserved. This information is not intended as a substitute for professional medical care. Always follow your healthcare professional's instructions.    Web Sites to Learn More  . Skin Cancer Foundation:  www.skincancer.org/Sunscreens-Explained.html  . American Academy of Dermatology:  GenitalDoctor.no.html        SUNSCREEN RECOMMENDATIONS:  Rated 90 and above Consumer Reports 2018    Scented lotions:  -  Equate [employer]) Sport Lotion SPF50 (low price)  - BullF[place] Sport Quik Gel SPF50  - Coppertone Water Babies SPF 50 (fragrance free, oil free)  - Equate ([employer]) Ultra Protection Lotion SPF50 (low price)    Unscented lotion:  - La Roche-Posay Anthelios 60 melt in sunscreen milk lotion (does not  leave an oily sheen to skin)  - Coppertone UltraGuard SPF70 lotion (low odor or residue)    Sprays: (we recommend lotions instead of sprays)  - Trader Joe's Spray SPF 50+ (low price) (has a floral and citrus scent)  - Banana Boat SunComfort Continuous Spray SPF 50+ (has a slight coconut scent)  - [employer] SPF 70    Sticks: (none of the sticks were rated at 90 or above)  - Up & Up ([employer]) Kids Sunscreen Stick SPF 55 (Target, low price)  _______________________________________    Consumer Reports also had these additional category recommendations in 2016:    Facial lotions:  - Avon Sun+ sunscreen face lotion SPF40  - [employer] Up & Up Ultra Sheer SPF30 (also is fragrance free)    Oxybenzone Free:  - Ocean Potion Protect & Nourish SPF 30 (<$10 for 8 oz)     Zinc Oxide/Titanium Dioxide only:  - Cotz plus SPF 58 (<$25 for 2.5 oz)  - [employer] Sensitive SPF30+ (~$15 for 2.9 oz)    ========================================================     Sunscreen recommendations:    Elta MD UV clear broad spectrum  La Roche Posay Anthelios Clear Skin Sunscreen  No AD Sport SPF 50  Paula's choice clear Ultra Lite daily hydrating fluid SPF 30+  Neutrogena SPF 20 in medium to deep tint  IT cosmetics CC+ cream with SPF 50+

## 2019-04-02 ENCOUNTER — Other Ambulatory Visit: Payer: Self-pay

## 2019-05-29 ENCOUNTER — Encounter (INDEPENDENT_AMBULATORY_CARE_PROVIDER_SITE_OTHER): Payer: Self-pay | Admitting: Family Medicine

## 2019-05-30 NOTE — Telephone Encounter (Signed)
Routing to PCP as Juluis Rainier. -Thanks!    Advised patient to schedule a telemedicine visit.  Last seen on 08/02/18.

## 2019-06-04 ENCOUNTER — Other Ambulatory Visit (HOSPITAL_BASED_OUTPATIENT_CLINIC_OR_DEPARTMENT_OTHER): Payer: Self-pay | Admitting: Family Medicine

## 2019-06-04 DIAGNOSIS — N529 Male erectile dysfunction, unspecified: Secondary | ICD-10-CM

## 2019-06-06 MED ORDER — SILDENAFIL CITRATE 100 MG OR TABS
ORAL_TABLET | ORAL | 0 refills | Status: DC
Start: 2019-06-06 — End: 2019-12-04

## 2019-06-08 ENCOUNTER — Telehealth (INDEPENDENT_AMBULATORY_CARE_PROVIDER_SITE_OTHER): Payer: No Typology Code available for payment source | Admitting: Family Medicine

## 2019-06-08 DIAGNOSIS — R053 Chronic cough: Secondary | ICD-10-CM

## 2019-06-08 DIAGNOSIS — R05 Cough: Secondary | ICD-10-CM

## 2019-06-08 DIAGNOSIS — J302 Other seasonal allergic rhinitis: Secondary | ICD-10-CM

## 2019-06-08 MED ORDER — FLUTICASONE PROPIONATE 50 MCG/ACT NA SUSP
2.0000 | Freq: Every day | NASAL | 6 refills | Status: DC
Start: 2019-06-08 — End: 2021-10-28

## 2019-06-08 NOTE — Patient Instructions (Addendum)
1) Use nasal fluticasone (flonase) 2 sprays in each nostril once day (can be in morning or night)    I will also order the CT scan of lungs

## 2019-06-08 NOTE — Progress Notes (Addendum)
Chief Complaint   Patient presents with   . Cough     Distant Site Telemedicine Encounter    I conducted this encounter from [facility] via secure, live, face-to-face video conference with the patient. Jaric was located at home with  wife.  Prior to the interview, the risks and benefits of telemedicine were discussed with the patient and verbal consent was obtained.    Subjective:     Sean Oliver is a 58 year old male who presents on 06/08/2019 for the following concerns:     Cough: ongoing for 6-9 months, 1 year per wife. Has had episodes of intermittent cough x months in past and has been diagnosed with bronchitis and prescribed  antibiotics about every other. Has cough every day productive of sputum sometimes mucous, sometimes brown, sometimes white paste. Occurs most at night when lying on side. The other day noticed wheezing sound while sitting but that is the only time that has noticed it. Has been prescribed inhalers for bronchitis but has not taken regularly. Endorses rhinorrhea recently but not persistently. Endorses itchy eyes, throat at times for which will take benadryl prn. Has been prescribed flonase and uses prn. Had GERD previously but improved after treated with medications for a while, now no acid brash or heartburn.    I reviewed the patient's recorded medical history, and confirmed the following: medications, problem list, allergies and past medical history    Review of Systems   Constitutional: Negative for chills, fever and weight loss.   Respiratory: Negative for hemoptysis and shortness of breath.         Denies dyspnea on exertion   Cardiovascular: Negative for chest pain, orthopnea, leg swelling and PND.       Objective:   Vitals: There were no vitals taken for this visit.   Physical Exam  Constitutional:       General: He is not in acute distress.     Appearance: Normal appearance. He is not ill-appearing.   Pulmonary:      Effort: Pulmonary effort is normal.    Neurological:      Mental Status: He is alert.   Psychiatric:         Mood and Affect: Mood normal.         Behavior: Behavior normal.       Assessment and Plan:   1. Chronic cough  Possibly due to ACE-I side effect, so will stop lisinopril, start losartan as below. DDx also includes GERD (does have history but denies current symptoms), allergic rhinitis (having active symptoms), RAD (does have tobacco history but <2 years ~1cigar/day), malignancy, ILD. Given possible renal malignancy and prior lung nodules seen on CT abd discussed imaging and worried that CXR would not give enough info so will proceed with CT if cough not resolved with change to ARB. Will also treat for allergic rhinitis with daily fluticasone as below. If not improved and CT normal will consider PFTs.  - CT Chest wo Contrast; Future  - losartan 25 MG tablet; Take 1 tablet (25 mg) by mouth daily.  Dispense: 90 tablet; Refill: 3    2. Seasonal allergic rhinitis, unspecified trigger  - fluticasone propionate 50 MCG/ACT nasal spray; Spray 2 sprays into each nostril daily.  Dispense: 16 g; Refill: 6    Follow-up: 1 mo (in person would allow for exam so will offer this to patient)    Matthew Saras, MD  [place]  [place] [address]  [phone]

## 2019-06-12 ENCOUNTER — Telehealth (INDEPENDENT_AMBULATORY_CARE_PROVIDER_SITE_OTHER): Payer: Self-pay | Admitting: Family Medicine

## 2019-06-12 NOTE — Telephone Encounter (Signed)
-----   Message from Matthew Saras, MD sent at 06/08/2019 10:43 AM PST -----  Regarding: appointment  Please schedule for visit in 4-6 wks, in-person if willing (because then can examine lungs) but if not telemed okay  Thanks,     Alexa

## 2019-06-12 NOTE — Telephone Encounter (Signed)
Called the patient and scheduled appointment    Closing TE

## 2019-06-17 MED ORDER — LOSARTAN POTASSIUM 25 MG OR TABS
25.0000 mg | ORAL_TABLET | Freq: Every day | ORAL | 3 refills | Status: DC
Start: 2019-06-17 — End: 2019-08-21

## 2019-06-17 NOTE — Addendum Note (Signed)
Addended by: Matthew Saras on: 06/17/2019 01:43 PM     Modules accepted: Orders

## 2019-06-29 ENCOUNTER — Encounter (INDEPENDENT_AMBULATORY_CARE_PROVIDER_SITE_OTHER): Payer: Self-pay | Admitting: Family Medicine

## 2019-07-06 ENCOUNTER — Encounter (INDEPENDENT_AMBULATORY_CARE_PROVIDER_SITE_OTHER): Payer: Self-pay | Admitting: Family Medicine

## 2019-07-06 ENCOUNTER — Encounter (HOSPITAL_BASED_OUTPATIENT_CLINIC_OR_DEPARTMENT_OTHER): Payer: Self-pay | Admitting: Nursing

## 2019-07-06 ENCOUNTER — Encounter (HOSPITAL_BASED_OUTPATIENT_CLINIC_OR_DEPARTMENT_OTHER): Payer: Self-pay | Admitting: Nephrology

## 2019-07-06 ENCOUNTER — Encounter (INDEPENDENT_AMBULATORY_CARE_PROVIDER_SITE_OTHER): Payer: Self-pay | Admitting: Urology

## 2019-07-07 ENCOUNTER — Encounter (INDEPENDENT_AMBULATORY_CARE_PROVIDER_SITE_OTHER): Payer: Self-pay | Admitting: Family Medicine

## 2019-07-07 NOTE — Telephone Encounter (Signed)
Routing to PCP to please advise regarding patients message/request.    Letter pended for completion and approval.    -Thanks!

## 2019-07-17 ENCOUNTER — Telehealth (INDEPENDENT_AMBULATORY_CARE_PROVIDER_SITE_OTHER): Payer: Self-pay | Admitting: Family Medicine

## 2019-07-17 ENCOUNTER — Telehealth (INDEPENDENT_AMBULATORY_CARE_PROVIDER_SITE_OTHER): Payer: No Typology Code available for payment source | Admitting: Family Medicine

## 2019-07-17 ENCOUNTER — Inpatient Hospital Stay
Admission: EM | Admit: 2019-07-17 | Discharge: 2019-07-19 | DRG: 639 | Disposition: A | Discharge status: Home / Self Care | Payer: No Typology Code available for payment source | Attending: Internal Medicine | Admitting: Internal Medicine

## 2019-07-17 ENCOUNTER — Inpatient Hospital Stay (HOSPITAL_COMMUNITY): Payer: Self-pay | Admitting: Internal Medicine

## 2019-07-17 ENCOUNTER — Other Ambulatory Visit: Payer: Self-pay | Admitting: Internal Medicine

## 2019-07-17 DIAGNOSIS — E1100 Type 2 diabetes mellitus with hyperosmolarity without nonketotic hyperglycemic-hyperosmolar coma (NKHHC): Secondary | ICD-10-CM

## 2019-07-17 DIAGNOSIS — I160 Hypertensive urgency: Secondary | ICD-10-CM | POA: Diagnosis present

## 2019-07-17 DIAGNOSIS — Z20822 Contact with and (suspected) exposure to covid-19: Secondary | ICD-10-CM | POA: Diagnosis present

## 2019-07-17 DIAGNOSIS — E1165 Type 2 diabetes mellitus with hyperglycemia: Secondary | ICD-10-CM

## 2019-07-17 DIAGNOSIS — N183 Chronic kidney disease, stage 3 unspecified: Secondary | ICD-10-CM

## 2019-07-17 DIAGNOSIS — Z794 Long term (current) use of insulin: Secondary | ICD-10-CM

## 2019-07-17 DIAGNOSIS — N2889 Other specified disorders of kidney and ureter: Secondary | ICD-10-CM | POA: Diagnosis present

## 2019-07-17 DIAGNOSIS — R05 Cough: Secondary | ICD-10-CM

## 2019-07-17 DIAGNOSIS — E785 Hyperlipidemia, unspecified: Secondary | ICD-10-CM | POA: Diagnosis present

## 2019-07-17 DIAGNOSIS — G4733 Obstructive sleep apnea (adult) (pediatric): Secondary | ICD-10-CM | POA: Diagnosis present

## 2019-07-17 DIAGNOSIS — I110 Hypertensive heart disease with heart failure: Secondary | ICD-10-CM | POA: Diagnosis present

## 2019-07-17 DIAGNOSIS — R9431 Abnormal electrocardiogram [ECG] [EKG]: Secondary | ICD-10-CM

## 2019-07-17 DIAGNOSIS — E1122 Type 2 diabetes mellitus with diabetic chronic kidney disease: Secondary | ICD-10-CM | POA: Diagnosis present

## 2019-07-17 DIAGNOSIS — Z79899 Other long term (current) drug therapy: Secondary | ICD-10-CM

## 2019-07-17 LAB — BLOOD GAS, VENOUS, W/ HGB
Base Excess, Blood, VEN: 0.2 meq/L (ref 0.0–3.0)
Bicarbonate, VEN: 27 meq/L (ref 23–27)
Hemoglobin: 16.4 g/dL (ref 13.0–18.0)
O2 Saturation, VEN: 16 % — ABNORMAL LOW (ref 70–75)
pCO2, VEN: 52 mmHg — ABNORMAL HIGH (ref 42–50)
pH, VEN: 7.33 (ref 7.32–7.40)
pO2, VEN: 14 mmHg — ABNORMAL LOW (ref 35–40)

## 2019-07-17 LAB — STANDARD DRUG SCREEN, URN
Acetaminophen Qualitative, URN: NEGATIVE
Alcohol (Ethyl), URN: NEGATIVE mg/dL
Amphet/Methamphetamine Qual,URN: NEGATIVE
Barbiturate (Qual), URN: NEGATIVE
Benzodiazepines (Qual), URN: NEGATIVE
Cannabinoids (Qual), URN: POSITIVE — AB
Cocaine (Qual), URN: NEGATIVE
Methadone (Qual), URN: NEGATIVE
Opiates (Qual), URN: NEGATIVE
Phencyclidine (Qual), URN: NEGATIVE
Tricyclic Antidepressants, URN: NEGATIVE

## 2019-07-17 LAB — LAB ADD ON ORDER

## 2019-07-17 LAB — URINALYSIS WITH REFLEX CULTURE
Bacteria, URN: NONE SEEN
Bilirubin (Qual), URN: NEGATIVE
Epith Cells_Renal/Trans,URN: NEGATIVE /HPF
Epith Cells_Squamous, URN: NEGATIVE /LPF
Leukocyte Esterase, URN: NEGATIVE
Nitrite, URN: NEGATIVE
Specific Gravity, URN: <1.006 g/mL — ABNORMAL LOW (ref 1.006–1.027)
WBC, URN: NEGATIVE /HPF
pH, URN: 5.5 (ref 5.0–8.0)

## 2019-07-17 LAB — COMPREHENSIVE METABOLIC PANEL
ALT (GPT): 30 U/L (ref 10–48)
AST (GOT): 31 U/L (ref 9–38)
Albumin: 4 g/dL (ref 3.5–5.2)
Alkaline Phosphatase (Total): 113 U/L (ref 37–159)
Anion Gap: 9 (ref 4–12)
Bilirubin (Total): 0.5 mg/dL (ref 0.2–1.3)
Calcium: 9.9 mg/dL (ref 8.9–10.2)
Carbon Dioxide, Total: 26 meq/L (ref 22–32)
Chloride: 86 meq/L — ABNORMAL LOW (ref 98–108)
Creatinine: 1.68 mg/dL — ABNORMAL HIGH (ref 0.51–1.18)
Glucose: 870 mg/dL (ref 62–125)
Potassium: 5.1 meq/L (ref 3.6–5.2)
Protein (Total): 7.6 g/dL (ref 6.0–8.2)
Sodium: 121 meq/L — ABNORMAL LOW (ref 135–145)
Urea Nitrogen: 25 mg/dL — ABNORMAL HIGH (ref 8–21)
eGFR by CKD-EPI: 45 mL/min/{1.73_m2} — ABNORMAL LOW (ref >59)

## 2019-07-17 LAB — GLUCOSE, FINGERSTICK POC
Glucose, Finger Stick POC: 530 mg/dL (ref 62–125)
Glucose, Finger Stick POC: 443 mg/dL — ABNORMAL HIGH (ref 62–125)
Glucose, Finger Stick POC: 435 mg/dL — ABNORMAL HIGH (ref 62–125)
Glucose, Finger Stick POC: 301 mg/dL — ABNORMAL HIGH (ref 62–125)
Glucose, Finger Stick POC: 326 mg/dL — ABNORMAL HIGH (ref 62–125)
Glucose, Finger Stick POC: 291 mg/dL — ABNORMAL HIGH (ref 62–125)
Glucose, Finger Stick POC: 222 mg/dL — ABNORMAL HIGH (ref 62–125)

## 2019-07-17 LAB — MAGNESIUM: Magnesium: 2.2 mg/dL (ref 1.8–2.4)

## 2019-07-17 LAB — BASIC METABOLIC PANEL
Anion Gap: 8 (ref 4–12)
Calcium: 9.2 mg/dL (ref 8.9–10.2)
Carbon Dioxide, Total: 26 meq/L (ref 22–32)
Chloride: 97 meq/L — ABNORMAL LOW (ref 98–108)
Creatinine: 1.43 mg/dL — ABNORMAL HIGH (ref 0.51–1.18)
Glucose: 363 mg/dL — ABNORMAL HIGH (ref 62–125)
Potassium: 3.6 meq/L (ref 3.6–5.2)
Sodium: 131 meq/L — ABNORMAL LOW (ref 135–145)
Urea Nitrogen: 23 mg/dL — ABNORMAL HIGH (ref 8–21)
eGFR by CKD-EPI: 54 mL/min/{1.73_m2} — ABNORMAL LOW (ref >59)

## 2019-07-17 LAB — CBC, DIFF
% Basophils: 0 %
% Eosinophils: 0 %
% Immature Granulocytes: 0 %
% Lymphocytes: 17 %
% Monocytes: 6 %
% Neutrophils: 77 %
% Nucleated RBC: 0 %
Absolute Eosinophil Count: 0.03 10*3/uL (ref 0.00–0.50)
Absolute Lymphocyte Count: 1.16 10*3/uL (ref 1.00–4.80)
Basophils: 0.02 10*3/uL (ref 0.00–0.20)
Hematocrit: 48 % (ref 38–50)
Hemoglobin: 15.5 g/dL (ref 13.0–18.0)
Immature Granulocytes: 0.03 10*3/uL (ref 0.00–0.05)
MCH: 27.8 pg (ref 27.3–33.6)
MCHC: 32.4 g/dL (ref 32.2–36.5)
MCV: 86 fL (ref 81–98)
Monocytes: 0.41 10*3/uL (ref 0.00–0.80)
Neutrophils: 5.27 10*3/uL (ref 1.80–7.00)
Nucleated RBC: 0 10*3/uL
Platelet Count: 396 10*3/uL (ref 150–400)
RBC: 5.57 10*6/uL (ref 4.40–5.60)
RDW-CV: 13.1 % (ref 11.6–14.4)
WBC: 6.92 10*3/uL (ref 4.3–10.0)

## 2019-07-17 LAB — COVID-19 QUALITATIVE PCR INDICATION

## 2019-07-17 LAB — PROCALCITONIN (NWH): Procalcitonin: 0.11 ng/mL (ref 0–0.49)

## 2019-07-17 LAB — BETA HYDROXYBUTYRATE: Beta Hydroxybutyrate: 1.81 mmol/L — ABNORMAL HIGH (ref <0.30)

## 2019-07-17 LAB — OSMOLALITY: Osmolality: 325 mosm/kg — ABNORMAL HIGH (ref 280–300)

## 2019-07-17 NOTE — Telephone Encounter (Signed)
Had visit scheduled today but went to ED today for hyperglycemia. States that a few days after our visit in Dec glucoses became elevated. Now in ED with glucose 870, elevated beta-hydroxybutyrate but normal anion gap & pH 7.33. Will make sure to arrange f/u after ED visit/hospital stay.

## 2019-07-18 ENCOUNTER — Encounter (INDEPENDENT_AMBULATORY_CARE_PROVIDER_SITE_OTHER): Payer: Self-pay | Admitting: Family Medicine

## 2019-07-18 DIAGNOSIS — E1100 Type 2 diabetes mellitus with hyperosmolarity without nonketotic hyperglycemic-hyperosmolar coma (NKHHC): Secondary | ICD-10-CM

## 2019-07-18 DIAGNOSIS — I129 Hypertensive chronic kidney disease with stage 1 through stage 4 chronic kidney disease, or unspecified chronic kidney disease: Secondary | ICD-10-CM

## 2019-07-18 DIAGNOSIS — N183 Chronic kidney disease, stage 3 unspecified: Secondary | ICD-10-CM

## 2019-07-18 DIAGNOSIS — I160 Hypertensive urgency: Secondary | ICD-10-CM

## 2019-07-18 LAB — URINE C/S: Culture: 1000 — AB

## 2019-07-18 LAB — BASIC METABOLIC PANEL
Anion Gap: 5 (ref 4–12)
Anion Gap: 8 (ref 4–12)
Calcium: 9 mg/dL (ref 8.9–10.2)
Calcium: 9.3 mg/dL (ref 8.9–10.2)
Carbon Dioxide, Total: 29 meq/L (ref 22–32)
Carbon Dioxide, Total: 29 meq/L (ref 22–32)
Chloride: 103 meq/L (ref 98–108)
Chloride: 99 meq/L (ref 98–108)
Creatinine: 1.45 mg/dL — ABNORMAL HIGH (ref 0.51–1.18)
Creatinine: 1.42 mg/dL — ABNORMAL HIGH (ref 0.51–1.18)
Glucose: 128 mg/dL — ABNORMAL HIGH (ref 62–125)
Glucose: 210 mg/dL — ABNORMAL HIGH (ref 62–125)
Potassium: 3.4 meq/L — ABNORMAL LOW (ref 3.6–5.2)
Potassium: 3.3 meq/L — ABNORMAL LOW (ref 3.6–5.2)
Sodium: 137 meq/L (ref 135–145)
Sodium: 136 meq/L (ref 135–145)
Urea Nitrogen: 19 mg/dL (ref 8–21)
Urea Nitrogen: 17 mg/dL (ref 8–21)
eGFR by CKD-EPI: 53 mL/min/{1.73_m2} — ABNORMAL LOW (ref >59)
eGFR by CKD-EPI: 55 mL/min/{1.73_m2} — ABNORMAL LOW (ref >59)

## 2019-07-18 LAB — GLUCOSE, FINGERSTICK POC
Glucose, Finger Stick POC: 109 mg/dL (ref 62–125)
Glucose, Finger Stick POC: 112 mg/dL (ref 62–125)
Glucose, Finger Stick POC: 130 mg/dL — ABNORMAL HIGH (ref 62–125)
Glucose, Finger Stick POC: 133 mg/dL — ABNORMAL HIGH (ref 62–125)
Glucose, Finger Stick POC: 148 mg/dL — ABNORMAL HIGH (ref 62–125)
Glucose, Finger Stick POC: 176 mg/dL — ABNORMAL HIGH (ref 62–125)
Glucose, Finger Stick POC: 181 mg/dL — ABNORMAL HIGH (ref 62–125)
Glucose, Finger Stick POC: 193 mg/dL — ABNORMAL HIGH (ref 62–125)
Glucose, Finger Stick POC: 247 mg/dL — ABNORMAL HIGH (ref 62–125)
Glucose, Finger Stick POC: 272 mg/dL — ABNORMAL HIGH (ref 62–125)
Glucose, Finger Stick POC: 293 mg/dL — ABNORMAL HIGH (ref 62–125)
Glucose, Finger Stick POC: 433 mg/dL — ABNORMAL HIGH (ref 62–125)
Glucose, Finger Stick POC: 436 mg/dL — ABNORMAL HIGH (ref 62–125)
Glucose, Finger Stick POC: 491 mg/dL — ABNORMAL HIGH (ref 62–125)
Glucose, Finger Stick POC: 90 mg/dL (ref 62–125)
Glucose, Finger Stick POC: >600 mg/dL (ref 62–125)

## 2019-07-18 LAB — EKG 12 LEAD
Atrial Rate: 67 {beats}/min
Diagnosis: NORMAL
P Axis: 44 degrees
P-R Interval: 142 ms
Q-T Interval: 440 ms
QRS Duration: 102 ms
QTC Calculation: 464 ms
R Axis: -60 degrees
T Axis: 119 degrees
Ventricular Rate: 67 {beats}/min

## 2019-07-18 LAB — CBC, DIFF
% Basophils: 0 %
% Eosinophils: 1 %
% Immature Granulocytes: 0 %
% Lymphocytes: 26 %
% Monocytes: 7 %
% Neutrophils: 66 %
% Nucleated RBC: 0 %
Absolute Eosinophil Count: 0.08 10*3/uL (ref 0.00–0.50)
Absolute Lymphocyte Count: 1.87 10*3/uL (ref 1.00–4.80)
Basophils: 0.03 10*3/uL (ref 0.00–0.20)
Hematocrit: 45 % (ref 38–50)
Hemoglobin: 15.1 g/dL (ref 13.0–18.0)
Immature Granulocytes: 0.02 10*3/uL (ref 0.00–0.05)
MCH: 27.8 pg (ref 27.3–33.6)
MCHC: 33.5 g/dL (ref 32.2–36.5)
MCV: 83 fL (ref 81–98)
Monocytes: 0.51 10*3/uL (ref 0.00–0.80)
Neutrophils: 4.74 10*3/uL (ref 1.80–7.00)
Nucleated RBC: 0 10*3/uL
Platelet Count: 418 10*3/uL — ABNORMAL HIGH (ref 150–400)
RBC: 5.44 10*6/uL (ref 4.40–5.60)
RDW-CV: 12.9 % (ref 11.6–14.4)
WBC: 7.25 10*3/uL (ref 4.3–10.0)

## 2019-07-18 LAB — COVID-19 CORONAVIRUS QUALITATIVE PCR: COVID-19 Coronavirus Qual PCR Result: NOT DETECTED

## 2019-07-18 LAB — LAB ADD ON ORDER

## 2019-07-18 LAB — MAGNESIUM: Magnesium: 2 mg/dL (ref 1.8–2.4)

## 2019-07-18 LAB — HEMOGLOBIN A1C, HPLC: Hemoglobin A1C: 13.3 % — ABNORMAL HIGH (ref 4.0–6.0)

## 2019-07-19 DIAGNOSIS — E1122 Type 2 diabetes mellitus with diabetic chronic kidney disease: Secondary | ICD-10-CM

## 2019-07-19 DIAGNOSIS — E1100 Type 2 diabetes mellitus with hyperosmolarity without nonketotic hyperglycemic-hyperosmolar coma (NKHHC): Secondary | ICD-10-CM

## 2019-07-19 DIAGNOSIS — N183 Chronic kidney disease, stage 3 unspecified: Secondary | ICD-10-CM

## 2019-07-19 DIAGNOSIS — I160 Hypertensive urgency: Secondary | ICD-10-CM

## 2019-07-19 LAB — BASIC METABOLIC PANEL
Anion Gap: 8 (ref 4–12)
Calcium: 9.1 mg/dL (ref 8.9–10.2)
Carbon Dioxide, Total: 26 meq/L (ref 22–32)
Chloride: 99 meq/L (ref 98–108)
Creatinine: 1.55 mg/dL — ABNORMAL HIGH (ref 0.51–1.18)
Glucose: 332 mg/dL — ABNORMAL HIGH (ref 62–125)
Potassium: 3.9 meq/L (ref 3.6–5.2)
Sodium: 133 meq/L — ABNORMAL LOW (ref 135–145)
Urea Nitrogen: 17 mg/dL (ref 8–21)
eGFR by CKD-EPI: 49 mL/min/{1.73_m2} — ABNORMAL LOW (ref >59)

## 2019-07-19 LAB — LAB ADD ON ORDER

## 2019-07-19 LAB — GLUCOSE, FINGERSTICK POC
Glucose, Finger Stick POC: 249 mg/dL — ABNORMAL HIGH (ref 62–125)
Glucose, Finger Stick POC: 308 mg/dL — ABNORMAL HIGH (ref 62–125)
Glucose, Finger Stick POC: 395 mg/dL — ABNORMAL HIGH (ref 62–125)

## 2019-07-19 LAB — POTASSIUM, SERUM: Potassium: 3.8 meq/L (ref 3.6–5.2)

## 2019-07-19 NOTE — Telephone Encounter (Signed)
Routing to PCP as FYI. -Thanks!

## 2019-07-21 ENCOUNTER — Telehealth (INDEPENDENT_AMBULATORY_CARE_PROVIDER_SITE_OTHER): Payer: Self-pay

## 2019-07-21 NOTE — Telephone Encounter (Signed)
Transitional Care Management (TCM) - Post-Discharge Communication within 2 business days    Hospital Name:  [facility] Admission date:  2/1  Hospital Discharge date:  2/3  Reason for Hospitalization:  Dizziness  DIAGNOSIS:  1. Hyperosmolar hyperglycemic nonketotic syndrome  2. Diabetes  3. Chronic kidney disease, stage III  4. Hypertension urgency  Discharge Provider:  Dr Jeanella Flattery within two business days of discharge date:  YES Reached patient  YES  Contact type:  Telephone      Patient scheduled for visit with provider (within 7 days high complexity or within 14th days for moderate complexity) YES .   Appointment Date:  2/8 C Pitre 0840    Referrals, home health or community services planned on discharge:  NO.    Services received or scheduled:  N/A    Medications on discharge from hospital:    RECOMMENDATIONS:  1. Check glucose before meals at bedtime.  2. Schedule for abdominal CT scan outpatient.  3. Follow-up with primary care physician Dr. Belenda Cruise on 07/24/2019  DISCHARGE MEDICATIONS:  New medications:  1. Insulin glargine 20 units subcutaneously nightly.  2. Insulin aspart 6 units subcutaneously 3 times daily before meal and sliding scale  Continue outpatient medications:  1. Nifedipine 60 mg p.o. twice daily (increased from 30 mg twice daily)  2. Atorvastatin 40 mg p.o. daily  3. Albuterol PN shortness of breath  4. Losartan 25 mg p.o. daily  5. Fluticasone nasal spray daily  6. Calcium carbonate PN indigestion  7. Metoprolol succinate 50 mg p.o. daily  DISCONTINUED MEDICATION:  Metformin    Review of Discharge Instructions: YES   Patient Understands instructions: NO did not understand instructions regarding basaglar so has not been taking it    Taking medications as prescribed:  NO did not know he was supposed to be taking both the basaglar and the novolog  Any side effects of medication reported:  NO    Patient reports he/she is able to care for self:  YES  Caregiver involved: YES   Social Support: wife     Patient reports concerns:  YES: didn't understand insulin instructions reviewed and will start basaglar in the am per his preference and will continue the novolg at 6 and SSC coverage based on BG.     Patient was advised to call the clinic if any concerns noted prior to follow-up appointment with provider.      Patient requests referral to endocrinology      Care Manager  [facility]

## 2019-07-24 ENCOUNTER — Ambulatory Visit (INDEPENDENT_AMBULATORY_CARE_PROVIDER_SITE_OTHER): Payer: No Typology Code available for payment source | Admitting: Physician Assistant

## 2019-07-24 ENCOUNTER — Encounter (INDEPENDENT_AMBULATORY_CARE_PROVIDER_SITE_OTHER): Payer: Self-pay | Admitting: Physician Assistant

## 2019-07-24 VITALS — BP 148/87 | HR 68 | Temp 98.2°F | Resp 20 | Wt 220.0 lb

## 2019-07-24 DIAGNOSIS — E1100 Type 2 diabetes mellitus with hyperosmolarity without nonketotic hyperglycemic-hyperosmolar coma (NKHHC): Secondary | ICD-10-CM

## 2019-07-24 DIAGNOSIS — N1831 Chronic kidney disease, stage 3a: Secondary | ICD-10-CM

## 2019-07-24 DIAGNOSIS — I10 Essential (primary) hypertension: Secondary | ICD-10-CM

## 2019-07-24 LAB — ALBUMIN/CREATININE RATIO, RANDOM URINE
Albumin (Micro), URN: 36.35 mg/dL
Albumin/Creatinine Ratio, URN: 387 mg/g{creat} — ABNORMAL HIGH (ref <30)
Creatinine/Unit, URN: 94 mg/dL

## 2019-07-24 MED ORDER — NIFEDIPINE ER 30 MG OR TB24
60.0000 mg | EXTENDED_RELEASE_TABLET | Freq: Two times a day (BID) | ORAL | 1 refills | Status: DC
Start: 2019-07-24 — End: 2019-11-01

## 2019-07-24 NOTE — Patient Instructions (Signed)
1. Continue to hold sugary drinks and check you blood sugars.     2. Call to schedule with the endocrinologist.

## 2019-07-24 NOTE — Progress Notes (Signed)
[facility] NOTE    Subjective:     Chief Complaint   Patient presents with   . Hospital Follow-up     blood glucose checks     Sean Oliver is a 59 year old male who presents on 07/24/2019 for hospital follow-up:    Transitional Care Management Provider Note  Hospital Name:  [facility]  Hospital Admission date:  07/17/2019  Hospital Discharge date:  07/19/2019  Patient received post-discharge communication within 2 business days:  [x]YES  []NO  Reason for hospitalization and other complicating diagnoses:   1. Hyperosmolar hyperglycemic nonketotic syndrome  2. Diabetes  3. Chronic kidney disease, stage III  4. Hypertension urgency    Interval history since hospitalization and pertinent history from hospitalization:   He was not checking home BS regularly prior to admission.   He is unclear as to trigger or acute change as had no significant diet or lifestyle changes in last year.   Does report that some weeks ago, did develop increasing thirst and found he was drinking more.   At this time and historically, he drinks apple juice (breakfast), snapple and sweetened tea.   H/o well-controlled T2DM over last 17 years, on metformin monotherapy    Checking home BS now (see below).   Taking insulin Glargine 20 units (last 2 days only)  Continued since d/c on insulin Aspart TID SS (avg 13u yesterday, he reports).     Now taking Nifedipine ER 60mg BID (increased from 30mg BID).   Continued Losartan 25mg QD, metoprolol 50mg QD, atorvastatin 40mg QD.     BS LOG from glucometer:  2/5 290 314 308   2/6 327 278 234 (start basal insulin after this)  2/7 210 248 284   2/8 135 226    Records reviewed for this visit:   [x]Hospital Admit Note   [x]Hospital Discharge Note       [x]Laboratory reviewed   [x]Imaging reviewed     [x]Consultations reviewed     Home health agency?  []YES  [x]NO    Medication reconciliation done as required.   [x]    Interpreter needed? NO    I reviewed the patient's recorded medical history, and  confirmed the medications, problem list, and allergies with the patient.     ROS: Per HPI    Objective:   BP (!) 148/87   Pulse 68   Temp 98.2 F (36.8 C) (Temporal)   Resp 20   Wt (!) 220 lb (99.8 kg)   SpO2 99%   BMI 29.84 kg/m   GEN: No acute distress, alert. Ambulating without assistance.  HEENT: Normocephalic, atraumatic.  CV: RRR, S1S2 without m/r/g.   CHEST: Nml respiratory rate and effort. LCTAB without r/r/w.  EXT: No clubbing, cyanosis or edema. Palpable, symmetric radial pulses.   SKIN: Warm, dry and intact. No rash or other lesion observed.      DATA: review of most recent and prior data    Results for Oliver, Sean EDWARD (MRN [id number]) as of 07/24/2019 09:39   Ref. Range 02/05/2014 00:00 10/18/2014 00:00 04/07/2017 11:37 08/02/2018 11:30 03/07/2019 14:45 07/17/2019 12:34   Hemoglobin A1C Latest Ref Range: 4.0 - 6.0 % 5.8 6.4 (H) 6.4 (H) 7.5 (H) 6.8 (H) 13.3 (H)     Results for Oliver, Sean EDWARD (MRN [id number]) as of 07/24/2019 09:39   Ref. Range 02/05/2014 10:37 10/18/2014 14:35 04/09/2016 09:09 04/07/2017 11:14 04/19/2017 11:01 05/10/2017 15:00 07/05/2017 16:47 09/16/2017 11:03 08/02/2018 11:06 03/07/2019 14:46 03/20/2019   08:44 07/17/2019 12:34 07/17/2019 19:20 07/18/2019 06:16 07/18/2019 10:00 07/19/2019 07:30   Creatinine Latest Ref Range: 0.51 - 1.18 mg/dL 1.46 (H) 1.43 (H) 1.37 (H) 1.60 (H) 1.61 (H) 1.5 (H) 1.51 (H) 1.49 (H) 1.54 (H) 1.62 (H) 1.7 (H) 1.68 (H) 1.43 (H) 1.45 (H) 1.42 (H) 1.55 (H)   eGFR, Calculated Latest Ref Range: >59 mL/min/1.73_m2          47 (L)  45 (L) 54 (L) 53 (L) 55 (L) 49 (L)   GFR, Calc, African American Latest Ref Range: >59 mL/min/1.73_m2 >60 >60 >60 55 (L) 54 (L)  59 (L) 60 57 (L)            Assessment and Plan:   # 59 year old male with:  1. Type 2 diabetes mellitus with hyperosmolar nonketotic hyperglycemia (HCC)  Longstanding type 2 diabetic well controlled over ~17 years with metformin (was briefly on insulin at one point). Reports weeks of increased thirst, drinking increased volume  of his usual beverages (apple juice, snapple, sweet tea). We discussed natural progression of diabetes with "pancreatic fatigue" 1-2 decades after diagnosis and possibility that increased sugary drinks (due to increased thirst of worsening glycemic control) resulted in hyperosmolar hyperglycemic crisis.  He was not checking home BS regularly, so chronology of this hyperglycemic insult is unclear.    Currently, he is taking basal and short-acting insulins as prescribed (avg 13 units of insulin Aspart yesterday). Metformin was discontinued. Blood sugars today are 135 FBS and 226 AM post-prandial today. Discussed fasting and after meal BS goals. No medication changes today. We discussed possibility of SLGT2 empagliflozin (Jardiance) as oral therapy for T2DM & CKD. Referral to endocrinology written.   - URINE SCREEN, MICROALBUMINURIA  - REFERRAL TO ENDOCRINOLOGY    2. Essential hypertension  Elevated today, borderline. He is taking increased CCB dose from hospital d/c. Continued on ARB and BB (unchanged). He has home BP cuff that is not operational (was using fire station). Discussed target BP and target range for him, advised new BP cuff and monitor for persistent readings above goal range. Follow-up with PCP 1 month for recheck.   - NIFEdipine ER 30 MG tablet extended-release 24 hour; Take 2 tablets (60 mg) by mouth 2 times a day.  Dispense: 180 tablet; Refill: 1    3. Stage 3a chronic kidney disease  With proteinuria. Will recheck urine ACR today, last 07/2018 was >700. Followed by nephrology. Last note mentions primarily hypertensive nephrosclerosis as etiology with possible "element of diabetic nephropathy" and consideration of SLGT2 therapy mentioned.     Follow-up: 1 month with PCP    Christopher P Pitre, PA-C  [facility]  [address]  [place] [address]  [phone]      Active Ambulatory Problems     Diagnosis Date Noted   . Controlled type 2 diabetes mellitus with  microalbuminuria, without long-term current use of insulin    . History of squamous cell carcinoma of skin 04/02/2015   . Marijuana abuse 03/03/2016   . Mediastinal lymphadenopathy 03/03/2016   . Pulmonary nodule, right 03/03/2016   . Essential hypertension 04/07/2017   . CKD (chronic kidney disease) stage 3, GFR 30-59 ml/min 04/26/2017   . Proteinuria 04/26/2017   . Other hyperlipidemia 04/26/2017   . OSA (obstructive sleep apnea) 08/02/2018     Resolved Ambulatory Problems     Diagnosis Date Noted   . Renal cell carcinoma of left kidney (HCC) 09/16/2017     Past   Medical History:   Diagnosis Date   . Cancer (HCC)    . Diabetes mellitus (HCC)    . Hypertension    . Kidney disease    . Lipidemia    . Lung disease    . Type II or unspecified type diabetes mellitus without mention of complication, not stated as uncontrolled        Patient's Medications   New Prescriptions    No medications on file   Previous Medications    ACCU-CHEK FASTCLIX LANCETS MISCELLANEOUS        ALBUTEROL HFA 108 (90 BASE) MCG/ACT INHALER    Inhale 2 puffs by mouth every 4 hours as needed for shortness of breath/wheezing.    ATORVASTATIN 40 MG TABLET    Take 1 tablet (40 mg) by mouth daily.    BD PEN NEEDLE NANO U/F 32G X 4 MM MISCELLANEOUS        FLUTICASONE PROPIONATE 50 MCG/ACT NASAL SPRAY    Spray 2 sprays into each nostril daily.    INSULIN GLARGINE (BASAGLAR KWIKPEN) 100 UNIT/ML PEN-INJECTOR        LOSARTAN 25 MG TABLET    Take 1 tablet (25 mg) by mouth daily.    METOPROLOL SUCCINATE ER 25 MG 24 HR TABLET    Take 2 tablets (50 mg) by mouth daily. Do not chew or crush.    NOVOLOG FLEXPEN 100 UNIT/ML PEN-INJECTOR        SILDENAFIL 100 MG TABLET    Take 1 tablet by mouth daily as needed for erectile dysfunction. Don't take with other blood pressure meds.   Modified Medications    Modified Medication Previous Medication    NIFEDIPINE ER 30 MG TABLET EXTENDED-RELEASE 24 HOUR NIFEdipine ER 30 MG tablet extended-release 24 hour       Take 2  tablets (60 mg) by mouth 2 times a day.    Take 1 tablet (30 mg) by mouth 2 times a day.   Discontinued Medications    METFORMIN 1000 MG TABLET    Take 1 tablet (1,000 mg) by mouth 2 times a day.

## 2019-07-25 ENCOUNTER — Telehealth (INDEPENDENT_AMBULATORY_CARE_PROVIDER_SITE_OTHER): Payer: Self-pay | Admitting: Physician Assistant

## 2019-07-25 NOTE — Addendum Note (Signed)
Addended by: Elijah Birk on: 07/25/2019 08:17 AM     Modules accepted: Orders

## 2019-07-25 NOTE — Telephone Encounter (Signed)
Endo referral upgraded to STAT, I Called [facility] to see if they can get pt a sooner appt, unfortunately May is the earliest they can get pt an appt.     I redirected Endo referral to [facility] for review.     Called pt and provided him phone # to specialist office and to f/u  if he doesn't hear from scheduling by the end of today/ early tomorrow morning.       Routing to Referral Pool to check scheduling status

## 2019-07-26 NOTE — Telephone Encounter (Signed)
Urgent referral inbasket message sent-will check status tomorrow.    [facility] schedulers,  Please see this urgent referral.  Schedule patient ASAP     *When done, please route back to [place]*      Referral Team  [facility]  [phone]

## 2019-07-27 NOTE — Telephone Encounter (Signed)
Pt scheduled with Ewa Gentry on 08/08/2019    Closing TE    Routing to provider as an Reeder

## 2019-08-08 ENCOUNTER — Encounter (HOSPITAL_BASED_OUTPATIENT_CLINIC_OR_DEPARTMENT_OTHER): Payer: Self-pay | Admitting: Family

## 2019-08-08 ENCOUNTER — Ambulatory Visit (HOSPITAL_BASED_OUTPATIENT_CLINIC_OR_DEPARTMENT_OTHER): Payer: No Typology Code available for payment source | Attending: Family | Admitting: Family

## 2019-08-08 ENCOUNTER — Ambulatory Visit (HOSPITAL_BASED_OUTPATIENT_CLINIC_OR_DEPARTMENT_OTHER): Payer: No Typology Code available for payment source | Admitting: Registered"

## 2019-08-08 VITALS — BP 159/88 | HR 50 | Temp 97.2°F | Ht 72.0 in | Wt 233.0 lb

## 2019-08-08 DIAGNOSIS — N1832 Chronic kidney disease, stage 3b: Secondary | ICD-10-CM

## 2019-08-08 DIAGNOSIS — Z713 Dietary counseling and surveillance: Secondary | ICD-10-CM

## 2019-08-08 DIAGNOSIS — E1122 Type 2 diabetes mellitus with diabetic chronic kidney disease: Secondary | ICD-10-CM

## 2019-08-08 DIAGNOSIS — I10 Essential (primary) hypertension: Secondary | ICD-10-CM | POA: Insufficient documentation

## 2019-08-08 DIAGNOSIS — Z794 Long term (current) use of insulin: Secondary | ICD-10-CM

## 2019-08-08 DIAGNOSIS — E1121 Type 2 diabetes mellitus with diabetic nephropathy: Secondary | ICD-10-CM | POA: Insufficient documentation

## 2019-08-08 MED ORDER — BASAGLAR KWIKPEN 100 UNIT/ML SC SOPN
20.0000 [IU] | PEN_INJECTOR | Freq: Every morning | SUBCUTANEOUS | 1 refills | Status: DC
Start: 2019-08-08 — End: 2020-02-14

## 2019-08-08 MED ORDER — NOVOLOG FLEXPEN 100 UNIT/ML SC SOPN
6.0000 [IU] | PEN_INJECTOR | Freq: Every day | SUBCUTANEOUS | 5 refills | Status: DC
Start: 2019-08-08 — End: 2019-08-22

## 2019-08-08 MED ORDER — EMPAGLIFLOZIN 10 MG OR TABS
10.0000 mg | ORAL_TABLET | Freq: Every day | ORAL | 1 refills | Status: DC
Start: 2019-08-08 — End: 2019-09-06
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
## 2019-08-08 NOTE — Patient Instructions (Addendum)
Diabetes Medications:  Basaglar (glargine) 20u in the morning  Novolog (aspart) 6u before dinner with a correction of 1u for every 50mg /dL above 150    Blood Glucose  Insulin  90-149   6u  150-199   7u  200-249   8u  250-299   9u  >300   10u    Follow-up with Neo Yepiz W ARNP in 1 month

## 2019-08-09 ENCOUNTER — Other Ambulatory Visit (INDEPENDENT_AMBULATORY_CARE_PROVIDER_SITE_OTHER): Payer: Self-pay | Admitting: Family Medicine

## 2019-08-09 ENCOUNTER — Telehealth (HOSPITAL_BASED_OUTPATIENT_CLINIC_OR_DEPARTMENT_OTHER): Payer: Self-pay

## 2019-08-09 DIAGNOSIS — E1129 Type 2 diabetes mellitus with other diabetic kidney complication: Secondary | ICD-10-CM

## 2019-08-09 DIAGNOSIS — E785 Hyperlipidemia, unspecified: Secondary | ICD-10-CM

## 2019-08-09 DIAGNOSIS — I10 Essential (primary) hypertension: Secondary | ICD-10-CM

## 2019-08-09 NOTE — Progress Notes (Signed)
Nutrition Note    Clinic:  Endocrinology/[facility]    Note Type: New  Referral Sean Oliver pending     Reason For Visit: Diabetes management    An interpreter was not needed for the visit.    Assessment    Patient Active Problem List   Diagnosis   . Type 2 diabetes mellitus with stage 3b chronic kidney disease, with long-term current use of insulin ([name])   . History of squamous cell carcinoma of skin   . Marijuana abuse   . Mediastinal lymphadenopathy   . Pulmonary nodule, right   . Essential hypertension   . CKD (chronic kidney disease) stage 3, GFR 30-59 ml/min   . Proteinuria   . Other hyperlipidemia   . OSA (obstructive sleep apnea)     Weight History: pt preferred weight 205lb  Vitals Weight (lbs) BMI (kg/m2)   08/08/2019 233 lb 31.6     Adult BMI Classification: obese categ. I (30-35)     Labs:   DIABETES [facility] AMB A1C   Latest Ref Rng & Units 4.0 - 6.0 %   02/05/2014 5.8   10/18/2014 6.4 (H)   04/07/2017 6.4 (H)   08/02/2018 7.5 (H)   03/07/2019 6.8 (H)   07/17/2019 13.3 (H)     Self monitored blood glucose: not discussed; in media 2/5- 08/08/19:   Avg 172mg /dL (n=62), numbers have improved over the course of the month  9am: 98- 327mg /dL (avg 167mg /dL)  3pm: 97- 314mg /dL (avg 170mg /dL)  9pm: 106- 308mg /dL (avg 185mg /dL)    Medications include:   Basaglar 20 units  Novalog 6 units before dinner plus correction (1unit for every 50mg /dL >150mg /dL)  Empagliflozin 10mg   Losartan, Atorvastatin    Vitamins/Minerals/Herbal Supplements: not discussed     Barriers to adequate intake: None    GI tolerance: None    Activity: loves to golf, tries to walk vs using cart, goes 3x/wk    Food intolerances/allergies: None    Diet History:   Details not discussed.  Sean Oliver wants to know if he can occasionally have ice cream, cookies, beer  He reports that he has controlled weight as an adult by skipping meals     Information from Patient:   Well controlled diabetes for years, he and his wife Sean Oliver puzzled by sudden increase. Sean Oliver does  mention Sean Oliver had recent infection in his mouth for quite some time.    Sean Oliver would prefer to not use insulin    Assessment Summary: Recent admit to [facility] 2/1- 2/3 for HHS and hypertensive urgency.  Started on insulin while inpt.     Nutrition Diagnosis: need for nutrition review related to HHS dx and initiation of insulin evidenced by medical record  Pt and wife have good understanding of nutrition information presented.   Pt main goal to stop insulin and lose weight    Interventions and Patient Goals:   Reviewed carbohydrate counting and reassured pt he can have ice cream/cookies as long as he stays within budget of 60g carb for meals and 15g for snacks  Emphasized importance of having food w/ alcohol and also to stay within 60g carb so be sure to count carbs in the beer  Stressed caution with skipping meals while using insulin to prevent hypoglycemai    Education Provided:  Please refer to Education section of chart     Education materials provided: My Food Plan    Time Spent with Patient: 30 mins    Plan/Recommendations    Follow up: prn  Total Time Spent:  45 mins      Ed Blalock, MS, RD, CD, CDE  [facility] Dietitian  [place]  [address], [place] [address]  Pager: [phone]

## 2019-08-09 NOTE — Telephone Encounter (Signed)
PAC received PA request from [place] [phone] [place]  for Jardiance 10mg   via fax:

## 2019-08-10 MED ORDER — METOPROLOL SUCCINATE ER 25 MG OR TB24
50.0000 mg | EXTENDED_RELEASE_TABLET | Freq: Every day | ORAL | 0 refills | Status: DC
Start: 2019-08-10 — End: 2019-11-01

## 2019-08-10 MED ORDER — ATORVASTATIN CALCIUM 40 MG OR TABS
40.0000 mg | ORAL_TABLET | Freq: Every day | ORAL | 0 refills | Status: DC
Start: 2019-08-10 — End: 2019-11-01

## 2019-08-16 NOTE — Telephone Encounter (Signed)
Received a PA request from [place] [phone] [id number]  for Jardiance 10MG .     Patient has [place] and medication was last filled 08-08-2019.    Next fill is available 08-31-2019.    No further action taken/needed by [facility] at this time.

## 2019-08-17 ENCOUNTER — Telehealth (INDEPENDENT_AMBULATORY_CARE_PROVIDER_SITE_OTHER): Payer: No Typology Code available for payment source | Admitting: Family Medicine

## 2019-08-21 ENCOUNTER — Telehealth (INDEPENDENT_AMBULATORY_CARE_PROVIDER_SITE_OTHER): Payer: No Typology Code available for payment source | Admitting: Physician Assistant

## 2019-08-21 ENCOUNTER — Encounter (INDEPENDENT_AMBULATORY_CARE_PROVIDER_SITE_OTHER): Payer: Self-pay | Admitting: Family Medicine

## 2019-08-21 ENCOUNTER — Encounter (INDEPENDENT_AMBULATORY_CARE_PROVIDER_SITE_OTHER): Payer: Self-pay | Admitting: Physician Assistant

## 2019-08-21 DIAGNOSIS — E1122 Type 2 diabetes mellitus with diabetic chronic kidney disease: Secondary | ICD-10-CM

## 2019-08-21 DIAGNOSIS — E1121 Type 2 diabetes mellitus with diabetic nephropathy: Secondary | ICD-10-CM

## 2019-08-21 DIAGNOSIS — N1832 Chronic kidney disease, stage 3b: Secondary | ICD-10-CM

## 2019-08-21 DIAGNOSIS — Z794 Long term (current) use of insulin: Secondary | ICD-10-CM

## 2019-08-21 DIAGNOSIS — I10 Essential (primary) hypertension: Secondary | ICD-10-CM

## 2019-08-21 MED ORDER — LOSARTAN POTASSIUM 50 MG OR TABS
50.0000 mg | ORAL_TABLET | Freq: Every day | ORAL | 3 refills | Status: DC
Start: 2019-08-21 — End: 2019-08-21

## 2019-08-21 MED ORDER — LOSARTAN POTASSIUM 50 MG OR TABS
50.0000 mg | ORAL_TABLET | Freq: Every day | ORAL | 3 refills | Status: DC
Start: 2019-08-21 — End: 2020-04-17

## 2019-08-21 NOTE — Progress Notes (Signed)
[facility] TELEMEDICINE NOTE    Subjective:     Chief Complaint   Patient presents with   . Follow-Up      I conducted this encounter from [facility] via secure, live, face-to-face video conference with the patient. Sean Oliver was located at home in [place]. Prior to the interview, the risks and benefits of telemedicine were discussed with the patient and verbal consent was obtained.      Sean Oliver is a 59 year old male who presents on 08/21/2019 for the following concerns:    # F/U HTN   He does not have home BP readings to report (has old machine, used to go to fire station but no longer d/t COVID)   Continued on Nifedipine ER 30mg  and Losartan 25mg  QD.    He had chronic cough and was switched from Lisinopril to Losartan, not titrated thereafter and f/u interrupted by his hospitalization.    Denies AE since starting Losartan, has questions about how it will affect kidneys.    # F/U T2DM, HHT hopitalization, CKD   He has established with endocrinology   Taking SLGT2 Jardiance 10mg  currently   On basal insulin and QD novolog with dinner (decreased from TID prandial insulin per endocrinology)   Has f/u scheduled later this month   Home logs reports: Yesterdays FBS 70, highest FBS 140 although often lower, he had FBS 200 ~1 week ago and attributes this to missed prandial insulin dose.    No vision change, dark urine.     Interpreter needed? NO    I reviewed the patient's recorded medical history, and confirmed the medications, problem list, and allergies with the patient.     ROS: Per HPI    Objective:   General:   [x]  Alert []  Lethargic   [x]  Well-appearing []  Ill-appearing   [x]  In no acute distress []  In acute distress     Respiratory:  [x]  Speaking in full sentences comfortably []  Unable to speak in full sentences comfortably   [x]  Normal work of breathing []  Increased work of breathing   [x]  No cough during visit []  Coughing during visit     No gross cranial nerve or mental status  deficits.   Coherent speech, thought process.    Assessment and Plan:   # 59 year old male with:  1. Essential hypertension  Not at goal per recent endocrinology visit or historical recent readings. He does not have home readings for review today. Continued on Nifedipine ER 30mg  and Losartan 25mg . PLAN for increase to Losartan 50mg  and monitor BP and BMP. F/u 1 month.   - losartan 50 MG tablet; Take 1 tablet (50 mg) by mouth daily.  Dispense: 90 tablet; Refill: 3    2. Type 2 diabetes mellitus with stage 3b chronic kidney disease, with long-term current use of insulin ([place])  Doing well, now established with endocrinology, on Jardiance 10mg , basal and QD prandial insulin with dinner. He has f/u scheduled later this month with endocrinology.       Follow-up: 1 month for BP check +/- BMP at that time (if not checked during endocrinology f/u)    [name]  [name]  [place] [phone]  [phone]      Active Ambulatory Problems     Diagnosis Date Noted   . Type 2 diabetes mellitus with stage 3b chronic kidney disease, with long-term current use of insulin ([place])    .  History of squamous cell carcinoma of skin 04/02/2015   . Marijuana abuse 03/03/2016   . Mediastinal lymphadenopathy 03/03/2016   . Pulmonary nodule, right 03/03/2016   . Essential hypertension 04/07/2017   . CKD (chronic kidney disease) stage 3, GFR 30-59 ml/min 04/26/2017   . Proteinuria 04/26/2017   . Other hyperlipidemia 04/26/2017   . OSA (obstructive sleep apnea) 08/02/2018     Resolved Ambulatory Problems     Diagnosis Date Noted   . Renal cell carcinoma of left kidney ([place]) 09/16/2017     Past Medical History:   Diagnosis Date   . Cancer ([place])    . Diabetes mellitus ([place])    . Hypertension    . Kidney disease    . Lipidemia    . Lung disease    . Type II or unspecified type diabetes mellitus without mention of complication, not stated as uncontrolled        Patient's Medications   New  Prescriptions    No medications on file   Previous Medications    ACCU-CHEK FASTCLIX LANCETS MISCELLANEOUS        ALBUTEROL HFA 108 (90 BASE) MCG/ACT INHALER    Inhale 2 puffs by mouth every 4 hours as needed for shortness of breath/wheezing.    ATORVASTATIN 40 MG TABLET    Take 1 tablet (40 mg) by mouth daily.    BD PEN NEEDLE NANO U/F 32G X 4 MM MISCELLANEOUS        EMPAGLIFLOZIN 10 MG TABLET    Take 1 tablet (10 mg) by mouth daily.    FLUTICASONE PROPIONATE 50 MCG/ACT NASAL SPRAY    Spray 2 sprays into each nostril daily.    INSULIN GLARGINE (BASAGLAR KWIKPEN) 100 UNIT/ML PEN-INJECTOR    Inject 20 Units under the skin every morning.    LOSARTAN 25 MG TABLET    Take 1 tablet (25 mg) by mouth daily.    METOPROLOL SUCCINATE ER 25 MG 24 HR TABLET    Take 2 tablets (50 mg) by mouth daily. Do not chew or crush    NIFEDIPINE ER 30 MG TABLET EXTENDED-RELEASE 24 HOUR    Take 2 tablets (60 mg) by mouth 2 times a day.    NOVOLOG FLEXPEN 100 UNIT/ML PEN-INJECTOR    Inject 6-10 Units under the skin daily with dinner. 6u base dose plus 1u:50>150    SILDENAFIL 100 MG TABLET    Take 1 tablet by mouth daily as needed for erectile dysfunction. Don't take with other blood pressure meds.   Modified Medications    No medications on file   Discontinued Medications    No medications on file

## 2019-08-22 ENCOUNTER — Other Ambulatory Visit (INDEPENDENT_AMBULATORY_CARE_PROVIDER_SITE_OTHER): Payer: Self-pay | Admitting: Family Medicine

## 2019-08-22 DIAGNOSIS — N1832 Chronic kidney disease, stage 3b: Secondary | ICD-10-CM

## 2019-08-22 DIAGNOSIS — Z794 Long term (current) use of insulin: Secondary | ICD-10-CM

## 2019-08-22 DIAGNOSIS — E1122 Type 2 diabetes mellitus with diabetic chronic kidney disease: Secondary | ICD-10-CM

## 2019-08-22 MED ORDER — GLUCOSE BLOOD VI STRP: 1.0000 | ORAL_STRIP | Freq: Three times a day (TID) | 11 refills | Status: DC

## 2019-08-22 MED ORDER — ONETOUCH VERIO W/DEVICE KIT
PACK | 0 refills | Status: AC
Start: 2019-08-22 — End: ?

## 2019-08-22 MED ORDER — NOVOLOG FLEXPEN 100 UNIT/ML SC SOPN
6.0000 [IU] | PEN_INJECTOR | Freq: Every day | SUBCUTANEOUS | 6 refills | Status: DC
Start: 2019-08-22 — End: 2020-03-21

## 2019-08-22 MED ORDER — ONETOUCH DELICA LANCETS 33G MISC: 1.0000 | Freq: Three times a day (TID) | 11 refills | Status: DC

## 2019-08-22 NOTE — Telephone Encounter (Signed)
Routing to PCP to please advise regarding patients message/request.    No rx pended at this time since they are new request.    -Thanks!

## 2019-09-06 ENCOUNTER — Encounter (HOSPITAL_BASED_OUTPATIENT_CLINIC_OR_DEPARTMENT_OTHER): Payer: Self-pay | Admitting: Family

## 2019-09-06 ENCOUNTER — Ambulatory Visit (HOSPITAL_BASED_OUTPATIENT_CLINIC_OR_DEPARTMENT_OTHER): Payer: No Typology Code available for payment source | Attending: Family | Admitting: Family

## 2019-09-06 VITALS — BP 140/88 | HR 53 | Temp 97.2°F | Wt 235.0 lb

## 2019-09-06 DIAGNOSIS — N1832 Chronic kidney disease, stage 3b: Secondary | ICD-10-CM | POA: Insufficient documentation

## 2019-09-06 DIAGNOSIS — E1121 Type 2 diabetes mellitus with diabetic nephropathy: Secondary | ICD-10-CM | POA: Insufficient documentation

## 2019-09-06 DIAGNOSIS — E1122 Type 2 diabetes mellitus with diabetic chronic kidney disease: Secondary | ICD-10-CM

## 2019-09-06 DIAGNOSIS — Z794 Long term (current) use of insulin: Secondary | ICD-10-CM | POA: Insufficient documentation

## 2019-09-06 MED ORDER — EMPAGLIFLOZIN 25 MG OR TABS
25.0000 mg | ORAL_TABLET | Freq: Every day | ORAL | 1 refills | Status: DC
Start: 2019-09-06 — End: 2020-02-20
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
## 2019-09-06 NOTE — Patient Instructions (Addendum)
Diabetes Medications:  Increased Jardiance (empagliflozin) to 25mg  daily  STOP NOVOLOG  Basaglar (glargine) 20u in the morning    Follow-up with PCP for ongoing management of T2DM

## 2019-09-07 ENCOUNTER — Other Ambulatory Visit (HOSPITAL_BASED_OUTPATIENT_CLINIC_OR_DEPARTMENT_OTHER): Payer: Self-pay | Admitting: Family

## 2019-09-07 DIAGNOSIS — N1832 Chronic kidney disease, stage 3b: Secondary | ICD-10-CM

## 2019-09-07 DIAGNOSIS — E1122 Type 2 diabetes mellitus with diabetic chronic kidney disease: Secondary | ICD-10-CM

## 2019-09-07 DIAGNOSIS — Z794 Long term (current) use of insulin: Secondary | ICD-10-CM

## 2019-09-07 NOTE — Progress Notes (Signed)
bmp 

## 2019-09-21 ENCOUNTER — Encounter (HOSPITAL_BASED_OUTPATIENT_CLINIC_OR_DEPARTMENT_OTHER): Payer: Self-pay

## 2019-09-22 ENCOUNTER — Encounter (HOSPITAL_BASED_OUTPATIENT_CLINIC_OR_DEPARTMENT_OTHER): Payer: Self-pay

## 2019-09-26 ENCOUNTER — Other Ambulatory Visit (HOSPITAL_BASED_OUTPATIENT_CLINIC_OR_DEPARTMENT_OTHER): Payer: Self-pay

## 2019-09-26 DIAGNOSIS — E1122 Type 2 diabetes mellitus with diabetic chronic kidney disease: Secondary | ICD-10-CM

## 2019-09-26 DIAGNOSIS — N1832 Chronic kidney disease, stage 3b: Secondary | ICD-10-CM

## 2019-09-26 DIAGNOSIS — Z794 Long term (current) use of insulin: Secondary | ICD-10-CM

## 2019-10-03 ENCOUNTER — Encounter (HOSPITAL_BASED_OUTPATIENT_CLINIC_OR_DEPARTMENT_OTHER): Payer: Self-pay | Admitting: Nephrology

## 2019-10-03 ENCOUNTER — Ambulatory Visit: Payer: No Typology Code available for payment source | Attending: Nephrology | Admitting: Nephrology

## 2019-10-03 VITALS — BP 172/98 | HR 51 | Temp 97.5°F | Ht 72.01 in | Wt 228.0 lb

## 2019-10-03 DIAGNOSIS — Z794 Long term (current) use of insulin: Secondary | ICD-10-CM | POA: Insufficient documentation

## 2019-10-03 DIAGNOSIS — E1122 Type 2 diabetes mellitus with diabetic chronic kidney disease: Secondary | ICD-10-CM

## 2019-10-03 DIAGNOSIS — N2889 Other specified disorders of kidney and ureter: Secondary | ICD-10-CM

## 2019-10-03 DIAGNOSIS — N1832 Chronic kidney disease, stage 3b: Secondary | ICD-10-CM | POA: Insufficient documentation

## 2019-10-03 DIAGNOSIS — I10 Essential (primary) hypertension: Secondary | ICD-10-CM | POA: Insufficient documentation

## 2019-10-03 DIAGNOSIS — E1121 Type 2 diabetes mellitus with diabetic nephropathy: Secondary | ICD-10-CM | POA: Insufficient documentation

## 2019-10-03 NOTE — Progress Notes (Signed)
NEPHROLOGY CLINIC FOLLOW UP NOTE  10/03/2019            REFERRING PHYSICIAN:   Morhaf Al Achkar     INITIAL CONSULT DATE: 05/28/2017      Past Medical History:   Diagnosis Date   . Cancer ([place])    . Diabetes mellitus ([place])    . Hypertension    . Kidney disease    . Lipidemia    . Lung disease    . Type II or unspecified type diabetes mellitus without mention of complication, not stated as uncontrolled      Past Surgical History:   Procedure Laterality Date   . left eye surgery x 5     . PR RPR INGUN HERNIA SLIDING ANY AGE      many years ago.  bilateral.        HISTORY OF PRESENT ILLNESS: Sean Oliver is a 59 year old male with past medical history significant for the above mentioned problems.    CC: Follow up on CKD and Hypertension     Based on verbal report, past medical history is significant for:    Diabetes mellitus of at least 15 years duration, though has been very well controlled over this period of time.  He was initially started off on insulin but subsequently has been switched to metformin only with good glycemic control.  By his report, he does not know of any end organ damage including absence of any diabetic retinopathy or neuropathy.  Also has long-standing hypertension of at least 20 years duration.  While he has not checked his pressures regularly at home, he reports that for the most part at doctors visits he was told that the blood pressure is only marginally elevated.  Prolonged time he was on combination lisinopril with hydrochlorothiazide, that was changed subsequently to alternative agents.  One reasoning for stopping lisinopril was to determine if it would lead to an improvement in creatinine/GFR.  However, over the last few months patient has been experiencing uncontrolled hypertension with most readings in the 160s-170s/90s-100 range.  He is not specifically expressing any symptoms related to these uncontrolled readings and denies any history of TIAs/CVAs.  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  Also had considerable issues with diverticulitis leading to laparotomy with limited resection as well in the past.    During the recent follow-ups with primary care physician and in effort to undertake a diagnostic evaluation for his chronic kidney disease, both urinary and imaging studies were undertaken.  Urinalyses now revealing increased albuminuria while imaging studies including ultrasound, CT scan and MRI also indicating around 2 cm left-sided interpolar mass most compatible with renal cell carcinoma.  He has not experienced any specific flank pains, dysuria, renal colic or hematuria.  Similarly no major change in urinary habit and no problems with dependent edema.    No history of nephrolithiasis, recurrent urinary tract infections or pyelonephritis.  Only occasional use of NSAIDs with no over-the-counter/herbal supplement use.    Interval History:  Overall history is significant for hospitalization between 2/1-07/19/2019 for hypoglycemic hyperosmolar syndrome (HHS) as well as accelerated hypertension.  Since that hospitalization is management has been modified with initiation of empagliflozin and insulin.  He is now also being closely followed by diabetes clinic.  For hypertension, lisinopril was previously switched to losartan on the complaints of a cough.  During the hospitalization his metoprolol was also continued at the same  dose while nifedipine dose was increased to 60 mg twice daily.     Since his discharge, unfortunately he has not been checking his blood pressures regularly.  Clinic readings checked twice this morning range from 160-170/80s-90s.  He is unsure whether he took his morning antihypertensives.  However, he is denying any headaches, visual changes or postural problems.  No history of dependent edema.    Since initiation of empagliflozin, he does not describe any significant change in his urinary  output.  Has not experienced any issues with urinary tract infections or other genital problems.    Previously encountered nocturia has dissipated now that he is using CPAP regularly.      Also is continuing to follow-up with urology for left renal lesion with regards to suspicion for renal cell carcinoma.  He is undergoing a MRI annually with last 1 in October 2020 with overall stable size of the renal lesion without any progression.  This is being closely followed by the urology clinic under the supervision of Dr. Jodi Mourning.      He has not experienced any dyspnea, chest pain, visual problems or dependent edema.          ALLERGIES:    Review of patient's allergies indicates:  No Known Allergies    HOME MEDICATIONS:      Current Outpatient Medications:   .  albuterol HFA 108 (90 Base) MCG/ACT inhaler, Inhale 2 puffs by mouth every 4 hours as needed for shortness of breath/wheezing., Disp: 1 Inhaler, Rfl: 11  .  atorvastatin 40 MG tablet, Take 1 tablet (40 mg) by mouth daily., Disp: 90 tablet, Rfl: 0  .  blood glucose test strip, 1 strip 3 times a day. Use to check blood sugar., Disp: 100 each, Rfl: 11  .  empagliflozin 25 MG tablet, Take 1 tablet (25 mg) by mouth daily., Disp: 90 tablet, Rfl: 1  .  fluticasone propionate 50 MCG/ACT nasal spray, Spray 2 sprays into each nostril daily., Disp: 16 g, Rfl: 6  .  glucometer (OneTouch Verio) kit, Use to monitor blood glucose., Disp: 1 each, Rfl: 0  .  insulin GLARGINE (Basaglar KwikPen) 100 UNIT/ML pen-injector, Inject 20 Units under the skin every morning., Disp: 18 mL, Rfl: 1  .  lancet (OneTouch Delica) 47W, Use 1 each 3 times a day., Disp: 100 each, Rfl: 11  .  losartan 50 MG tablet, Take 1 tablet (50 mg) by mouth daily., Disp: 90 tablet, Rfl: 3  .  metoprolol succinate ER 25 MG 24 hr tablet, Take 2 tablets (50 mg) by mouth daily. Do not chew or crush, Disp: 180 tablet, Rfl: 0  .  NIFEdipine ER 30 MG tablet extended-release 24 hour, Take 2 tablets (60 mg) by mouth 2  times a day., Disp: 180 tablet, Rfl: 1  .  NovoLOG FlexPen 100 UNIT/ML pen-injector, Inject 6-10 Units under the skin daily with dinner. 6u base dose plus 1u:50>150, Disp: 15 mL, Rfl: 6  .  sildenafil 100 MG tablet, Take 1 tablet by mouth daily as needed for erectile dysfunction. Don't take with other blood pressure meds., Disp: 30 tablet, Rfl: 0        PHYSICAL EXAM:     Vitals Signs: BP (!) 172/98   Pulse 51   Temp 36.4 C (Temporal)   Ht 6' 0.01" (1.829 m)   Wt (!) 103.4 kg (227 lb 15.3 oz)   SpO2 100%   BMI 30.91 kg/m .    Constitutional: Sitting comfortably in  chair, in no apparent distress.  Eyes:  Pupils normal size, EOM intact  Mouth/Throat: Mucous membranes are moist,  no oropharyngeal lesions  Neck: No JVD or carotid bruits  Lungs: No rhonchi or creptitus  Heart: S1, S2 with II/VI ESM, no rubs/ gallops  Abdomen: Soft, nontender; bowel sounds normal  Extremities: No pretibial or pedal edema,    Neuro: Alert O X3, no focal deficiits  Skin: No rash        DIAGNOSTIC STUDIES:    Results for KAIREE, ISA (MRN [id number]) as of 03/07/2019 16:18   04/19/2017 11:01 07/05/2017 16:47 09/16/2017 11:03 08/02/2018 11:06 03/07/2019 14:46 07/19/2019   Sodium 141 144 140 138 143 133   Potassium 3.8 3.9 3.7 3.9 4.0 3.9   Chloride 106 107 106 106 108 99   Carbon Dioxide, Total _0 Anion Gap _1 (L) 7    Glucose 102 117 238 (H) 159 (H) 136 (H) 332   Urea Nitrogen _2 (H) 17   Creatinine 1.61 (H) 1.51 (H) 1.49 (H) 1.54 (H) 1.62 (H) 1.55   GFR, Calc, African American 54 (L) 59 (L) 60 57 (L)     eGFR, Calculated     47 (L) 49   Calcium 9.2 9.6 9.4 9.0 9.2 9.1   Phosphate  3.5 2.8  2.9      Alb/Cr 345m/g (07/24/2019)      MRI 03/20/2019:  IMPRESSION:  Stable scan, showing stable left solid renal mass, likely very slow growing/indolent renal cell carcinoma, papillary type or possibly a benign tumor.      Stable hepatic T2 hyperintense focus, which is felt to represent benign given two years  salability.      No new suspicious lesions.       ASSESSMENT AND PLAN:    -Chronic Kidney Disease Stage 3A  Review of available labs since at least 2015 show a creatinine ranging from 1.27-1.61 (and one value obtained through [facility] of 1.442mdL).  This indicates long-standing element of chronic kidney disease with eGFR likely in the ~ 55 mL/minute range albeit a more or less stable course over this period of time with minimal progression.  Last serum creatinine at 1.55 with eGFR  4968minute, that indicates overall stable status without noticeable progression.  Albuminuria also somewhat improved down to 387m96mfrom previous 700mg88mtal proteinuria ~1g).  Continues on losartan with recent addition of empagliflozin.  Chronic kidney disease appears principally from hypertensive nephrosclerosis (corroborated by findings of LVH on ECHO as well), some parenchymal loss from cysts and possibly diabetic nephropathy.  Ultrasound of the kidneys is instructive in this regard indicating increased echogenicity from interstitial fibrosis.  Previously, a monoclonal disorder was ruled out with urine and serum electrophoresis studies as well as free light chain assays.      Needs continued aggressive risk factor modification (see below).    -Hypertension, Primary  Relatively uncontrolled clinic readings today.  Unfortunately patient does not check home readings.  Since last hospitalization in early February 2021, his regimen includes nifedipine XL 60 mg twice daily, metoprolol extended release 50 mg daily and losartan 50 mg daily.  I have instructed him to start checking his blood pressures twice daily for the next 1 week and report back to the clinic for additional antihypertensive modification.         -Diabetes mellitus type 2, uncontrolled  After course of relatively stable control of diabetes  mellitus, was hospitalized in early February 2021 with hypoglycemic hyperosmolar syndrome requiring initiation  of insulin.  Since that time he has also been started on empagliflozin.  Last hemoglobin A1c at 13%.    Continue losartan for maximum anti-proteinuric effect.        -Renal cell carcinoma   Imaging studies showing a 2 cm lesion in the interpolar region of the left kidney, likely renal cell carcinoma.  Is being actively followed in urology clinic where pros and cons of conservative versus surgical therapy were discussed.  On the basis of previous ultrasound and CT imaging and recent MRI 03/2019 showing overall stable size without any evidence of progression, urology clinic recommendation is to continue to pursue active surveillance only.    Next visit with Urology in early October 2020.             Johnathan Hausen, MD, 10/03/2019  Division of Nephrology  [facility]  [place], [place]

## 2019-10-03 NOTE — Patient Instructions (Signed)
Please check BP twice daily (first thing in the morning and before going to bed) for 1 week and report back:  You can either e-care message or call the Renal Nurse at [phone].    Based on the trend we can make additional modifications in your hypertensive profile.

## 2019-10-25 ENCOUNTER — Encounter (INDEPENDENT_AMBULATORY_CARE_PROVIDER_SITE_OTHER): Payer: No Typology Code available for payment source | Admitting: Unknown Physician Specialty

## 2019-10-31 ENCOUNTER — Other Ambulatory Visit (INDEPENDENT_AMBULATORY_CARE_PROVIDER_SITE_OTHER): Payer: Self-pay | Admitting: Family Medicine

## 2019-10-31 ENCOUNTER — Other Ambulatory Visit (INDEPENDENT_AMBULATORY_CARE_PROVIDER_SITE_OTHER): Payer: Self-pay | Admitting: Physician Assistant

## 2019-10-31 DIAGNOSIS — E785 Hyperlipidemia, unspecified: Secondary | ICD-10-CM

## 2019-10-31 DIAGNOSIS — I10 Essential (primary) hypertension: Secondary | ICD-10-CM

## 2019-11-01 MED ORDER — NIFEDIPINE ER 30 MG OR TB24
EXTENDED_RELEASE_TABLET | ORAL | 0 refills | Status: DC
Start: 2019-11-01 — End: 2020-01-23

## 2019-11-01 MED ORDER — ATORVASTATIN CALCIUM 40 MG OR TABS
40.0000 mg | ORAL_TABLET | Freq: Every day | ORAL | 0 refills | Status: DC
Start: 2019-11-01 — End: 2020-01-23

## 2019-11-01 MED ORDER — METOPROLOL SUCCINATE ER 25 MG OR TB24
50.0000 mg | EXTENDED_RELEASE_TABLET | Freq: Every day | ORAL | 0 refills | Status: DC
Start: 2019-11-01 — End: 2020-01-23

## 2019-11-01 NOTE — Telephone Encounter (Signed)
appt requested in another encounter

## 2019-11-01 NOTE — Telephone Encounter (Signed)
Due for follow-up.  Refill sent.  Please help patient schedule a follow-up visit.

## 2019-11-03 ENCOUNTER — Other Ambulatory Visit (INDEPENDENT_AMBULATORY_CARE_PROVIDER_SITE_OTHER): Payer: Self-pay | Admitting: Family Medicine

## 2019-11-03 DIAGNOSIS — N1832 Chronic kidney disease, stage 3b: Secondary | ICD-10-CM

## 2019-11-03 DIAGNOSIS — Z794 Long term (current) use of insulin: Secondary | ICD-10-CM

## 2019-11-03 DIAGNOSIS — E1122 Type 2 diabetes mellitus with diabetic chronic kidney disease: Secondary | ICD-10-CM

## 2019-11-03 NOTE — Telephone Encounter (Signed)
Not in current med list

## 2019-11-04 ENCOUNTER — Encounter (INDEPENDENT_AMBULATORY_CARE_PROVIDER_SITE_OTHER): Payer: Self-pay | Admitting: Family Medicine

## 2019-11-05 NOTE — Telephone Encounter (Signed)
Still open on our end. Please respond.

## 2019-11-06 MED ORDER — BD PEN NEEDLE NANO 2ND GEN 32G X 4 MM MISC
1.0000 | Freq: Four times a day (QID) | 3 refills | Status: DC
Start: 2019-11-06 — End: 2020-09-17

## 2019-11-06 NOTE — Telephone Encounter (Signed)
Routing to PCP as FYI.     Advised patient to schedule with any provider available.    Thanks!

## 2019-11-07 NOTE — Telephone Encounter (Signed)
Called patient and scheduled a medication follow up phone visit.

## 2019-11-08 ENCOUNTER — Other Ambulatory Visit: Payer: Self-pay

## 2019-11-08 ENCOUNTER — Ambulatory Visit: Payer: No Typology Code available for payment source | Attending: Dermatology | Admitting: Dermatology

## 2019-11-08 DIAGNOSIS — D492 Neoplasm of unspecified behavior of bone, soft tissue, and skin: Secondary | ICD-10-CM | POA: Insufficient documentation

## 2019-11-08 NOTE — Progress Notes (Signed)
If our office needs to contact you after your visit today, is it ok to leave a detailed message on your phone or e-care ?  YES      What is the preferred method of contact e-care/ phone  number?   Telephone Information:   Home Phone [phone]

## 2019-11-08 NOTE — Progress Notes (Signed)
[facility]  [address]  [place]  [address]  TEL: [phone]  l  FAX: [phone]      11/08/2019    PRIMARY CARE PROVIDER:  Matthew Saras, [place] [address]    PATIENT: Sean Oliver    [id number]    IDENTIFICATION/CHIEF CONCERN:    Sean Oliver is a 59 year old male return patient seen today for evaluation of a lesion on the left palmar hand.    Chief Complaint   Patient presents with   . Skin     left hand      DERMATOLOGY MEDICAL HISTORY:  Invasive SCC - left leg, excision Bx 10/23/14   Tinea pedis   Seborrheic keratoses   Onychomycosis  Pruritus     DERMATOLOGY MEDICATIONS AND TREATMENTS:   Ketoconazole 2% cream     HISTORY OF PRESENT ILLNESS:  Sean Oliver was last seen 03/28/2019 at which time the following plan was given regarding:  - Onychomycosis:   - Reviewed OTC treatment options such as antifungal topicals and oral antifungals. The patient is not bothered by this condition at this time.   - Pruritus:   - Reinforced dry skin care. Recommended to use thicker emollients (ointments or creams rather than lotions). Instructions and recommendations were provided in the AVS.    - Recommended OTC Sarna lotion with pramoxine. Information provided in AVS.   - H/o SCC:   - No evidence of recurrence, no treatment required.     Since the last visit he reports a lesion appearing on his left hand "out of the blue" a few months ago. Per the patient the lesion is a black blood blister that became hardened after it appeared. He tried to squeeze the lesion but nothing drained or came out of the lesion. He thinks it is now softer than it had previously been. It was white and flaky the other day. He denies any known injury to location. He denies itch or any associated symptoms. He has not tried any treatments to the area. Concerned that this could be the same thing as his SCC.    ROS:  Gen - Feels well, No  fevers/chills/recent illnesses/unintended weight loss/night sweats  Skin - No other skin complaints  Heme/lymph - No other new lumps or bumps under the skin  Respiratory - No cough, SOB, or CP    SOCIAL HISTORY:  Lives with wife and kids (triplets, born 2001). From [place]. History of sunburns on the scalp. He reports that he quit smoking about 3 years ago. His smoking use included cigars. He has never used smokeless tobacco. He reports current alcohol use. He reports that he does not use drugs.    PHYSICAL EXAMINATION:  Vital Signs: There were no vitals taken for this visit.  General: He is a well appearing adult male in no acute distress, who has a normal build. He is pleasant with an appropriate mood and affect without barriers to understanding.  Skin: Focused exam of left palm. Exam was remarkable for:    L ulnar palm - 1 cm ovoid hyperkeratotic hyperpigmented thin plaque with punctate hemorrhages but, dermatoglyphs remain i n tact (biopsy - A)    IMPRESSION/PLAN:  The following was discussed with the patient.  Recommendations are as follows:    1. Neoplasm of unspecified nature of bone, soft tissue, and skin (  biopsy)  The patient endorses a lesion appearing on his left hand without any known trigger. No treatments were tried. The lesion was pared down and then visualized. On exam there is a 1 cm ovoid hyperkeratotic hyperpigmented thin plaque with dermatoglyphs in tact on the left ulnar palm. I favor that it is a wart, though it would be an unusual wart presentation for the dermatoglyphs to be present if this lesion were to be a wart. Lichen simplex chronicus and SCC are in the ddx.  Considering his history of SCC, as well as its unspecific etiology, it should be biopsied.   - Biopsy offered today, patient accepts. DDx: wart vs callus/LSC vs r/o SCC.  - potential side effects of biopsy extensively discussed due to sensitive area  - PATHOLOGY, SURGICAL    Return to clinic pending biopsy results or sooner PRN acute  concerns.    Punch Biopsy     Indication: Neoplasm of uncertain behavior of bone, soft tissue, and skin    Site(s): L ulnar palm  Prep: Wiped with isopropyl alcohol  Anesthesia: 1% lidocaine with epinephrine 1:200,000     Description of procedure:     The risk of scarring, bleeding, infection, pain, nerve damage and the benefit of obtaining a potential diagnosis were explained to the patient. The patient agreed to the procedure after being informed of the risks and benefits. Signed consent on file. After prepping the skin and with anesthesia, a 4 mm punch biopsy instrument was used to obtain the specimen. Specimen observed to be in the container correctly labeled with the patient's name and sent to the lab. Hemostasis was obtained using non-absorbing suture. Gel foam applied to the wound. Sterile dressing applied over white petrolatum. There were no complications and the patient tolerated the procedure well. Patient was given wound care instructions.      Patient was told to have sutures removed in 10-14 days.    11/08/2019 @ 6:00 PM - I, Frann Rider acted as a Education administrator and documented the service/procedure performed to the best of my knowledge in the presence of Karlyne Greenspan, MD who will provide the final review and authentication.  Signed: Oren Beckmann, MD, personally performed the services described in this documentation, as scribed by Frann Rider  in my presence, and it is both accurate and complete.

## 2019-11-08 NOTE — Patient Instructions (Signed)
[facility]    Skin Care After Your Procedure     What to Do      Keep the dressing DRY. Leave it in place for 24 hours.       Every day:   Check the wound for any changes (see "When to Call" below).   Gently clean the area with warm, slightly soapy water. Rinse well and gently pat dry.   After cleaning, apply Vaseline or Aquaphor to the area. Do not use antibiotic ointments.   Keep covering the area with a dressing until it is healed and any sutures have been removed. Change to a smaller dressing as you can.    We will not put dressing over procedure areas where you have thick hair growth. Apply Vaseline or Aquaphor to these areas every day. Be very careful when combing, brushing, or shaving in the procedure areas that are not covered by the dressing.      You may take short showers or baths, no longer than 15 minutes. Avoid soaking your wound in water.     When to Call     Some symptoms do not require a call to the clinic. You can expect to see:       A small amount of blood on the dressing   A flat bruise around the area (this will slowly fade)    Call one of the numbers under "Who to Call" below if:      Blood is soaking through the dressing or you need to change the dressing many times a day. If active bleeding occurs, apply firm pressure for 10 to 15 minutes and call the clinic.   You have a lot of bruising or a lump forms in the area.   Redness is spreading out from the wound.   Pain is getting worse.   The procedure area is warm to the touch.   You have yellow or green (purulent) drainage from the procedure area.   You have a fever higher than 101F (38.3C).   You have nausea or vomiting, or both.     Who to Call      Weekdays from 8 a.m. to 5 p.m., call [phone]    After hours and on weekends and holidays, call [phone] and ask to speak with the dermatologist on call.     Follow-up     If you have sutures, they are  usually removed in the clinic. You will receive instructions about suture removal at the time of your procedure. Most times, suture removal is done:      1 week if your procedure was on your head or neck    2 weeks if your procedure was on your body, arms, or legs      Results    We will give you your results by phone, mail, eCare, or during a follow-up visit. If you do not receive your results within 3 weeks, please call the clinic and leave a message for clinic staff.

## 2019-11-09 DIAGNOSIS — R233 Spontaneous ecchymoses: Secondary | ICD-10-CM

## 2019-11-09 DIAGNOSIS — I788 Other diseases of capillaries: Secondary | ICD-10-CM

## 2019-11-09 DIAGNOSIS — L859 Epidermal thickening, unspecified: Secondary | ICD-10-CM

## 2019-11-09 DIAGNOSIS — L83 Acanthosis nigricans: Secondary | ICD-10-CM

## 2019-11-10 LAB — PATHOLOGY, SURGICAL: Clinical Information: 56

## 2019-11-10 NOTE — Telephone Encounter (Addendum)
Called patient to schedule appointment for his ankle pain.  Patient said he needed to cancel the 12/14/19 medication follow-up due to a new job.  Also states his ankle is better.  He will contact us to reschedule if the ankle gets worse and will reschedule his medication follow-up via eCare when he has a better idea of his work schedule.    Closing TE

## 2019-11-10 NOTE — Result Encounter Note (Signed)
Patient contacted with results. Discussed that there was no cancer. Most consistent with callus/frictional trauma. Discussed minimizing irritation/pressure in that area. He likes to golf so this may be contributory.

## 2019-12-03 ENCOUNTER — Other Ambulatory Visit (INDEPENDENT_AMBULATORY_CARE_PROVIDER_SITE_OTHER): Payer: Self-pay | Admitting: Family Medicine

## 2019-12-03 DIAGNOSIS — N529 Male erectile dysfunction, unspecified: Secondary | ICD-10-CM

## 2019-12-04 NOTE — Telephone Encounter (Signed)
Refill request for sildenafil requires your authorization because double checking dirrections    If you want to authorize it, please indicate # of refills, sign the order and close the encounter.    If the directions are to be changed, please do so and send

## 2019-12-05 MED ORDER — SILDENAFIL CITRATE 100 MG OR TABS
100.0000 mg | ORAL_TABLET | Freq: Every day | ORAL | 6 refills | Status: DC | PRN
Start: 2019-12-05 — End: 2020-12-13

## 2019-12-14 ENCOUNTER — Telehealth (INDEPENDENT_AMBULATORY_CARE_PROVIDER_SITE_OTHER): Payer: No Typology Code available for payment source | Admitting: Family Medicine

## 2019-12-26 ENCOUNTER — Encounter (HOSPITAL_BASED_OUTPATIENT_CLINIC_OR_DEPARTMENT_OTHER): Payer: No Typology Code available for payment source | Admitting: Dermatology

## 2020-01-16 ENCOUNTER — Encounter (INDEPENDENT_AMBULATORY_CARE_PROVIDER_SITE_OTHER): Payer: Self-pay

## 2020-01-22 ENCOUNTER — Other Ambulatory Visit (INDEPENDENT_AMBULATORY_CARE_PROVIDER_SITE_OTHER): Payer: Self-pay | Admitting: Family Medicine

## 2020-01-22 DIAGNOSIS — E785 Hyperlipidemia, unspecified: Secondary | ICD-10-CM

## 2020-01-22 DIAGNOSIS — I10 Essential (primary) hypertension: Secondary | ICD-10-CM

## 2020-01-23 MED ORDER — METOPROLOL SUCCINATE ER 25 MG OR TB24
50.0000 mg | EXTENDED_RELEASE_TABLET | Freq: Every day | ORAL | 0 refills | Status: DC
Start: 2020-01-23 — End: 2020-02-20

## 2020-01-23 MED ORDER — ATORVASTATIN CALCIUM 40 MG OR TABS
ORAL_TABLET | ORAL | 0 refills | Status: DC
Start: 2020-01-23 — End: 2020-02-20

## 2020-01-23 MED ORDER — NIFEDIPINE ER 30 MG OR TB24
EXTENDED_RELEASE_TABLET | ORAL | 0 refills | Status: DC
Start: 2020-01-23 — End: 2020-02-20

## 2020-01-23 NOTE — Telephone Encounter (Signed)
Patient is due for an appointment

## 2020-01-25 NOTE — Telephone Encounter (Signed)
Patient has been scheduled for 10/4. Closing.

## 2020-02-13 ENCOUNTER — Other Ambulatory Visit (HOSPITAL_BASED_OUTPATIENT_CLINIC_OR_DEPARTMENT_OTHER): Payer: Self-pay | Admitting: Family

## 2020-02-13 DIAGNOSIS — Z794 Long term (current) use of insulin: Secondary | ICD-10-CM

## 2020-02-13 DIAGNOSIS — E1122 Type 2 diabetes mellitus with diabetic chronic kidney disease: Secondary | ICD-10-CM

## 2020-02-13 DIAGNOSIS — N1832 Chronic kidney disease, stage 3b: Secondary | ICD-10-CM

## 2020-02-13 NOTE — Telephone Encounter (Signed)
Patient is out of his insulin and needs the clinic to send the RX to his Pharmacy ASAP.

## 2020-02-14 MED ORDER — BASAGLAR KWIKPEN 100 UNIT/ML SC SOPN
PEN_INJECTOR | SUBCUTANEOUS | 0 refills | Status: DC
Start: 2020-02-14 — End: 2020-04-11

## 2020-02-14 NOTE — Telephone Encounter (Signed)
Rx and pharmacy pended for provider review.     Routing to provider to please advise on Rx refill request. -Thanks!

## 2020-02-18 ENCOUNTER — Other Ambulatory Visit (HOSPITAL_BASED_OUTPATIENT_CLINIC_OR_DEPARTMENT_OTHER): Payer: Self-pay | Admitting: Family

## 2020-02-18 ENCOUNTER — Other Ambulatory Visit (INDEPENDENT_AMBULATORY_CARE_PROVIDER_SITE_OTHER): Payer: Self-pay | Admitting: Family Medicine

## 2020-02-18 DIAGNOSIS — E1122 Type 2 diabetes mellitus with diabetic chronic kidney disease: Secondary | ICD-10-CM

## 2020-02-18 DIAGNOSIS — I10 Essential (primary) hypertension: Secondary | ICD-10-CM

## 2020-02-18 DIAGNOSIS — E785 Hyperlipidemia, unspecified: Secondary | ICD-10-CM

## 2020-02-18 DIAGNOSIS — Z794 Long term (current) use of insulin: Secondary | ICD-10-CM

## 2020-02-18 DIAGNOSIS — N1832 Chronic kidney disease, stage 3b: Secondary | ICD-10-CM

## 2020-02-20 MED ORDER — METOPROLOL SUCCINATE ER 25 MG OR TB24
50.0000 mg | EXTENDED_RELEASE_TABLET | Freq: Every day | ORAL | 1 refills | Status: DC
Start: 2020-02-20 — End: 2020-04-17

## 2020-02-20 MED ORDER — NIFEDIPINE ER 30 MG OR TB24
EXTENDED_RELEASE_TABLET | ORAL | 1 refills | Status: DC
Start: 2020-02-20 — End: 2020-04-17

## 2020-02-20 MED ORDER — ATORVASTATIN CALCIUM 40 MG OR TABS
ORAL_TABLET | ORAL | 1 refills | Status: DC
Start: 2020-02-20 — End: 2020-04-17

## 2020-02-20 MED ORDER — JARDIANCE 25 MG OR TABS
ORAL_TABLET | ORAL | 0 refills | Status: DC
Start: 2020-02-20 — End: 2020-05-16

## 2020-03-18 ENCOUNTER — Ambulatory Visit (INDEPENDENT_AMBULATORY_CARE_PROVIDER_SITE_OTHER): Payer: No Typology Code available for payment source | Admitting: Family Medicine

## 2020-03-18 ENCOUNTER — Encounter (INDEPENDENT_AMBULATORY_CARE_PROVIDER_SITE_OTHER): Payer: Self-pay | Admitting: Family Medicine

## 2020-03-18 VITALS — BP 152/94 | HR 64 | Temp 98.0°F | Resp 14 | Wt 225.0 lb

## 2020-03-18 DIAGNOSIS — Z5321 Procedure and treatment not carried out due to patient leaving prior to being seen by health care provider: Secondary | ICD-10-CM

## 2020-03-18 DIAGNOSIS — N1832 Chronic kidney disease, stage 3b: Secondary | ICD-10-CM

## 2020-03-18 DIAGNOSIS — Z794 Long term (current) use of insulin: Secondary | ICD-10-CM

## 2020-03-18 DIAGNOSIS — E1122 Type 2 diabetes mellitus with diabetic chronic kidney disease: Secondary | ICD-10-CM

## 2020-03-18 LAB — A1C RAPID, ONSITE: HEMOGLOBIN A1C, ONSITE: 6.4 % — ABNORMAL HIGH (ref 4.0–6.0)

## 2020-03-18 NOTE — Progress Notes (Signed)
DMII previously seen by endo last in March, they recommended f/u with [place] but no visit until today. Patient apparently left without being seen today. Called but couldn't reach. Lori to reach out. Have ordered labs and will try to reschedule.

## 2020-03-19 ENCOUNTER — Telehealth (INDEPENDENT_AMBULATORY_CARE_PROVIDER_SITE_OTHER): Payer: Self-pay

## 2020-03-19 LAB — BASIC METABOLIC PANEL
Anion Gap: 8 (ref 4–12)
Calcium: 9.5 mg/dL (ref 8.9–10.2)
Carbon Dioxide, Total: 28 meq/L (ref 22–32)
Chloride: 106 meq/L (ref 98–108)
Creatinine: 1.62 mg/dL — ABNORMAL HIGH (ref 0.51–1.18)
Glucose: 99 mg/dL (ref 62–125)
Potassium: 4.3 meq/L (ref 3.6–5.2)
Sodium: 142 meq/L (ref 135–145)
Urea Nitrogen: 23 mg/dL — ABNORMAL HIGH (ref 8–21)
eGFR by CKD-EPI: 46 mL/min/{1.73_m2} — ABNORMAL LOW (ref >59)

## 2020-03-19 NOTE — Telephone Encounter (Signed)
Pt referred by Dr Lavena Bullion via Danna Hefty, [place] for assistance with his DM  (A1c13.3  07/17/19)  Chart review - last endo visit pt was doing very well.  Appt with Lavena Bullion yesterday 10/4 - pt left before being seen, but he got his labs drawn.  A1c 6.4    Calling pt to reschedule appt with Dr Lavena Bullion.  Rescheduled for TM with Dr Lavena Bullion for 10/7 at 2pm.  He declined working with Care Manager at this time, with his A1c 6.4      Route to Dr Lavena Bullion

## 2020-03-21 ENCOUNTER — Telehealth (INDEPENDENT_AMBULATORY_CARE_PROVIDER_SITE_OTHER): Payer: No Typology Code available for payment source | Admitting: Family Medicine

## 2020-03-21 DIAGNOSIS — Z85828 Personal history of other malignant neoplasm of skin: Secondary | ICD-10-CM

## 2020-03-21 DIAGNOSIS — N2889 Other specified disorders of kidney and ureter: Secondary | ICD-10-CM

## 2020-03-21 DIAGNOSIS — N1832 Chronic kidney disease, stage 3b: Secondary | ICD-10-CM

## 2020-03-21 DIAGNOSIS — E1122 Type 2 diabetes mellitus with diabetic chronic kidney disease: Secondary | ICD-10-CM

## 2020-03-21 DIAGNOSIS — I10 Essential (primary) hypertension: Secondary | ICD-10-CM

## 2020-03-21 DIAGNOSIS — Z1211 Encounter for screening for malignant neoplasm of colon: Secondary | ICD-10-CM

## 2020-03-21 DIAGNOSIS — Z794 Long term (current) use of insulin: Secondary | ICD-10-CM

## 2020-03-21 DIAGNOSIS — Z0000 Encounter for general adult medical examination without abnormal findings: Secondary | ICD-10-CM

## 2020-03-21 NOTE — Progress Notes (Signed)
Chief Complaint   Patient presents with   . Diabetes     This visit is being conducted over the telephone at the patient's request: Yes  Patient gives verbal consent to proceed and knows there may be a copay/deductible: Yes    Time spent with patient/guardian on this telephone visit: 22 minutes    Given the importance of social distancing and other strategies recommended to reduce the risk of COVID-19 transmission, I am providing medical care to this patient via a telephone visit in place of an in person visit at the request of the patient.    Subjective:     Sean Oliver is a 59 year old male who presents on 03/21/2020 for the following concerns:     DMII: A1C 6.4. Current meds are glargine 20 units in morning depending on carbs, jardiance 25mg  daily. cabsStates that had eye exam    HTN: got BP cuff. Has checked about 2 times. Does not know what numbers has measured.    History of SCC: due for derm visit.   Likely RCC: due for urology visit.   CRC screening: states that had colonoscopy a few years ago but no records.    ROS    Objective:   Vitals: There were no vitals taken for this visit.   Physical Exam    Assessment and Plan:   1. Type 2 diabetes mellitus with stage 3b chronic kidney disease, with long-term current use of insulin ([place])  Currently well controlled. We will continue current regimen and repeat A1c in 3 months given recent history of HHS.    2. Essential hypertension  Just obtained blood pressure cuff. Goal blood pressure is less than 140/90 given diabetes and CKD. Recommended setting a visit in our clinic to check cuffs accuracy and get blood pressure readings for my review.    3. History of squamous cell carcinoma of skin  Encouraged setting up dermatology appointment for full body skin exam.    4. Kidney mass  Encouraged setting up urology appointment for continued mass surveillance.    5. Stage 3b chronic kidney disease ([place])  Stable and followed by nephrology. On  SGLT2i.    6. Colon cancer screening  No colon cancer screening available in chart. Finnlee will check with his wife about records and let me know if has had. If not, will arrange FIT versus colonoscopy.    7. Healthcare maintenance  Up-to-date on flu and Covid vaccines.    Follow-up: 3 mo for HTN, DMII f/u (A1C ordered)    Matthew Saras, MD  [facility]  [place] [address]  [phone]

## 2020-03-28 LAB — C-PEPTIDE: C-Peptide: 3.6 ng/mL (ref 1.1–4.4)

## 2020-04-02 ENCOUNTER — Other Ambulatory Visit: Payer: Self-pay

## 2020-04-03 NOTE — Result Encounter Note (Signed)
Released via ecare, see message from today

## 2020-04-04 ENCOUNTER — Encounter (INDEPENDENT_AMBULATORY_CARE_PROVIDER_SITE_OTHER): Payer: Self-pay

## 2020-04-11 ENCOUNTER — Other Ambulatory Visit (HOSPITAL_BASED_OUTPATIENT_CLINIC_OR_DEPARTMENT_OTHER): Payer: Self-pay | Admitting: Family Medicine

## 2020-04-11 DIAGNOSIS — E1122 Type 2 diabetes mellitus with diabetic chronic kidney disease: Secondary | ICD-10-CM

## 2020-04-11 DIAGNOSIS — N1832 Chronic kidney disease, stage 3b: Secondary | ICD-10-CM

## 2020-04-11 DIAGNOSIS — Z794 Long term (current) use of insulin: Secondary | ICD-10-CM

## 2020-04-12 MED ORDER — BASAGLAR KWIKPEN 100 UNIT/ML SC SOPN
PEN_INJECTOR | SUBCUTANEOUS | 2 refills | Status: DC
Start: 2020-04-12 — End: 2020-04-12

## 2020-04-12 MED ORDER — BASAGLAR KWIKPEN 100 UNIT/ML SC SOPN
PEN_INJECTOR | SUBCUTANEOUS | 3 refills | Status: DC
Start: 2020-04-12 — End: 2022-03-12

## 2020-04-16 ENCOUNTER — Other Ambulatory Visit (INDEPENDENT_AMBULATORY_CARE_PROVIDER_SITE_OTHER): Payer: Self-pay | Admitting: Family Medicine

## 2020-04-16 DIAGNOSIS — I10 Essential (primary) hypertension: Secondary | ICD-10-CM

## 2020-04-16 DIAGNOSIS — E785 Hyperlipidemia, unspecified: Secondary | ICD-10-CM

## 2020-04-17 MED ORDER — ATORVASTATIN CALCIUM 40 MG OR TABS
ORAL_TABLET | ORAL | 3 refills | Status: DC
Start: 2020-04-17 — End: 2021-05-05

## 2020-04-17 MED ORDER — METOPROLOL SUCCINATE ER 25 MG OR TB24
EXTENDED_RELEASE_TABLET | ORAL | 3 refills | Status: DC
Start: 2020-04-17 — End: 2021-05-05

## 2020-04-17 MED ORDER — LOSARTAN POTASSIUM 50 MG OR TABS
50.0000 mg | ORAL_TABLET | Freq: Every day | ORAL | 3 refills | Status: DC
Start: 2020-04-17 — End: 2021-05-05

## 2020-04-17 MED ORDER — NIFEDIPINE ER 30 MG OR TB24
EXTENDED_RELEASE_TABLET | ORAL | 3 refills | Status: DC
Start: 2020-04-17 — End: 2021-05-12

## 2020-04-17 NOTE — Telephone Encounter (Signed)
This medication is outside of the [facility]'s protocols. Please sign and close the encounter if you approve:     Atorvastatin- no recent lipid panel, last 04/07/17  No future appt pending    If this medication is denied please have your staff inform the patient and schedule an appointment if necessary.

## 2020-05-15 ENCOUNTER — Other Ambulatory Visit (INDEPENDENT_AMBULATORY_CARE_PROVIDER_SITE_OTHER): Payer: Self-pay | Admitting: Family Medicine

## 2020-05-15 DIAGNOSIS — N1832 Chronic kidney disease, stage 3b: Secondary | ICD-10-CM

## 2020-05-15 DIAGNOSIS — E1122 Type 2 diabetes mellitus with diabetic chronic kidney disease: Secondary | ICD-10-CM

## 2020-05-15 DIAGNOSIS — Z794 Long term (current) use of insulin: Secondary | ICD-10-CM

## 2020-05-16 MED ORDER — JARDIANCE 25 MG OR TABS
ORAL_TABLET | ORAL | 0 refills | Status: DC
Start: 2020-05-16 — End: 2020-09-16

## 2020-05-16 NOTE — Telephone Encounter (Signed)
Per last visit note  Assessment and Plan:   1. Type 2 diabetes mellitus with stage 3b chronic kidney disease, with long-term current use of insulin ([place])  Currently well controlled. We will continue current regimen and repeat A1c in 3 months given recent history of HHS.

## 2020-05-17 ENCOUNTER — Telehealth (INDEPENDENT_AMBULATORY_CARE_PROVIDER_SITE_OTHER): Payer: Self-pay | Admitting: Pharmacy Technician

## 2020-05-17 NOTE — Telephone Encounter (Signed)
Please see PA request attached.

## 2020-05-21 ENCOUNTER — Telehealth (INDEPENDENT_AMBULATORY_CARE_PROVIDER_SITE_OTHER): Payer: Self-pay

## 2020-05-21 DIAGNOSIS — Z1211 Encounter for screening for malignant neoplasm of colon: Secondary | ICD-10-CM

## 2020-05-21 NOTE — Telephone Encounter (Signed)
Begin outreach; PT due for eye exam; repeat a1c in January and colorectal cancer screening    *callfor pt location preference  *offer fit   *schedule follow-up w/repeat a1c

## 2020-05-23 NOTE — Telephone Encounter (Signed)
PT in [place] till Monday and asked for me to call back for scheduling in a week    postponed

## 2020-05-24 NOTE — Telephone Encounter (Addendum)
Received a PA request from [facility] [place] [phone]  for Jardiance 25mg . After processing a test claim, this does not require a PA through the patient's current plan, [place]. No PA is needed. No further action taken/needed by [facility] at this time.

## 2020-05-27 ENCOUNTER — Other Ambulatory Visit (HOSPITAL_BASED_OUTPATIENT_CLINIC_OR_DEPARTMENT_OTHER): Payer: Self-pay

## 2020-05-30 NOTE — Telephone Encounter (Signed)
Scheduled DM f/u with Gerald Stabs. Patient wants FIT sent and he will bring back in for drop off at his appt    Routing to PCP to please advise. -Thanks!

## 2020-06-10 ENCOUNTER — Telehealth (INDEPENDENT_AMBULATORY_CARE_PROVIDER_SITE_OTHER): Payer: Self-pay

## 2020-06-10 NOTE — Telephone Encounter (Signed)
Called the patient and asked if he needed hep scheduling his appointments. He advised he had been out of town and would get on it now & thanked me for the reminder

## 2020-06-10 NOTE — Telephone Encounter (Signed)
-----   Message from Matthew Saras, MD sent at 06/07/2020 12:45 AM PST -----  Regarding: speciality vistis  Hi Danna Hefty,     I'm not sure if you can help with this, but this patient is due for follow-up with derm, urology and nephrology. Is there anything we can do to help him get these appointments scheduled?    Thanks!  [place]

## 2020-06-10 NOTE — Telephone Encounter (Signed)
Outreach to patient and remind him -hat he is due for follow-up with derm, urology and nephrology

## 2020-06-17 ENCOUNTER — Encounter (INDEPENDENT_AMBULATORY_CARE_PROVIDER_SITE_OTHER): Payer: No Typology Code available for payment source | Admitting: Physician Assistant

## 2020-06-17 NOTE — Progress Notes (Deleted)
a1 

## 2020-07-02 ENCOUNTER — Other Ambulatory Visit (INDEPENDENT_AMBULATORY_CARE_PROVIDER_SITE_OTHER): Payer: Self-pay | Admitting: Family Medicine

## 2020-07-03 NOTE — Telephone Encounter (Signed)
Patient has scheduled appointments in march for dermatology and urology    Routing to PCP as FYI. -Thanks!

## 2020-07-16 ENCOUNTER — Ambulatory Visit (INDEPENDENT_AMBULATORY_CARE_PROVIDER_SITE_OTHER): Payer: No Typology Code available for payment source | Admitting: Physician Assistant

## 2020-07-16 VITALS — BP 157/82 | HR 61 | Temp 98.5°F | Resp 16 | Wt 226.0 lb

## 2020-07-16 DIAGNOSIS — Z794 Long term (current) use of insulin: Secondary | ICD-10-CM

## 2020-07-16 DIAGNOSIS — I10 Essential (primary) hypertension: Secondary | ICD-10-CM

## 2020-07-16 DIAGNOSIS — E1122 Type 2 diabetes mellitus with diabetic chronic kidney disease: Secondary | ICD-10-CM

## 2020-07-16 DIAGNOSIS — N1832 Chronic kidney disease, stage 3b: Secondary | ICD-10-CM

## 2020-07-16 LAB — URINALYSIS COMPLETE, URN
Bacteria, URN: NONE SEEN
Bilirubin (Qual), URN: NEGATIVE
Epith Cells_Renal/Trans,URN: NEGATIVE /HPF
Ketones, URN: NEGATIVE mg/dL
Leukocyte Esterase, URN: NEGATIVE
Nitrite, URN: NEGATIVE
RBC, URN: NEGATIVE /HPF
Specific Gravity, URN: 1.027 g/mL (ref 1.006–1.027)
WBC, URN: NEGATIVE /HPF
pH, URN: 6 (ref 5.0–8.0)

## 2020-07-16 LAB — BASIC METABOLIC PANEL
Anion Gap: 7 (ref 4–12)
Calcium: 9.1 mg/dL (ref 8.9–10.2)
Carbon Dioxide, Total: 28 meq/L (ref 22–32)
Chloride: 106 meq/L (ref 98–108)
Creatinine: 1.49 mg/dL — ABNORMAL HIGH (ref 0.51–1.18)
Glucose: 207 mg/dL — ABNORMAL HIGH (ref 62–125)
Potassium: 4.1 meq/L (ref 3.6–5.2)
Sodium: 141 meq/L (ref 135–145)
Urea Nitrogen: 16 mg/dL (ref 8–21)
eGFR by CKD-EPI: 51 mL/min/{1.73_m2} — ABNORMAL LOW (ref >59)

## 2020-07-16 LAB — A1C RAPID, ONSITE: HEMOGLOBIN A1C, ONSITE: 7.1 % — ABNORMAL HIGH (ref 4.0–6.0)

## 2020-07-16 LAB — ALBUMIN/CREATININE RATIO, RANDOM URINE
Albumin (Micro), URN: 47.72 mg/dL
Albumin/Creatinine Ratio, URN: 682 mg/g{creat} — ABNORMAL HIGH (ref <30)
Creatinine/Unit, URN: 70 mg/dL

## 2020-07-16 NOTE — Progress Notes (Signed)
DIABETIC FOOT SCREEN     Right Foot Left Foot   Exam findings . n/a   Weakness in the ankle or foot: NO YES - Pain inner side of foot   Nails thick, too long, or ingrown: YES YES   Brachial Systolic Pressure:     Ankle Systolic Pressure:     Ischemic Index:     Does patient use appropriate footware? YES YES   Risk Category:* 0 0       *Risk Category  0 - No loss of protective sensation   1 - Loss of protective sensation (no weakness, deformity, callus, pre-ulcer or callus, but no hx of ulceration)   2 - Loss of protective sensation with  weakness, deformity, callus, pre-ulcer or callus, but no hx of ulceration   3 - History of plantar ulceration     Exam performed by Darlyn Read, CMA

## 2020-07-16 NOTE — Patient Instructions (Addendum)
We should consider restarting Metformin.  You can discuss this with your kidney doctor.    Please restart checking your home blood pressures.   Our goal for you is <130/80, few <5% <140/90.     If your home BP readings are also higher than normal, let us know by message. In that case, I would recommend increasing your Losartan to 100mg  daily.

## 2020-07-16 NOTE — Progress Notes (Signed)
[facility] OUT[facility]IENT [facility]IC NOTE    Subjective:     Chief Complaint   Patient presents with   . Diabetes     Sean Oliver is a 60 year old male who presents on 07/16/2020 for the following concerns:    # F/U T2DM   Not checking BS regularly   Last week FBS 127   Taking insulin glargine 20u QD + Jardiance 64m QD.    He is very much wanting to d/c insulin and return to metformin monotherapy (regimen prior to his 2021 hospitalization).    Reports weight gain in interim.    DIET: gatorade ZERO and water, has had more sweets lately he endorses   Has optometry visit scheduled upcoming, is unsure of exact location (~[address]) but plans to ask for visit note to be sent here   On ARB, statin   Denies polyuria, polydipsia, polyuria.    H/o CKD3    # F/U HTN   Home BP machine not working that seems inaccurate, got new machine but not started using it   Reports ~2 weeks ago had reading at fire dept but is unsure of number, states it was "little high" then   Taking losartan, nifedipine, metoprolol as prescribed.    Denies vision change, CP, palpitations, orthopnea, LE edema.     Interpreter needed? NO    I reviewed the patient's recorded medical history, and confirmed the medications, problem list, and allergies with the patient.     ROS: Per HPI    Objective:   BP (!) 157/82   Pulse 61   Temp 36.9 C (Temporal)   Resp 16   Wt (!) 102.5 kg (226 lb)   SpO2 98%   BMI 30.64 kg/m   GEN: No acute distress, alert.   HEENT: Normocephalic, atraumatic. MMM/AS.   CV: RRR, S1S2 without m/r/g. No pitting edema.   CHEST: Nml respiratory rate and effort. LCTAB without r/r/w.  NEURO: No gross cranial nerve or mental status deficits.   PSYCH: Alert and oriented. Making good eye contact with coherent speech, thought process. Normal mood and affect.  MSK: Ambulating w/o assistance. No gross range of motion deficits.    DATA:  Office Visit on 07/16/20   1. POC Whole Blood A1C   Result Value Ref Range    HEMOGLOBIN A1C,  ONSITE 7.1 (H) 4.0 - 6.0 %     Assessment and Plan:   # 60year old male with:  1. Type 2 diabetes mellitus with stage 3b chronic kidney disease, with long-term current use of insulin (HCC)  Taking insulin glargine 20u QD and Jardiance 285mQD. Reports eating more sweets but no other significant diet change. Has appt to updated eye exam and will send report when done.  Nml foot exam today. A1c increased at 7.1% today. Declined restart metformin today. He is ultimately wanting to d/c insulin, and we discussed strategies for restarting oral anti-diabetics and tapering insulin based on FBS. He declined this approach citing he would like to completely d/c insulin. I offered referral to RN DM mgmt services that could assist with this change and maintaining glycemic control during that time, but he declined this. He would like to talk with his nephrologist prior to making any of these changes and has f/u scheduled, on waitlist. He agreed to make attempts reduce intake of sweets.   - Basic Metabolic Panel; Future  - POC Whole Blood A1P3I- Basic Metabolic Panel  - Albumin, Random Urine; Future  -  Urinalysis Complete, Urine; Future  - Urinalysis Complete, Urine  - Albumin, Random Urine    2. Essential hypertension  Taking ARB, CCB, BB as prescribed. Elevated in clinic today. He reports a reading ~1-2 weeks ago at fire dept that was "high" but is unclear of specific values. Benign cardiopulmomary exam today. I recommended increasing his ARB today which he declined. Asked he restart home BP readings with his new machine and message/RTC for persistently elevated readings >130/80.     Pt education: Discussed goal A1c and BP values for person with CKD + goal directed medical therapies which he is taking currently.     Follow-up: 1 month for recheck BP and home BS    I spent a total of 40 minutes for the patient's care on the date of the service.    Rhetta Mura, PA-C  [facility] AT  [place] [address]  [phone]      Active Ambulatory Problems     Diagnosis Date Noted   . Type 2 diabetes mellitus with stage 3b chronic kidney disease, with long-term current use of insulin ([place])    . History of squamous cell carcinoma of skin 04/02/2015   . Marijuana abuse 03/03/2016   . Mediastinal lymphadenopathy 03/03/2016   . Pulmonary nodule, right 03/03/2016   . Essential hypertension 04/07/2017   . CKD (chronic kidney disease) stage 3, GFR 30-59 ml/min (HCC) 04/26/2017   . Proteinuria 04/26/2017   . Other hyperlipidemia 04/26/2017   . OSA (obstructive sleep apnea) 08/02/2018   . Kidney mass 10/03/2019     Resolved Ambulatory Problems     Diagnosis Date Noted   . Renal cell carcinoma of left kidney ([place]) 09/16/2017     Past Medical History:   Diagnosis Date   . Cancer ([place])    . Diabetes mellitus ([place])    . Hypertension    . Kidney disease    . Lipidemia    . Lung disease    . Type II or unspecified type diabetes mellitus without mention of complication, not stated as uncontrolled        Patient's Medications   New Prescriptions    No medications on file   Previous Medications    ALBUTEROL HFA 108 (90 BASE) MCG/ACT INHALER    Inhale 2 puffs by mouth every 4 hours as needed for shortness of breath/wheezing.    ATORVASTATIN 40 MG TABLET    TAKE ONE TABLET BY MOUTH ONE TIME DAILY     BLOOD GLUCOSE TEST STRIP    1 strip 3 times a day. Use to check blood sugar.    FLUTICASONE PROPIONATE 50 MCG/ACT NASAL SPRAY    Spray 2 sprays into each nostril daily.    GLUCOMETER (ONETOUCH VERIO) KIT    Use to monitor blood glucose.    INSULIN GLARGINE (BASAGLAR KWIKPEN) 100 UNIT/ML PEN-INJECTOR    INJECT 20 UNITS UNDER THE SKIN EVERY MORNING    INSULIN PEN NEEDLE (BD PEN NEEDLE NANO 2ND GEN) 32G X 4 MM MISCELLANEOUS    Inject 1 each under the skin 4 times a day. Use one pen needle to inject insulin four times daily    JARDIANCE 25 MG TABLET    TAKE ONE TABLET BY MOUTH ONE TIME DAILY     LANCET (ONETOUCH DELICA) 53I    Use 1 each 3 times a day.    LOSARTAN 50 MG TABLET    Take  1 tablet (50 mg) by mouth daily.    METOPROLOL SUCCINATE ER 25 MG 24 HR TABLET    TAKE TWO TABLETS BY MOUTH DAILY (DO NOT CRUSH OR CHEW TABLETS)    NIFEDIPINE ER 30 MG 24 HR TABLET    TAKE TWO TABLETS BY MOUTH TWICE DAILY     SILDENAFIL 100 MG TABLET    Take 1 tablet (100 mg) by mouth daily as needed for erectile dysfunction.   Modified Medications    No medications on file   Discontinued Medications    No medications on file

## 2020-07-18 ENCOUNTER — Encounter (INDEPENDENT_AMBULATORY_CARE_PROVIDER_SITE_OTHER): Payer: Self-pay | Admitting: Physician Assistant

## 2020-07-23 ENCOUNTER — Encounter (INDEPENDENT_AMBULATORY_CARE_PROVIDER_SITE_OTHER): Payer: Self-pay | Admitting: Physician Assistant

## 2020-07-23 DIAGNOSIS — E1122 Type 2 diabetes mellitus with diabetic chronic kidney disease: Secondary | ICD-10-CM

## 2020-07-23 DIAGNOSIS — N1832 Chronic kidney disease, stage 3b: Secondary | ICD-10-CM

## 2020-07-23 DIAGNOSIS — Z794 Long term (current) use of insulin: Secondary | ICD-10-CM

## 2020-07-23 MED ORDER — METFORMIN HCL 1000 MG OR TABS
1000.0000 mg | ORAL_TABLET | Freq: Two times a day (BID) | ORAL | 0 refills | Status: DC
Start: 2020-07-23 — End: 2020-10-15

## 2020-07-23 NOTE — Telephone Encounter (Signed)
Generic response sent.    Routing to provider to please advise on DM medication change. -Thanks!

## 2020-08-16 NOTE — Progress Notes (Signed)
UROLOGY CLINIC NOTE    Reason for Visit:  Left renal mass    History of Present Illness:  Sean Oliver is a 60 year old male who returns to me for follow up for a 1.9 cm left renal mass on active surveillance. His creatinine has been elevated to 1.5 on 05/10/2017.     He has mild claustrophobia.     Interval History:  Last seen 03/20/19, renal mass was stable at that time, also discussed his treatment options and he was to reach out to [place] after discussing with his family. He has not had any imaging studies done since MRI in 03/2019, according to our records. No issues since last visit. He continues to do well today.         PMH: stage 3 CKD (Cr 1.54), HTN,  type II DM, diverticulitis s/p colectomy x2 (once with mesh). incisional hernia, OSA on CPAP      Past Medical Hx:    Past Medical History:   Diagnosis Date   . Cancer ([place])    . Diabetes mellitus ([place])    . Hypertension    . Kidney disease    . Lipidemia    . Lung disease    . Type II or unspecified type diabetes mellitus without mention of complication, not stated as uncontrolled      Past Surgical Hx:    Past Surgical History:   Procedure Laterality Date   . left eye surgery x 5     . PR RPR INGUN HERNIA SLIDING ANY AGE      many years ago.  bilateral.     has a current medication list which includes the following prescription(s): albuterol hfa, atorvastatin, blood glucose, fluticasone propionate, glucometer, insulin glargine, bd pen needle nano 2nd gen, jardiance, lancet, losartan, metformin, metoprolol succinate er, nifedipine er, and sildenafil.    Allergies:    Review of patient's allergies indicates:  Allergies   Allergen Reactions   . Peanuts Anaphylaxis       OBJECTIVE:  Physical Exam:  Vitals: BP 117/72   Pulse 65   Temp 37.2 C (Temporal)   Wt (!) 101.6 kg (224 lb)   BMI 30.37 kg/m    Constitutional: Well-nourished, well-developed, no acute distress  Resp: Non-labored respirations on room air   Ext: no edema      I reviewed and discussed  laboratory results with the patient today.   Laboratory review:    Creatinine History:   07/2020 1.49  03/2020 1.62  07/2019 1.43  03/2019 1.70  07/2018 1.54  09/2017 1.49  06/2017 1.51  04/2017 1.61    EGFR by CKD History:   07/2020 53  03/2020 48  07/2019 51    I reviewed and discussed radiology results with the patient today.   Radiology review:  03/20/2019 MRI abdomen  Final read not available but images reviewed and left renal mass appears to be stable in size, around 1.8-1.9 cm.    03/22/2018 MRI Abdomen WO/W Con:  IMPRESSION:  1. Solid mass in the left kidney mid zone is unchanged in size or appearance   compared to MRI 05/10/2017. No new lesions.  2. T2 hyperintense lesion in segment 6 of the liver is unchanged compared to   MRI 05/10/2017, and remains indeterminate. Metastatic disease is less likely   given its stability, though not excluded    05/10/2017 MRI Abdomen WO/W Con:  1. Redemonstration of solid renal mass in the left interpolar kidney measuring  1.8 cm.    05/04/2017 CT Abs WO/W Con:  1. In the interpolar region of the left kidney, there is a 2.2 cm expansile   enhancing lesion with rapid washout, which is concerning for renal cell   carcinoma.     Discussion:  We again discussed the nature of renal tumors and reviewed the expected growth of renal masses is approximately 29m per year. Previously, his renal mass was stable and unchanged in size and appearance, so if it is kidney cancer, it is unlikely to be aggressive. He has not gotten repeat imaging studies done since his 03/2019 MRI. We again discussed his treatment options, which include active surveillance, excisional therapy in the form of partial or radical nephrectomy, or ablative therapy in the form of cryoablation or radiofrequency ablation. He would favor cryotherapy over retro partial nephrectomy if treatment is needed due to a preference for the least invasive intervention. We reviewed the rationale, comparative advantages, and  potential risks of each option. We also dicussed the benefit in getting control of comorbidities prior to any intervention. However, as he is overdue for surveillance MRI, we will plan to obtain this first to help guide further treatment options. If his renal mass remains stable, we may not recommend active intervention at this time as he continues to work on control of HTN and DM.     IMPRESSION/PLAN:   1) Left renal mass. Last imaging 03/2019, mass was stable. Repeat MRI Abd. Will return to clinic in 4 weeks to discuss treatment options.   2) Hypertension. Continue to work on with PCP.   3) Type II Diabetes.  Continue to work on with PCP.   4) Stage 3 CKD.  Continue to work on with PCP.     RTC in 4 weeks with MRI abdomen prior.     08/19/2020 @ 11:07 AM - I, MLouisa Second Medical Scribe acted as a sEducation administratorand documented the service/procedure performed to the best of my knowledge in the presence of JVonita Moss MD who will provide the final review and authentication.  Signed: MLouisa Second Medical Scribe

## 2020-08-19 ENCOUNTER — Telehealth (INDEPENDENT_AMBULATORY_CARE_PROVIDER_SITE_OTHER): Payer: Self-pay | Admitting: Urology

## 2020-08-19 ENCOUNTER — Ambulatory Visit: Payer: No Typology Code available for payment source | Admitting: Urology

## 2020-08-19 ENCOUNTER — Encounter (INDEPENDENT_AMBULATORY_CARE_PROVIDER_SITE_OTHER): Payer: Self-pay | Admitting: Urology

## 2020-08-19 VITALS — BP 117/72 | HR 65 | Temp 99.0°F | Wt 224.0 lb

## 2020-08-19 DIAGNOSIS — N2889 Other specified disorders of kidney and ureter: Secondary | ICD-10-CM

## 2020-08-19 NOTE — Telephone Encounter (Signed)
Patient needs an active request for MRI in order for [facility] Radiology Scheduling to be able to schedule. Provider requested MRI for [facility] location. Once MRI is ordered, then we will reach out to schedule imaging and follow up.

## 2020-08-27 ENCOUNTER — Other Ambulatory Visit: Payer: Self-pay | Admitting: Gastroenterology

## 2020-08-27 DIAGNOSIS — Z1211 Encounter for screening for malignant neoplasm of colon: Secondary | ICD-10-CM

## 2020-08-27 NOTE — Progress Notes (Signed)
[facility] at [place]  [address]  TEL: [phone]  l  FAX: [phone]      09/03/2020    PRIMARY CARE PROVIDER:  Matthew Saras[place] [address]    PATIENT: Sean Oliver    [id number]    IDENTIFICATION/CHIEF CONCERN:    Sean Oliver is a 60 year old male return patient seen today for history of SCC.    Chief Complaint   Patient presents with   . 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 Discussed minimizing irritation/pressure in that area. He likes to golf so this may be contributory.    Since the last visit he has not noticed any concerning lesions on his skin. He denies a full skin exam today. Denies any new lumps and bumps arising around the scar.     The spot that was biopsied on his hand healed well and he has no symptoms there.    He applies cocoa butter moisturizer on the skin when he gets out of the shower. Denies use of Sarna lotion for itching. Denies any issues with his toenail onychomycosis. He keeps these clipped short.     SOCIAL HISTORY:  Lives with wife and kids (triplets, born 2001).From [place]. History of sunburns on the scalp.    FAMILY HISTORY:  No immediate FHx of melanoma     PHYSICAL EXAMINATION:  General: He is a well appearing adult male in no acute distress, who has a normal build. He is pleasant with an  appropriate mood and affect without barriers to understanding.  Skin: exam performed included back, chest, abdomen, forearms, left knee. Exam was remarkable for:    L ulnar palm - no residual callus at biopsy site   Back - scattered seborrheic keratoses   L knee - well-healed scar, no evidence of local recurrence    IMPRESSION/PLAN:  The following was discussed with the patient.  Recommendations are as follows:    1. History of squamous cell carcinoma of skin  2. Scar  Mr. Prasad has a history of an invasive SCC on the left leg s/p excision 2016. He denies noticing any lesions on his skin that he would like evaluated today.  Skin exam performed on the back, chest abdomen, forearms, and left knee only. Patient denies a full skin exam today. He was advised to clinic for bleeding, scabbing, painful, growing, or non-healing lesions. Recommend regular skin examinations, self-monitoring, and sun protection including SPF 30+ broad spectrum sunscreen with reapplication and UPF clothing. Suggestions were provided in the AVS.    3. Seborrheic keratosis  Scattered seborrheic keratoses on the back today. These are benign overgrowths of the epidermis of the skin. Most often, they are harmless and require no treatment. Sometimes, these lesions can  become irritated or inflamed and can be treated with cryotherapy (liquid nitrogen).   - No treatment necessary today.     Return to clinic in 1 year for FBSE or sooner PRN acute concerns.    09/03/2020 @ 10:48 AM - I, Frann Rider, Medical Scribe acted as a Education administrator and documented the service/procedure performed to the best of my knowledge in the presence of Karlyne Greenspan, MD who will provide the final review and authentication.  Signed: Frann Rider, Medical Scribe    I, Karlyne Greenspan, MD, personally performed the services described in this documentation, as scribed by Frann Rider  in my presence, and it is both accurate and complete.

## 2020-08-27 NOTE — Telephone Encounter (Signed)
LVM to discuss with patient.

## 2020-09-03 ENCOUNTER — Ambulatory Visit: Payer: No Typology Code available for payment source | Attending: Dermatology | Admitting: Dermatology

## 2020-09-03 ENCOUNTER — Encounter (HOSPITAL_BASED_OUTPATIENT_CLINIC_OR_DEPARTMENT_OTHER): Payer: Self-pay | Admitting: Dermatology

## 2020-09-03 DIAGNOSIS — L905 Scar conditions and fibrosis of skin: Secondary | ICD-10-CM | POA: Insufficient documentation

## 2020-09-03 DIAGNOSIS — Z85828 Personal history of other malignant neoplasm of skin: Secondary | ICD-10-CM | POA: Insufficient documentation

## 2020-09-03 DIAGNOSIS — L821 Other seborrheic keratosis: Secondary | ICD-10-CM | POA: Insufficient documentation

## 2020-09-03 NOTE — Progress Notes (Signed)
Chief Complaint   Patient presents with   . Derm Problem     FSE       If our office needs to contact you after your visit today, is it ok to leave a detailed message on your phone or e-care ? YES      What is the preferred method of contact e-care/ phone  Number?[phone]

## 2020-09-03 NOTE — Patient Instructions (Signed)
HOW TO DO A SELF SKIN EXAMINATION:  (adapted from: http://www.skin cancer.org/skin-cancer-information/early-detection/step-by-step-self-examination)    1. Examine your face, especially the nose, lips, mouth, and ears (both sides of your ears). Use one or both mirrors to get a clear view.  2. Thoroughly inspect your scalp, using a blow dryer and mirror to expose each section to view. Get a friend or family member to help, if you can.  3. Check your hands carefully: palms and backs, between the fingers and under the fingernails. Continue up the wrists to examine both front and back of your forearms.  4. Standing in front of the full-length mirror, begin at the elbows and scan all sides of your upper arms. Don't forget the underarms.  5. Next focus on the neck, chest, and torso. Women should lift breasts to view the underside.  6. With your back to the full-length mirror, use the hand mirror to inspect the back of your neck, shoulders, upper back, and any part of the back of your upper arms you could not view in step 4.  7. Still using both mirrors, scan your lower back, buttocks, and backs of both legs.  8. Sit down; prop each leg in turn on the other stool or chair. Use the hand mirror to examine the genitals. Check front and sides of both legs, thigh to shin, ankles, tops of feet, between toes and under toenails. Examine soles of feet and heels.    Please be on the look out for any suspicious lesions.  These include growths or moles that are growing, changing or looking different than other growths or moles on the body. Also pay attention to any growths that itch, bleed, or don't heal. Consider taking digital photographs of any mole or growth that you want to monitor over time.  Call your provider if you have any questions or find a concerning growth or mole.    More information about skin cancer and what to look for can be found at:   http://www.myers.net/      CHANGES IN MOLES:  See your  health care provider if your moles hurt, itch, ooze, bleed, thicken, or become crusty. Call your health care provider if your moles show signs of melanoma. These include a mole that has:     Asymmetry. The sides of the mole don't match   Border. The edges are ragged, notched, or blurred   Color. The color within the mole varies   Diameter. The mole is larger than 6 mm (size of a pencil eraser)   Evolving. The mole is getting larger or the shape or color of the mole is changing     Adapted from:   2000-2015 [employer] [place], [place] [address]. All rights reserved. This information is not intended as a substitute for professional medical care. Always follow your healthcare professional's instructions.      PREVENTING SKIN CANCER:  Relaxing in the sun may feel good. But it isn't good for your skin. In fact, being exposed to the sun's harmful rays is a major cause of skin cancer.  People of all ages and backgrounds are at risk.     Your role in prevention  You can act today to help prevent skin cancer. Start by avoiding the sun's UV (ultraviolet) rays. And don't use tanning beds, which are no safer than the sun. Taking these steps can help keep you from getting skin cancer. It can also help prevent wrinkles and other sun-induced  aging effects. Make sure your children also follow these safeguards. Now is the time to start taking preventive steps against skin cancer.    When you are outdoors  Protect your skin when you go outdoors during the day.   · Wear tightly woven clothing that covers your skin. Put on a wide-brimmed hat to protect your face, ears, and scalp.  · Watch the clock. Try to avoid the sun between 10 a.m. and 4 p.m., when it is strongest.  · Head for the shade or create your own. Use an umbrella when sitting or strolling.  · Know that the sun’s rays can reflect off sand, water, and snow. This can harm your skin. Take extra care when you are near reflective  surfaces.  · Keep in mind that even when the weather is hazy or cloudy, your skin can be exposed to strong UV rays.  · Shield your skin with sunscreen. Also, apply sunscreen to your children’s skin.      Tips for using sunscreen  To help prevent skin cancer, choose the right sunscreen and use it correctly. Try the following tips:  · Choose a sunscreen that has a sun protection factor (SPF) of at least 30. Also, choose a sunscreen labeled “broad spectrum.” This will shield you from both UVA and UVB (ultraviolet A and B) rays.  · If one brand irritates your skin, try another  · Use a water-resistant sunscreen if swimming or sweating.  · Use at least an ounce of sunscreen (enough to fill a shot glass) to cover exposed areas. You might need to adjust the amount depending on your body size.  · Apply the sunscreen to dry skin about 15  minutes before going outdoors to give it time to be absorbed.  · Reapply sunscreen every 2 hours. If you’re active or in the water, do this more often.  · Cover any sun-exposed skin, from your face to your feet. Don’t forget your ears and your lips.  · Know that while sunscreen helps protect you, it isn’t enough. Sunscreens extend the length of time you can be outdoors before your skin begins to redden, but they don't give you total protection. Using sunscreen doesn't mean you can stay out in the sun indefinitely, since damage to the skin cells is still occurring. You should also wear protective clothing. And try to stay out of the sun as much as you can, especially from 10 a.m. to 4 p.m.    Adapted from:  © 2000-2015 [employer] [address], [place] [address]. All rights reserved. This information is not intended as a substitute for professional medical care. Always follow your healthcare professional's instructions.      SUNSCREEN RECOMMENDATIONS: Consumer Reports 2021, all are oxybenzone free (CAVEAT: If you have been instructed to use a product "safe list"  because you have suspected or confirmed allergic contact dermatitis allergy/allergies, please refer to that list rather than the one below).     Lotions:    -Equate ([employer]) Sport Lotion SPF50 (low price)   -Banana Boat Ultra Sport Lotion SPF 50+    Facial Lotions:    -Hawaiian Tropic silk Hydration Weightless Face Lotion SPF 30      Zinc Oxide/Titanium Dioxide only:    -Alba Botanica Sport Mineral Lotion SPF 45  -Aveeno Positively Mineral Sensitive Skin Lotion SPF 50    Sprays: (we recommend lotions instead of sprays but include them as many people prefer to use them)    -  Hawaiian Tropic Island Sport Spray SPF 30 (low price)   -Alba Botanica Hawaiian Coconut Clear spray SPF 50  -La Roche Posay Anthelios Lotion Spray SPF 60  -Banana Boat Ultra Sport Spray SPF 100    Sunscreen suggestions for people with darker skin tones (not tested with consumer reports)  Sun protection is important even when you naturally have more pigmentation in your skin. It helps protect against aging, skin cancer, uneven skin tone and is important for patients with sun sensitive conditions like lupus.     Many products leave a white, gray, blue or lavender cast on darker skin tones. Each person will have different preferences for texture: creamy, oily or matte so you may have to try a few before you find the perfect one for you. The ones listed below are more likely blend in to darker skin tones.    Chemical based sunscreens (all oxybenzone free):  Banana Boat Simply Protect Sport Spray SPF 50  Homosalate, Octisalate, Octocrylene, Zinc Oxide  Black Girl sunscreen SPF 30  Avobenzone, Homosalate, Octisalate, Octocrylene  Black Girl sunscreen Kids SPF 50  Avobenzone, Homosalate, Octisalate  Hawaiian Tropic Sheer Touch Ultra Radiance Lotion SPF30  Avobenzone, Homosalate, Octisalate, Octocrylene  La Roche Posay Anthelios Protective Oil SPF 50+  Mexoryl XL (ecamsule), Homosalate, Octocrylene  La Roche Posay Antihelios SX SPF 15  Mexoryl  (ecamsule), Avobenzone, Octocrylene  La Roche-Posay Antihelios Ultra Light SPF 60  Avobenzone, Homosalate, Octisalate, Octocrylene  Supergoop! Unseen Broad Spectrum SPF 40  Avobenzone, Homosalate, Octisalate, Octocrylene    Very expensive chemical based sunscreens  Glossier Invisible shield gel SPF 35  Avobenzone, Homosalate, Octisalate  Murad Invisiblur ($65)  Avobenzone, Homosalate, Octisalate, Octocrylene    Mineral Sunscreens:  Drunk Elephant Umbra Sheer Physical Defense Protection SPF 30  Zinc Oxide 20%  EltaMD UV Clear SPF 46  Zinc Oxide 9%, Octinoxate  Unsun tinted sunscreen SPF 30 (tinted in a variety of shades)  Zinc Oxide, Titanium Oxide

## 2020-09-14 ENCOUNTER — Other Ambulatory Visit (INDEPENDENT_AMBULATORY_CARE_PROVIDER_SITE_OTHER): Payer: Self-pay | Admitting: Family Medicine

## 2020-09-14 ENCOUNTER — Ambulatory Visit
Admission: RE | Admit: 2020-09-14 | Discharge: 2020-09-14 | Disposition: A | Discharge status: Home / Self Care | Payer: No Typology Code available for payment source | Attending: Diagnostic Radiology | Admitting: Diagnostic Radiology

## 2020-09-14 DIAGNOSIS — N2889 Other specified disorders of kidney and ureter: Secondary | ICD-10-CM | POA: Insufficient documentation

## 2020-09-14 DIAGNOSIS — Z794 Long term (current) use of insulin: Secondary | ICD-10-CM

## 2020-09-14 DIAGNOSIS — N1832 Chronic kidney disease, stage 3b: Secondary | ICD-10-CM

## 2020-09-14 DIAGNOSIS — E1122 Type 2 diabetes mellitus with diabetic chronic kidney disease: Secondary | ICD-10-CM

## 2020-09-14 MED ORDER — GADOTERIDOL 279.3 MG/ML IV SOLN
20.0000 mL | Freq: Once | INTRAVENOUS | Status: AC | PRN
Start: 2020-09-14 — End: 2020-09-14
  Administered 2020-09-14: 10 mmol via INTRAVENOUS

## 2020-09-16 ENCOUNTER — Other Ambulatory Visit (INDEPENDENT_AMBULATORY_CARE_PROVIDER_SITE_OTHER): Payer: Self-pay | Admitting: Family Medicine

## 2020-09-16 DIAGNOSIS — Z794 Long term (current) use of insulin: Secondary | ICD-10-CM

## 2020-09-16 DIAGNOSIS — E1122 Type 2 diabetes mellitus with diabetic chronic kidney disease: Secondary | ICD-10-CM

## 2020-09-16 DIAGNOSIS — N1832 Chronic kidney disease, stage 3b: Secondary | ICD-10-CM

## 2020-09-16 MED ORDER — JARDIANCE 25 MG OR TABS
ORAL_TABLET | ORAL | 0 refills | Status: DC
Start: 2020-09-16 — End: 2020-12-24

## 2020-09-16 NOTE — Telephone Encounter (Signed)
Patient last seen on 07/16/20 and was to return in one month.  One refill authorized.  Please schedule follow up visit.

## 2020-09-17 ENCOUNTER — Encounter (INDEPENDENT_AMBULATORY_CARE_PROVIDER_SITE_OTHER): Payer: Self-pay | Admitting: Physician Assistant

## 2020-09-17 ENCOUNTER — Encounter (HOSPITAL_BASED_OUTPATIENT_CLINIC_OR_DEPARTMENT_OTHER): Payer: Self-pay

## 2020-09-17 MED ORDER — BD PEN NEEDLE NANO ULTRAFINE 32G X 4 MM MISC: 1 refills | Status: DC

## 2020-09-17 NOTE — Telephone Encounter (Signed)
Medications were filled yesterday and today. Advised pt to schedule f/u appt per Gerald Stabs' instructions from 07/16/20 visit. Closing.

## 2020-09-17 NOTE — Telephone Encounter (Signed)
Called patient to schedule a follow up for medication refill- but patient declined to be seen for a follow up.     As per patient he doesn't need any refill and a follow up appointment.     Closing TE.

## 2020-09-19 ENCOUNTER — Encounter (INDEPENDENT_AMBULATORY_CARE_PROVIDER_SITE_OTHER): Payer: Self-pay

## 2020-09-26 NOTE — Telephone Encounter (Signed)
Patient read message on 09/19/2020    Closing encounter

## 2020-10-03 ENCOUNTER — Ambulatory Visit: Payer: No Typology Code available for payment source | Attending: Nephrology | Admitting: Nephrology

## 2020-10-03 DIAGNOSIS — E1122 Type 2 diabetes mellitus with diabetic chronic kidney disease: Secondary | ICD-10-CM

## 2020-10-03 DIAGNOSIS — I10 Essential (primary) hypertension: Secondary | ICD-10-CM

## 2020-10-03 DIAGNOSIS — N2889 Other specified disorders of kidney and ureter: Secondary | ICD-10-CM

## 2020-10-03 DIAGNOSIS — N1831 Chronic kidney disease, stage 3a: Secondary | ICD-10-CM | POA: Insufficient documentation

## 2020-10-03 DIAGNOSIS — N1832 Chronic kidney disease, stage 3b: Secondary | ICD-10-CM | POA: Insufficient documentation

## 2020-10-03 DIAGNOSIS — Z794 Long term (current) use of insulin: Secondary | ICD-10-CM | POA: Insufficient documentation

## 2020-10-03 NOTE — Progress Notes (Signed)
NEPHROLOGY CLINIC FOLLOW UP NOTE  10/03/2020            REFERRING PHYSICIAN:   Morhaf Al Achkar     INITIAL CONSULT DATE: 05/28/2017      Past Medical History:   Diagnosis Date   . Cancer ([place])    . Diabetes mellitus ([place])    . Hypertension    . Kidney disease    . Lipidemia    . Lung disease    . Type II or unspecified type diabetes mellitus without mention of complication, not stated as uncontrolled      Past Surgical History:   Procedure Laterality Date   . left eye surgery x 5     . PR RPR INGUN HERNIA SLIDING ANY AGE      many years ago.  bilateral.        HISTORY OF PRESENT ILLNESS: Sean Oliver is a 60 year old male with past medical history significant for the above mentioned problems.    CC: Follow up on CKD and Hypertension     Based on verbal report, past medical history is significant for:    Diabetes mellitus of at least 15 years duration, though has been very well controlled over this period of time.  He was initially started off on insulin but subsequently has been switched to metformin only with good glycemic control.  By his report, he does not know of any end organ damage including absence of any diabetic retinopathy or neuropathy.  Also has long-standing hypertension of at least 20 years duration.  While he has not checked his pressures regularly at home, he reports that for the most part at doctors visits he was told that the blood pressure is only marginally elevated.  Prolonged time he was on combination lisinopril with hydrochlorothiazide, that was changed subsequently to alternative agents.  One reasoning for stopping lisinopril was to determine if it would lead to an improvement in creatinine/GFR.  However, over the last few months patient has been experiencing uncontrolled hypertension with most readings in the 160s-170s/90s-100 range.  He is not specifically expressing any symptoms related to these uncontrolled readings and denies any history of TIAs/CVAs.  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  Also had considerable issues with diverticulitis leading to laparotomy with limited resection as well in the past.    During the recent follow-ups with primary care physician and in effort to undertake a diagnostic evaluation for his chronic kidney disease, both urinary and imaging studies were undertaken.  Urinalyses now revealing increased albuminuria while imaging studies including ultrasound, CT scan and MRI also indicating around 2 cm left-sided interpolar mass most compatible with renal cell carcinoma.  He has not experienced any specific flank pains, dysuria, renal colic or hematuria.  Similarly no major change in urinary habit and no problems with dependent edema.    No history of nephrolithiasis, recurrent urinary tract infections or pyelonephritis.  Only occasional use of NSAIDs with no over-the-counter/herbal supplement use.    Interval History:  Overall history is significant for hospitalization between 2/1-07/19/2019 for hyperglycemic hyperosmolar syndrome (HHS) as well as accelerated hypertension.  Since that hospitalization is management has been modified with initiation of empagliflozin and insulin.  He was followed by diabetes clinic after that.  For hypertension, lisinopril was previously switched to losartan on the complaints of a cough.       Has not been checking his blood pressures at  home; but previous clinic reading was well controlled before the higehr reading today.  Has a list of his antihypertensives on his phone that checks out with the med profile.  However, he is denying any headaches, visual changes or postural problems.  No history of dependent edema.    Since initiation of empagliflozin, he does not describe any significant change in his urinary output.  Has not experienced any issues with urinary tract infections or other genital problems.  However, he is bothered by a persistent cough that he  thinks is linked with empagliflozin.  He was off of it for period of time during which the cough improved and has recovered since resuming that medication again.  He is quite bothered by it and is unhappy about continuing the drug.    Previously encountered nocturia has dissipated now that he is using CPAP regularly.      Also is continuing to follow-up with urology for left renal lesion with regards to suspicion for renal cell carcinoma.  He is undergoing a MRI annually with last one on 09/14/2020 that showed a slight increase in the size.  He is going to make a follow-up clinic visit with Sean Oliver to discuss management.      He has not experienced any dyspnea, chest pain, visual problems or dependent edema.          ALLERGIES:    Review of patient's allergies indicates:  Allergies   Allergen Reactions   . Peanuts Anaphylaxis       HOME MEDICATIONS:      Current Outpatient Medications:   .  albuterol HFA 108 (90 Base) MCG/ACT inhaler, Inhale 2 puffs by mouth every 4 hours as needed for shortness of breath/wheezing., Disp: 1 Inhaler, Rfl: 11  .  atorvastatin 40 MG tablet, TAKE ONE TABLET BY MOUTH ONE TIME DAILY , Disp: 90 tablet, Rfl: 3  .  BD Pen Needle Nano U/F 32G X 4 MM miscellaneous, Inject 1 each under the skin 4 times a day. Use one pen needle to inject insulin four times daily, Disp: 400 each, Rfl: 1  .  blood glucose test strip, 1 strip 3 times a day. Use to check blood sugar. (Patient not taking: Reported on 07/16/2020), Disp: 100 each, Rfl: 11  .  fluticasone propionate 50 MCG/ACT nasal spray, Spray 2 sprays into each nostril daily., Disp: 16 g, Rfl: 6  .  glucometer (OneTouch Verio) kit, Use to monitor blood glucose. (Patient not taking: Reported on 03/18/2020), Disp: 1 each, Rfl: 0  .  insulin GLARGINE (Basaglar KwikPen) 100 UNIT/ML pen-injector, INJECT 20 UNITS UNDER THE SKIN EVERY MORNING, Disp: 15 mL, Rfl: 3  .  Jardiance 25 MG tablet, TAKE ONE TABLET BY MOUTH ONE TIME DAILY, Disp: 90 tablet, Rfl: 0  .   lancet (OneTouch Delica) 75Z, Use 1 each 3 times a day. (Patient not taking: Reported on 07/16/2020), Disp: 100 each, Rfl: 11  .  losartan 50 MG tablet, Take 1 tablet (50 mg) by mouth daily., Disp: 90 tablet, Rfl: 3  .  metFORMIN 1000 MG tablet, Take 1 tablet (1,000 mg) by mouth 2 times a day with meals., Disp: 180 tablet, Rfl: 0  .  metoprolol succinate ER 25 MG 24 hr tablet, TAKE TWO TABLETS BY MOUTH DAILY (DO NOT CRUSH OR CHEW TABLETS), Disp: 180 tablet, Rfl: 3  .  NIFEdipine ER 30 MG 24 hr tablet, TAKE TWO TABLETS BY MOUTH TWICE DAILY , Disp: 360 tablet, Rfl: 3  .  sildenafil 100 MG tablet, Take 1 tablet (100 mg) by mouth daily as needed for erectile dysfunction., Disp: 30 tablet, Rfl: 6        PHYSICAL EXAM:     Vitals Signs: BP (!) (P) 146/93   Pulse (P) 66   Temp (P) 36.8 C (Temporal)   Ht (P) 6' (1.829 m)   Wt (P) 98.9 kg (218 lb)   SpO2 (P) 99%   BMI (P) 29.57 kg/m .    Constitutional: Sitting comfortably in chair, in no apparent distress.  Eyes:  Pupils normal size, EOM intact  Mouth/Throat: Mucous membranes are moist,  no oropharyngeal lesions  Neck: No JVD or carotid bruits  Lungs: No rhonchi or creptitus  Heart: S1, S2 with II/VI ESM, no rubs/ gallops  Abdomen: Soft, nontender; bowel sounds normal  Extremities: No pretibial or pedal edema,    Neuro: Alert O X3, no focal deficiits  Skin: No rash        DIAGNOSTIC STUDIES:        04/19/2017 11:01 07/05/2017 16:47 03/07/2019 14:46 07/19/2019 07/16/2020   Sodium 141 144 143 133 141   Potassium 3.8 3.9 4.0 3.9 4.1   Chloride 106 107 108 99 106   Carbon Dioxide, Total 29 30 28 26 28    Anion Gap 6 7 7      Glucose 102 117 136 (H) 332 207   Urea Nitrogen 19 18 23  (H) 17 16   Creatinine 1.61 (H) 1.51 (H) 1.62 (H) 1.55 1.49   GFR, Calc, African American 54 (L) 59 (L)      eGFR, Calculated   47 (L) 49 51   Calcium 9.2 9.6 9.2 9.1 9.1   Phosphate  3.5 2.9                  MRI 09/14/2020:    Compared to MRI abdomen 03/20/2019:    1. Interval growth of hyperenhancing  left interpolar mass now measuring 2.3 cm, previously 1.9 cm. 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 No evidence of local or vascular invasion.    2. Similar indeterminant 1 cm segment 6 hepatic lesion; likely benign given long-term stability.      ASSESSMENT AND PLAN:    -Chronic Kidney Disease Stage 3A  Review of available labs since at least 2015 show a creatinine ranging from 1.27-1.61 (and one value obtained through [facility] from 04/28/1996 of 1.27m/dL).  This indicates long-standing element of chronic kidney disease with eGFR likely in the ~ 55 mL/minute range albeit a more or less stable course over this period of time with minimal progression.  Last serum creatinine at 1.49 with eGFR  536mminute, that indicates overall stable status without noticeable progression.  Albuminuria continues with variable severity [id number]  Continues on losartan with empagliflozin.  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  Ultrasound of the kidneys is instructive in this regard indicating increased echogenicity from interstitial fibrosis.  Previously, a monoclonal disorder was ruled out with urine and serum electrophoresis studies as well as free light chain assays.      Needs continued aggressive risk factor modification (see below).    -Hypertension, Primary  Unfortunately patient does not check home readings.  Given stable clinic reading back in March 2022, will continue on current regimen of nifedipine XL 60 mg twice daily, metoprolol extended release 50 mg daily and  losartan 50 mg daily.  I have instructed him to start checking his blood pressures regularly at home for additional antihypertensive modification.       -Diabetes mellitus type 2, uncontrolled  After course of relatively stable control of diabetes mellitus,  was hospitalized in early February 2021 with hyperglycemic hyperosmolar syndrome requiring initiation of insulin.  Since that time he has also been started on empagliflozin.  Last hemoglobin A1c at 13%.    Continue losartan for maximum anti-proteinuric effect.    Reporting a temporal association with bothersome cough since empagliflozin and requesting a change.  We will send a message to his primary care physician Dr. Lavena Bullion for possible switch to Dapagliflozin.    -Renal cell carcinoma   Imaging studies showing a 2 cm lesion in the interpolar region of the left kidney, likely renal cell carcinoma.  Is being actively followed in urology clinic where pros and cons of conservative versus surgical therapy were discussed.  On the basis of recent MRI 09/14/2020 there appears to be slight progression to now 2.3 cm lesion.  He is going to follow-up with Sean Oliver in the urology clinic for additional management plan.       Return to clinic in 6 months with CBC, renal function panel, urinalysis with albumin to creatinine ratio, iPTH.      Sean Hausen, MD, 10/03/2020  Division of Nephrology  [facility]  [place], [place]

## 2020-10-09 ENCOUNTER — Encounter (INDEPENDENT_AMBULATORY_CARE_PROVIDER_SITE_OTHER): Payer: Self-pay

## 2020-10-10 ENCOUNTER — Telehealth (INDEPENDENT_AMBULATORY_CARE_PROVIDER_SITE_OTHER): Payer: Self-pay | Admitting: Family Medicine

## 2020-10-10 NOTE — Telephone Encounter (Signed)
LMTCB to schedule with Alan Mulder or Smitty Knudsen per Dr. Amie Critchley.    1st Attempt

## 2020-10-10 NOTE — Telephone Encounter (Signed)
-----   Message from Cyndia Diver, MD sent at 10/10/2020  8:15 AM PDT -----  Regarding: RE: Empagliflozin  Thanks Dr. Wynell Balloon. I'm covering for Dr. Lavena Bullion while she is away.    [facility] front desk, could we please schedule this patient with Alan Mulder or Smitty Knudsen, cc'd, about the below? Diabetes follow up and cough evaluation.    [name], I do not know this patient's history too well but just a quick look and I see likely renal cell carcinoma that is generally stable but has had recent interval progression, and I also know is a risk to mets to the lungs, and I haven't heard of cough associated with SGLT2's before. Last chest X-ray over a year ago so have to wonder if we need more imaging than that despite the patient's reported association with Jardiance. I think I'd at least consider a CT as well unless the cause of the cough is clearly obvious on X-ray or H&P. I also see losartan on his med list.    Thanks all, let me know if I can help further.  ----- Message -----  From: Johnathan Hausen, MD  Sent: 10/03/2020  11:20 AM PDT  To: Matthew Saras, MD  Subject: Empagliflozin                                    Zenaida Deed,  Sean Oliver is complaining of a chronic cough that he is absolutely sure is linked with empagliflozin.  He reports that he got off of it for a few weeks with resolution and the cough has returned ever since he resumed back and appears to be quite distressed.  He was actually wanting to completely come off of SGLT2 inhibitors which I have counseled him against.    Maybe you can consider switching him from empagliflozin to dapagliflozin and that might resolve this curious cough issue.    He wanted me to reach out to you regarding this problem.    Thanks,  Lafayette Dragon  (Nephrology)

## 2020-10-12 ENCOUNTER — Other Ambulatory Visit (INDEPENDENT_AMBULATORY_CARE_PROVIDER_SITE_OTHER): Payer: Self-pay | Admitting: Physician Assistant

## 2020-10-12 DIAGNOSIS — Z794 Long term (current) use of insulin: Secondary | ICD-10-CM

## 2020-10-12 DIAGNOSIS — E1122 Type 2 diabetes mellitus with diabetic chronic kidney disease: Secondary | ICD-10-CM

## 2020-10-12 DIAGNOSIS — N1832 Chronic kidney disease, stage 3b: Secondary | ICD-10-CM

## 2020-10-14 ENCOUNTER — Telehealth (INDEPENDENT_AMBULATORY_CARE_PROVIDER_SITE_OTHER): Payer: No Typology Code available for payment source | Admitting: Urology

## 2020-10-14 DIAGNOSIS — N2889 Other specified disorders of kidney and ureter: Secondary | ICD-10-CM

## 2020-10-14 NOTE — Progress Notes (Signed)
UROLOGY FOLLOW-UP CLINIC VISIT    Distant Site Telemedicine Encounter    I conducted this encounter from [facility] via secure, live, face-to-face video conference with the patient. Sean Oliver was located at Home.  I reviewed the risks and benefits of telemedicine as pertinent to this visit and the patient agreed to proceed.    ID/CC:    Renal mass    History of Present Illness:   Sean Oliver is a 60 year old male who returns for follow-up of a renal mass.    He has multiple medical comorbidities including CKD, type 2 diabetes, hypertension, and diverticulitis status post colectomy x2, with a history of incisional hernia and closure with mesh.  He is on CPAP for OSA.  We have followed him for an incidentally diagnosed solid enhancing left renal mass that was initially 2.2 cm on CT in 2018, though remeasured a few days later is 1.8 cm on MRI.  This was stable in size in 2019, and 2020.  We last saw him a few months ago at which point he had not yet had interval reimaging.  A plan was made for repeat MRI and return visit to discuss.  At that last visit he voiced a preference for the least invasive approach for treatment, should he require this.    Interval History:  MRI April 2022 with interval growth of the mass to 2.3 cm. No other interval changes in his health. A1c 3 months ago 7.1. Reports his BP is better controlled than previously.     Past Medical History:   Diagnosis Date   . Cancer ([place])    . Diabetes mellitus ([place])    . Hypertension    . Kidney disease    . Lipidemia    . Lung disease    . Type II or unspecified type diabetes mellitus without mention of complication, not stated as uncontrolled        Past Surgical History:   Procedure Laterality Date   . left eye surgery x 5     . PR RPR INGUN HERNIA SLIDING ANY AGE      many years ago.  bilateral.       Active Medications:      Current Outpatient Medications:   .  albuterol HFA 108 (90 Base) MCG/ACT inhaler, Inhale 2 puffs by mouth every 4 hours as needed  for shortness of breath/wheezing., Disp: 1 Inhaler, Rfl: 11  .  atorvastatin 40 MG tablet, TAKE ONE TABLET BY MOUTH ONE TIME DAILY , Disp: 90 tablet, Rfl: 3  .  BD Pen Needle Nano U/F 32G X 4 MM miscellaneous, Inject 1 each under the skin 4 times a day. Use one pen needle to inject insulin four times daily, Disp: 400 each, Rfl: 1  .  blood glucose test strip, 1 strip 3 times a day. Use to check blood sugar. (Patient not taking: Reported on 07/16/2020), Disp: 100 each, Rfl: 11  .  fluticasone propionate 50 MCG/ACT nasal spray, Spray 2 sprays into each nostril daily., Disp: 16 g, Rfl: 6  .  glucometer (OneTouch Verio) kit, Use to monitor blood glucose. (Patient not taking: Reported on 03/18/2020), Disp: 1 each, Rfl: 0  .  insulin GLARGINE (Basaglar KwikPen) 100 UNIT/ML pen-injector, INJECT 20 UNITS UNDER THE SKIN EVERY MORNING, Disp: 15 mL, Rfl: 3  .  Jardiance 25 MG tablet, TAKE ONE TABLET BY MOUTH ONE TIME DAILY, Disp: 90 tablet, Rfl: 0  .  lancet (OneTouch Delica) 70W, Use  1 each 3 times a day. (Patient not taking: Reported on 07/16/2020), Disp: 100 each, Rfl: 11  .  losartan 50 MG tablet, Take 1 tablet (50 mg) by mouth daily., Disp: 90 tablet, Rfl: 3  .  metFORMIN 1000 MG tablet, Take 1 tablet (1,000 mg) by mouth 2 times a day with meals., Disp: 180 tablet, Rfl: 0  .  metoprolol succinate ER 25 MG 24 hr tablet, TAKE TWO TABLETS BY MOUTH DAILY (DO NOT CRUSH OR CHEW TABLETS), Disp: 180 tablet, Rfl: 3  .  NIFEdipine ER 30 MG 24 hr tablet, TAKE TWO TABLETS BY MOUTH TWICE DAILY , Disp: 360 tablet, Rfl: 3  .  sildenafil 100 MG tablet, Take 1 tablet (100 mg) by mouth daily as needed for erectile dysfunction., Disp: 30 tablet, Rfl: 6    Allergies:    Allergies as of 10/14/2020 - Reviewed 10/03/2020   Allergen Reaction Noted   . Peanuts Anaphylaxis 07/16/2020       OBJECTIVE:  Physical Exam:    There were no vitals taken for this visit.  Deferred due to telemedicine visit    I reviewed the following radiology results with the  patient:  09/2020 MRI abd w/wo contrast which we interpret to demonstrate interval growth of the solid enhancing L renal mass to 2.3cm    ASSESSMENT/PLAN:  Sean Oliver is a 60 year old male with a L renal mass consistent with kidney cancer.    I had a long discussion with Sean Oliver regarding the nature of renal tumors including the likelihood that this represents a renal cell carcinoma. We discussed options for management of this renal mass including active surveillance and intervention with either ablation or extirpation. We also discussed the option of renal mass biopsy and the rationale for not proceeding with renal mass biopsy.    We discussed the risks, benefits, and alternatives to proceeding with renal mass biopsy. These include the risks of bleeding, infection, and non-diagnosis.    Plan:  1) patient and wife to think a bit about options and reach out, currently leaning towards RPscopic partial Nx      I, Leeanne Deed, MD, provided draft documentation of services performed by Dr. Jodi Mourning in my presence. To the best of my knowledge and ability, the statements included in the documentation accurately reflect the clinical procedure or services.

## 2020-10-15 ENCOUNTER — Encounter (INDEPENDENT_AMBULATORY_CARE_PROVIDER_SITE_OTHER): Payer: Self-pay | Admitting: Urology

## 2020-10-15 MED ORDER — METFORMIN HCL 1000 MG OR TABS
ORAL_TABLET | ORAL | 0 refills | Status: DC
Start: 2020-10-15 — End: 2021-01-27

## 2020-10-15 NOTE — Telephone Encounter (Signed)
Sean Oliver met with Dr. Jodi Mourning yesterday, and has decided to proceed with renal biopsy.   Routing to Dr. Jodi Mourning for review.     Sharlee Blew, RN BSN  [facility]

## 2020-10-15 NOTE — Telephone Encounter (Signed)
I Arnez Stoneking to let him know that Dr. Jodi Mourning placed an order for a renal biopsy. I gave him the number for scheduling and asked that he let us know when he gets scheduled to set up a f/u with Dr. Jodi Mourning.     Sharlee Blew, RN BSN  [facility]

## 2020-10-15 NOTE — Telephone Encounter (Signed)
I placed an order for biopsy of renal mass. He will need f/u a week or two after this done to review pathology,

## 2020-10-15 NOTE — Telephone Encounter (Signed)
Appointment requested in 10/10/2020 TE

## 2020-10-18 ENCOUNTER — Encounter (HOSPITAL_COMMUNITY): Payer: Self-pay

## 2020-10-24 ENCOUNTER — Ambulatory Visit (INDEPENDENT_AMBULATORY_CARE_PROVIDER_SITE_OTHER): Payer: No Typology Code available for payment source | Admitting: Physician Assistant

## 2020-10-24 ENCOUNTER — Encounter (INDEPENDENT_AMBULATORY_CARE_PROVIDER_SITE_OTHER): Payer: Self-pay | Admitting: Physician Assistant

## 2020-10-24 ENCOUNTER — Ambulatory Visit (INDEPENDENT_AMBULATORY_CARE_PROVIDER_SITE_OTHER)
Admit: 2020-10-24 | Discharge: 2020-10-24 | Disposition: A | Discharge status: Home / Self Care | Payer: No Typology Code available for payment source

## 2020-10-24 VITALS — BP 109/72 | HR 67 | Temp 99.1°F | Resp 16 | Wt 212.2 lb

## 2020-10-24 DIAGNOSIS — N1832 Chronic kidney disease, stage 3b: Secondary | ICD-10-CM

## 2020-10-24 DIAGNOSIS — G8929 Other chronic pain: Secondary | ICD-10-CM

## 2020-10-24 DIAGNOSIS — Z794 Long term (current) use of insulin: Secondary | ICD-10-CM

## 2020-10-24 DIAGNOSIS — M79672 Pain in left foot: Secondary | ICD-10-CM

## 2020-10-24 DIAGNOSIS — R053 Chronic cough: Secondary | ICD-10-CM

## 2020-10-24 DIAGNOSIS — N2889 Other specified disorders of kidney and ureter: Secondary | ICD-10-CM

## 2020-10-24 DIAGNOSIS — E1122 Type 2 diabetes mellitus with diabetic chronic kidney disease: Secondary | ICD-10-CM

## 2020-10-24 LAB — A1C RAPID, ONSITE: HEMOGLOBIN A1C, ONSITE: 6.6 — ABNORMAL HIGH (ref 4.0–6.0)

## 2020-10-24 NOTE — Patient Instructions (Addendum)
Your A1c is quite good at 6.6% today.     You can consider discontinuing your insulin 5 units.   I would suggest a small change to your diet such as:  1) Mix your gatorade with water all the time (50/50)  2) Maybe less sweets occasionally.     -----------------------------    For your cough, lets get a chest x-ray.  If this is normal, we will proceed with the CT chest.     This may be related to silent reflux or post-nasal drip with allergies.     -------------------------------    Let's get an x-ray of your Left foot for arthritis.   If no arthritis, try diclofenac (Voltaren) gel 4 times a day then reduce as this improves.   Also, try the stretches or consider formal physical therapy.

## 2020-10-24 NOTE — Progress Notes (Signed)
[facility] OUTPATIENT CLINIC NOTE    Subjective:     Chief Complaint   Patient presents with   . Follow-Up      Diabetes    . Cough     Sean Oliver is a 60 year old male who presents on 10/24/2020 for the following concerns:    # F/U T2DM   Taking metformin, Jardiance and now using insulin glargine 5u.   Wondering about discontinuing insulin today.    Reports FBS 90-120 usually, few lows to 70s but rare   Notices this increases to FBS 190 if he has sugary drink but this is very rare   Drinks gatorade zero, does enjoy some candy from time to time   Denies burning foot pain.     # F/U CHRONIC COUGH   He notes a longstanding cough, sometimes productive of small amount non-purulent mucous   This was occurring primarily at night when lying   Now occurring during the day as well   Feels like he must clear his throat   Wonders if related to start of Jardiance which he associates temporally, also around same time was transitioned to Losartan which was then titrated   Has some seasonal allergies   Denies significant runny nose or congestion, has not felt heartburn in very longtime.     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    Denies plantar foot pain.     Interpreter needed? NO    I reviewed the patient's recorded medical history, and confirmed the medications, problem list, and allergies with the patient.     ROS: Per HPI    Objective:   BP 109/72   Pulse 67   Temp 37.3 C (Temporal)   Resp 16   Wt 96.3 kg (212 lb 3.2 oz)   SpO2 98%   BMI (P) 28.78 kg/m   GEN: No acute distress, alert.   HEENT: Normocephalic, atraumatic. MMM/AS.   CV: RRR, S1S2 without m/r/g. No LE edema.   CHEST: Nml respiratory rate and effort. LCTAB without r/r/w.  MSK: Ambulating without assistance. No  gross range of motion deficits. +L foot, medial aspect w/o swelling, with TTP along midfoot ~distal talus vs navicular bones.  No pain with resisted ankle ROM.    DATA:  Hospital Encounter on 10/24/20   1. XR Foot 2 View Left    Narrative    EXAMINATION:  XR FOOT 2 VW LEFT    CLINICAL INDICATION:  former athlete with L ankle Fx x2, having more chronic ~1y medial midfoot pain -- evaluate for OA or other bony etiology for pain at navicular to ~base of 1st metatarsal     COMPARISON:   No prior images are currently available for comparison.    FINDINGS AND     Impression    No acute fracture or dislocation. 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 Tiny Achilles enthesophyte. No focal soft tissue swelling.    I have personally reviewed the images and agree with the report (or as edited).   Office Visit on 10/24/20   2. POC Whole Blood A1C   Result Value Ref Range    HEMOGLOBIN A1C, ONSITE 6.6 (H) 4.0 - 6.0  Assessment and Plan:   # 60 year old male with:  1. Type 2 diabetes mellitus with stage 3b chronic kidney disease, with long-term current use of insulin ([place])  At goal. He is taking metformin and Jardiance. Taking insulin glargine 5u currently and wanting to d/c this after significant taper from 20u with continued well controlled T2DM. PLAN for d/c insulin, advised he consider holding occasional candy and furthermore diluting his gatorade zero. Continue metformin and SGLT2.    - POC Whole Blood A1C    2. Chronic cough  3. Left renal mass c/f RCC w/u ongoing  We discussed that cough is unlikely AE of his SGLT2. Could consider this was related to ARB as occurring around the same time this was titrated. Also may be 2/2 silent GERD or more UACS related to chronic rhinitis. Given he has L renal mass undergoing current w/u, there is potential this may be related to mets and will get CXR today and proceed with r/o pulmonary CA with CT chest, if CXR nml.   - CT Chest wo Contrast; Future  - XR Chest 2  View; Future    4. Chronic foot pain, left  1 year or more of L medial foot pain. H/o #2 "ankle" Fx around this location w/o surgery or hardware. DDx includes OA, retinaculum inflammation, chronic ligament pathology. XR today with e/o OA at talonavicular joint which is likely source of pain. Discussed HEP and trial of topical NSAID when symptomatic. May consider PT +/- ortho consult for possible CSI should this become worse for him or not responsive to conservative measures.   - XR Foot 2 View Left; Future    I spent a total of 40 minutes for the patient's care on the date of the service.    Rhetta Mura, PA-C  [facility] [place] [address]  [phone]      Active Ambulatory Problems     Diagnosis Date Noted   . Type 2 diabetes mellitus with stage 3b chronic kidney disease, with long-term current use of insulin ([name])    . History of squamous cell carcinoma of skin 04/02/2015   . Marijuana abuse 03/03/2016   . Mediastinal lymphadenopathy 03/03/2016   . Pulmonary nodule, right 03/03/2016   . Essential hypertension 04/07/2017   . CKD (chronic kidney disease) stage 3, GFR 30-59 ml/min ([facility]) 04/26/2017   . Proteinuria 04/26/2017   . Other hyperlipidemia 04/26/2017   . OSA (obstructive sleep apnea) 08/02/2018   . Kidney mass 10/03/2019     Resolved Ambulatory Problems     Diagnosis Date Noted   . 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 Cancer ([name])    . Diabetes mellitus ([name])    . Hypertension    . Kidney disease    . Lipidemia    . Lung disease    . Type II or unspecified type diabetes mellitus without mention of complication, not stated as uncontrolled        Patient's Medications   New Prescriptions    No medications on file   Previous Medications    ALBUTEROL HFA 108 (90 BASE) MCG/ACT INHALER    Inhale 2 puffs by mouth every 4 hours as needed for shortness of breath/wheezing.     ATORVASTATIN 40 MG TABLET    TAKE ONE TABLET BY MOUTH ONE TIME DAILY  BD PEN NEEDLE NANO U/F 32G X 4 MM MISCELLANEOUS    Inject 1 each under the skin 4 times a day. Use one pen needle to inject insulin four times daily    BLOOD GLUCOSE TEST STRIP    1 strip 3 times a day. Use to check blood sugar.    FLUTICASONE PROPIONATE 50 MCG/ACT NASAL SPRAY    Spray 2 sprays into each nostril daily.    GLUCOMETER (ONETOUCH VERIO) KIT    Use to monitor blood glucose.    INSULIN GLARGINE (BASAGLAR KWIKPEN) 100 UNIT/ML PEN-INJECTOR    INJECT 20 UNITS UNDER THE SKIN EVERY MORNING    JARDIANCE 25 MG TABLET    TAKE ONE TABLET BY MOUTH ONE TIME DAILY    LANCET (ONETOUCH DELICA) 75Z    Use 1 each 3 times a day.    LOSARTAN 50 MG TABLET    Take 1 tablet (50 mg) by mouth daily.    METFORMIN 1000 MG TABLET    TAKE 1 TABLET BY MOUTH TWICE DAILY WITH MEALS    METOPROLOL SUCCINATE ER 25 MG 24 HR TABLET    TAKE TWO TABLETS BY MOUTH DAILY (DO NOT CRUSH OR CHEW TABLETS)    NIFEDIPINE ER 30 MG 24 HR TABLET    TAKE TWO TABLETS BY MOUTH TWICE DAILY     SILDENAFIL 100 MG TABLET    Take 1 tablet (100 mg) by mouth daily as needed for erectile dysfunction.   Modified Medications    No medications on file   Discontinued Medications    No medications on file

## 2020-10-24 NOTE — Progress Notes (Signed)
Patient Rooming (in-clinic or Telemed): in clinic    Reason for Visit:   Chief Complaint   Patient presents with   . Follow-Up      Diabetes    . Cough         Refills? NO  Referral? NO  Letter or Form? NO  Lab Results? NO    HEALTH MAINTENANCE:  Has the patient had this done since their last visit?  Cervical screening/PAP: N/A  Mammo: N/A  Colon Screen: N/A    Have you seen a specialist since your last visit: No    Vaccines Due? YES    HM Due:   Health Maintenance   Topic Date Due   . Skin Cancer Monitoring for High Risk Patients  Never done   . Diabetes Eye Exam  Never done   . Hepatitis B Vaccine (1 of 3 - Risk 3-dose series) Never done   . Colorectal Cancer Screening  Never done   . Zoster Vaccine (1 of 2) Never done   . Depression Screening (PHQ-2)  03/04/2018   . Diabetes A1c  01/13/2021   . DTaP, Tdap, and Td Vaccines (3 - Td or Tdap) 02/07/2021   . Diabetes Nephropathy: Kidney Disease Monitoring  07/16/2021   . Diabetes Foot Exam  07/16/2021   . Lipid Disorders Screening  04/07/2022   . Pneumococcal Vaccine: Pediatrics (0-5 years) and At-Risk Patients (6-64 years) (2 of 2 - PPSV23) 02/26/2028   . Influenza Vaccine  Completed   . Hepatitis C Screening  Completed   . HIV Screening  Completed   . COVID-19 Vaccine  Completed   . Hepatitis A Vaccine  Aged Out       PCP Verified?  Yes, Matthew Saras, MD

## 2020-10-28 NOTE — Anesthesia Preprocedure Evaluation (Addendum)
Patient: Sean Oliver    Procedure Information     Date/Time: 10/31/20 0830    Procedure: US RENAL NEEDLE BX PERC    Location: [place] [facility]; [facility]        Phone Interview performed by Jomarie Longs RN on 10/29/20. Time spent with patient on this telephone visit: 19 minutes.    PI was completed to assess patient's previously uncontrolled hypertension. Patient reports checking his BP at home twice a week. Most recent home BP 109/78 on 5/16 (Monday). Reports SBP usually ~110s at home. At recent clinic visits, SBP mostly ~110-120s with one elevated reading of 146/93 on 10/03/20. Takes Losartan, Metoprolol, Nifedipine.    HPI:    Per D. Raskolnikov's Urology note on 10/14/20:    "Sean Oliver is a 60 year old male who returns for follow-up of a renal mass.    He has multiple medical comorbidities including CKD, type 2 diabetes, hypertension, and diverticulitis status post colectomy x2, with a history of incisional hernia and closure with mesh.  He is on CPAP for OSA.  We have followed him for an incidentally diagnosed solid enhancing left renal mass that was initially 2.2 cm on CT in 2018, though remeasured a few days later is 1.8 cm on MRI.  This was stable in size in 2019, and 2020.  We last saw him a few months ago at which point he had not yet had interval reimaging.  A plan was made for repeat MRI and return visit to discuss.  At that last visit he voiced a preference for the least invasive approach for treatment, should he require this.    Interval History:  MRI April 2022 with interval growth of the mass to 2.3 cm. No other interval changes in his health. A1c 3 months ago 7.1. Reports his BP is better controlled than previously.     I had a long discussion with [name] regarding the nature of renal tumors including the likelihood that this represents a renal cell carcinoma. We discussed options for management of this renal mass including active surveillance and intervention with  either ablation or extirpation. We also discussed the option of renal mass biopsy and the rationale for not proceeding with renal mass biopsy.    Patient and wife to think a bit about options and reach out, currently leaning towards RPscopic partial Nx."    Abubakar Crispo is scheduled for a renal biopsy on 10/31/20.    Relevant Problems   Anesthesia   (+) OSA (obstructive sleep apnea)      Pulmonary   (+) OSA (obstructive sleep apnea)      Neuro/Psych   (+) History of squamous cell carcinoma of skin      Cardio   (+) Essential hypertension      GU/Renal   (+) CKD (chronic kidney disease) stage 3, GFR 30-59 ml/min (HCC)      Endo   (+) Type 2 diabetes mellitus with stage 3b chronic kidney disease, with long-term current use of insulin (HCC)     Relevant surgical history:   Past Surgical History:   Procedure Laterality Date   . left eye surgery x 5     . PR RPR INGUN HERNIA SLIDING ANY AGE      many years ago.  bilateral.         Medications:     Outpatient:   Current Outpatient Medications   Medication Instructions   . albuterol HFA 108 (90 Base) MCG/ACT  inhaler 2 puffs, Inhalation, Every 4 hours PRN   . atorvastatin 40 MG tablet TAKE ONE TABLET BY MOUTH ONE TIME DAILY    . BD Pen Needle Nano U/F 32G X 4 MM miscellaneous Inject 1 each under the skin 4 times a day. Use one pen needle to inject insulin four times daily   . blood glucose test strip 1 strip, Does not apply, 3 times daily, Use to check blood sugar.   . fluticasone propionate 50 MCG/ACT nasal spray 2 sprays, Both Nostrils, Daily   . glucometer (OneTouch Verio) kit Use to monitor blood glucose.   . insulin GLARGINE (Basaglar KwikPen) 100 UNIT/ML pen-injector INJECT 20 UNITS UNDER THE SKIN EVERY MORNING   . Jardiance 25 MG tablet TAKE ONE TABLET BY MOUTH ONE TIME DAILY   . lancet (OneTouch Delica) 43X 1 each, Does not apply, 3 times daily   . losartan (COZAAR) 50 mg, Oral, Daily   . metFORMIN 1000 MG tablet TAKE 1 TABLET BY MOUTH TWICE DAILY WITH MEALS   .  metoprolol succinate ER 25 MG 24 hr tablet TAKE TWO TABLETS BY MOUTH DAILY (DO NOT CRUSH OR CHEW TABLETS)   . NIFEdipine ER 30 MG 24 hr tablet TAKE TWO TABLETS BY MOUTH TWICE DAILY    . sildenafil (VIAGRA) 100 mg, Oral, Daily PRN                Review of patient's allergies indicates:  Allergies   Allergen Reactions   . Peanuts Anaphylaxis       Social History:   Social History     Tobacco Use   . Smoking status: Former Smoker     Types: Cigars     Quit date: 05/29/2015     Years since quitting: 5.4   . Smokeless tobacco: Never Used   . Tobacco comment: 1-2 cigars/day for few years   Substance Use Topics   . Alcohol use: Yes     Comment: rarely   . Drug use: No       Medical History and Review of Systems  Documentation reviewed: patient health questionnaire and electronic medical record.    Source of information: Chart review.  Previous anesthesia: Yes (general and MAC)    History of anesthetic complications  (-) History of anesthetic complications.  (-) family history of anesthetic complications.      Functional Status   Able to walk 1 city block (200 yards), able to climb 2 flights of stairs or more without stopping and able to lay flat and still for 30 minutes.       Pulmonary   - Chronic cough with occasional production of a small amount of clear mucus. Mostly at night, particularly when laying on his side. Recent CXR 10/24/20 WNL.  - Denies recent SOB/difficulty breathing  - Denies recent COVID infection in the last 3 months. Denies current COVID symptoms.    (+) sleep apnea, CPAP    Neuro/Psych   Neg neuro/psych ROS    Cardiovascular   - HTN: takes Losartan, Metoprolol, Nifedipine. Checks BP at home about twice a week. SBP has been ~110s at home per patient report. Last home BP check on 5/16 was 109/78. SBP at clinic visits have been 110s-140s.  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 Recent Foot  XR on 10/24/20 demonstrated "no acute fracture or dislocation. 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 Uses OTC medicated lotion.    GI/Hepatic/Renal   - Left renal mass  - Hx of diverticulitis s/p colectomy x2  - Hx of incisional hernia s/p repair and closure with mesh  - Followed by Dr. Lloyd Huger Nephrology, last seen 10/03/20  (+) renal disease (Stage 3B CKD; baseline Cre 1.27-1.61; BUN/Cre 16/1.49, eGFR 51 on 07/16/20), CKD    Endo/Immunology   - Reports FBS 90-120 in the AM  -  Hx of hospitalization (2/1-07/19/2019) for diabetic hyperglycemic hyperosmolar syndrome (HHS)  requiring initiation of insulin. Was previously on 5 units of Glargine, but recently discontinued 10/24/20.  (+) diabetes (takes Metformin, Jardiance; last A1C 6.6. on 10/24/20), well controlled, type 2    Hematology   negative hematology ROS  Oncology   (+) cancer (Hx of SCC on the left leg s/p resection (2016))          Vitals 10/24/20  BP 109/72   Pulse 67   Temp 37.3 C (Temporal)   Resp 16   Wt 96.3 kg (212 lb 3.2 oz)   SpO2 98%   BMI (P) 28.78 kg/m    Prior BP 10/03/20: 146/93  Prior BP 08/19/20: 117/72    Physical Exam  Airway  Mallampati:  II  TM distance:  >6 cm  Neck ROM:  Full  Mouth Opening:  Normal  Facial Hair:  Beard    Dental  normal      Cardiovascular  normal      Pulmonary  normal             Labs: (last year)    BMP  CBC/Coags   Na 141 07/16/2020  Hb - -   K 4.1 07/16/2020  HCT - -   Cl 106 07/16/2020  WBC - -   HCO3 28 07/16/2020  PLT - -   BUN 16 07/16/2020  INR - -   Cr 1.49 (H) 07/16/2020  PT - -   Glu 207 (H) 07/16/2020  PTT - -       Misc   eGFR 51 (L) 07/16/2020  MCV - -   A1C 6.6 (H) 10/24/2020  BNP - -         Relevant procedures / diagnostic studies:   CXR 10/24/20  IMPRESSION  Lungs: Clear.  Pleura: No effusion. No pneumothorax.  Heart and mediastinum: Unremarkable.  Bones: No acute or suspicious abnormality.    MRI 09/14/2020  Compared  to MRI abdomen 03/20/2019:  1. Interval growth of hyperenhancing left interpolar mass now measuring 2.3 cm, previously 1.9 cm. 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 No evidence of local or vascular invasion.  2. Similar indeterminant 1 cm segment 6 hepatic lesion; likely benign given long-term stability.    EKG 07/17/19   Component   Ref Range & Units 07/17/19 1140     Ventricular Rate   BPM 67     Atrial Rate   BPM 67     P-R Interval   ms 142     QRS Duration   ms 102     Q-T Interval   ms 440     QTC Calculation   ms 464     P Axis   degrees 44     R Axis   degrees -60  T Axis   degrees 119     Diagnosis  Normal sinus rhythm   Left anterior fascicular block   Left ventricular hypertrophy with repolarization abnormality   Abnormal ECG   When compared with ECG of 09-Apr-2016 09:05,   ST no longer depressed in Inferior leads   T wave inversion no longer evident in Inferior leads     Echo 08/04/12 in [facility]  Summary:   1. Impaired relaxation pattern of LV diastolic filling.   2. Mildly dilated left atrium.   3. Mild left ventricular hypertrophy.   4. Trace aortic valve regurgitation.   5. Normal left ventricular systolic function.   6. Normal left ventricular ejection fraction.   7. The left ventricular cavity size is normal.   8. Trace tricuspid regurgitation.   9. Mildly elevated pulmonary artery systolic pressure estimated at 34.6   mmHg.   10. Trace mitral valve regurgitation.   11. Compared to prior Echo Study of 09/12 no significant change.        PAT CLINIC DISCUSSION    ANESTHESIA PLAN   Informed Consent:     Anesthesia Plan discussed with:        Patient    ASA Score:     ASA: 2  Planned Anesthetic Type:      MAC      Risk Calculators / Scores:

## 2020-10-29 ENCOUNTER — Ambulatory Visit: Payer: No Typology Code available for payment source

## 2020-10-29 NOTE — Preprocedure Instructions (Addendum)
Pre-Anesthesia Instructions    Day Before Surgery Call:  You will receive your arrival time phone call between 9 AM to 1 PM the workday before surgery.   If you do not receive that call by 2 PM, please call the [facility] before 6 PM at [phone].   After 6 PM, please call the main OR at: [phone] for your arrival time.    Fasting:  Do not eat or drink after midnight unless otherwise instructed.   You may have clear liquids until 2 hours before your check in time.  Examples of clear liquids are: water, clear apple juice, black coffee, and plain tea (no creamer or milk products).    Preparing for Surgery:  Shower the night before and the morning of surgery, from the neck down  - Do not use the soap on your face and hair. Use your regular soap and shampoo.  - Use the special soap (CHG) given to you  - Wash thoroughly, especially around the area of your surgery.   - You may avoid your private area.  - Leave it on your skin for one minute before you rinse off.      Do not use make-up, deodorant, lotions, hair products, or fragrances on the day of surgery.   Do not shave any part of your body 48 hours before surgery.    To reduce your risk of infection:  You will be given a warming blanket.  You will have your blood sugar checked in the preop area even if you are not diabetic.  An IV will be started in the pre-op and perioperative antibiotic may given as a prophylactic measure.    CPAP:  Bring your CPAP or BiPAP machine with you to surgery check in.    For your Safety:  Your surgeon will mark your surgery site.    Visiting Restrictions:  Due to COVID-19 visiting restrictions can change.  All Visitors must:  - Show their COVID-19 vaccination card or a negative COVID test results when entering the hospital  - Wear a mask at all times they are in the medical center  - At this time you are allowed to bring one person with you to surgery check in. Your visitor can remain in the waiting room when you are  called back to the preoperative area, or they can leave. If you give their phone number to the preop nurse they will get updates and the surgery will call them at when your surgery is over.  - If you are staying in the hospital; when you are in your room you may have one visitors in 24 hours. Your nursing team will update you with the current visiting policy, and coordinate your visitors.    Recovery Room Stay:  If going home on the day of surgery, you must have an adult to drive you home and someone to care for you for at least overnight.    Medications:  Unless otherwise directed by the surgery office/clinic, please stop all supplements and NSAID's (Tylenol is ok) seven days before your surgery date.    Please make sure to stop your Jardiance today, 5/17. Do not take this until after your surgery.    Albuterol Continue as needed   Atorvastatin Continue as scheduled   Fluticasone nasal spray Continue as scheduled   Jardiance HOLD TODAY, Tuesday 5/17   Losartan Hold the night before surgery   Metformin Hold the morning of surgery   Metoprolol Continue as scheduled  Nifedipine Continue as scheduled   Sildenafil Hold the morning of surgery       Patient repeated back instructions correctly.  Given PAC phone number to call for any questions: [phone]

## 2020-10-30 ENCOUNTER — Telehealth (HOSPITAL_COMMUNITY): Payer: Self-pay

## 2020-10-30 NOTE — Procedure Nursing Note (Signed)
Radiology Pre-Procedure Phone Call    Procedure: Native kidney biopsy with Anesthesia    Procedure date/time:  5/19 1130    Arrival time:0730 Rapid and then send to OP lab     NPO instructions: Nothing to eat or drink after midnight; if you need to take your morning medications, sips of water ONLY    Location: [address]nd Floor ([place]), [place] [address]  Take the elevators to the 2nd floor via the [facility] and check in at [facility] front desk & Radiology front desk    Telephone: [phone]    Labs: [id number]    Allergies: Peanuts         Medication:  Anticoagulants: No      Patient instructed to HOLD:   Please hold all supplements & vitamins    Special Note: If you are taking any of the following blood thinning medications, please contact us for instructions if you haven't received any    Apixiban (Eliquis)  Clopidogrel (Plavix)  Dabigatran (Pradaxa)  Enoxaparin (Lovenox)  Rivaroxaban (Xarelto)  Ticagrelor (Brilinta, Brilique, Possia)  Warfarin (Coumadin)  ASA    Take:follow medication instruction by the [facility]    COVID test: Rapid     Sedation assessment: Anesthesia pt requested due to anxiety    Spoke with: Patient    Post procedure plan: PACU    Discharge:  Discussed that patient will need a responsible drive post procedure. All patients will be required to have an escort, over the age of 37 - you cannot drive (or take a taxi, uber, lyft without an escort) No driver = No Sedation   Post procedure escort Name/Relationship: Santiago Glad caregiver    If your procedure is scheduled as outpatient, be prepared to spend a full day at the hospital.    Who to call post procedure:  Weekdays from 8 a.m. to 4:30 p.m., call the Interventional Radiology Department at [phone]  After hours and on weekends or holidays, call [phone] and ask to page the Interventional Radiology Fellow on call

## 2020-10-31 ENCOUNTER — Encounter (HOSPITAL_COMMUNITY): Payer: Self-pay

## 2020-10-31 ENCOUNTER — Encounter (HOSPITAL_COMMUNITY): Payer: No Typology Code available for payment source

## 2020-10-31 ENCOUNTER — Ambulatory Visit (HOSPITAL_COMMUNITY): Payer: Self-pay

## 2020-10-31 ENCOUNTER — Ambulatory Visit (HOSPITAL_BASED_OUTPATIENT_CLINIC_OR_DEPARTMENT_OTHER): Payer: No Typology Code available for payment source

## 2020-10-31 ENCOUNTER — Encounter (HOSPITAL_COMMUNITY): Payer: No Typology Code available for payment source | Admitting: Radiology Med

## 2020-10-31 ENCOUNTER — Other Ambulatory Visit (HOSPITAL_COMMUNITY): Payer: Self-pay | Admitting: Specialist

## 2020-10-31 ENCOUNTER — Ambulatory Visit
Admission: RE | Admit: 2020-10-31 | Discharge: 2020-10-31 | Disposition: A | Discharge status: Home / Self Care | Payer: No Typology Code available for payment source | Attending: Diagnostic Radiology | Admitting: Diagnostic Radiology

## 2020-10-31 ENCOUNTER — Ambulatory Visit (HOSPITAL_COMMUNITY)
Admit: 2020-10-31 | Discharge: 2020-10-31 | Disposition: A | Discharge status: Home / Self Care | Payer: No Typology Code available for payment source | Source: Home / Self Care | Admitting: Radiology Med

## 2020-10-31 DIAGNOSIS — N2889 Other specified disorders of kidney and ureter: Secondary | ICD-10-CM | POA: Insufficient documentation

## 2020-10-31 LAB — SARS-COV-2 (COVID-19) QUALITATIVE RAPID PCR: COVID-19 Coronavirus Qual PCR Result: NOT DETECTED

## 2020-10-31 LAB — CBC (HEMOGRAM)
Hematocrit: 47 % (ref 38.0–50.0)
Hemoglobin: 15.4 g/dL (ref 13.0–18.0)
MCH: 28.8 pg (ref 27.3–33.6)
MCHC: 32.9 g/dL (ref 32.2–36.5)
MCV: 88 fL (ref 81–98)
Platelet Count: 314 10*3/uL (ref 150–400)
RBC: 5.35 10*6/uL (ref 4.40–5.60)
RDW-CV: 15.1 % — ABNORMAL HIGH (ref 11.6–14.4)
WBC: 6.38 10*3/uL (ref 4.3–10.0)

## 2020-10-31 LAB — PROTHROMBIN TIME
Prothrombin INR: 1 (ref 0.8–1.3)
Prothrombin Time Patient: 12.7 s (ref 10.7–15.6)

## 2020-10-31 NOTE — Pre-Procedure Assessment (Signed)
Interventional radiology attending physician:    The patient is scheduled today 10/31/2020 for image guided biopsy of renal mass under anesthesia. The procedure is scheduled with anesthesia per patient's request.    Of note, [facility] is unable to perform procedure today as anesthesia resource was diverted for emergency procedure in interventional radiology. The circumstances were discussed directly with the patient and his wife by myself as well as [place], Lambert Keto. The patient and his wife did not wish to wait until later today in order to have procedure performed with anesthesia and have elected to reschedule the procedure again with anesthesia. Of note, we offered to perform the procedure with monitored conscious sedation; however, the patient refused to have the procedure performed with monitored conscious sedation.    In addition, patient's blood pressure is elevated above what is considered a "safe range" for performance of renal biopsy. The patient is on routine medications for hypertension and we instructed him to take his blood pressure medications prior to returning for his procedure.    All the patient's and his wife's questions were fully answered to their satisfaction.

## 2020-10-31 NOTE — Procedure Nursing Note (Signed)
Patient procedure to be rescheduled.  Please see MD noted for details.

## 2020-10-31 NOTE — Procedure Nursing Note (Signed)
Patient BP elevated 189/97 with rechecks of 173/106, 159/96 and 167/102.  Dr. Leonidas Romberg aware.  Will continue to monitor.

## 2020-11-01 ENCOUNTER — Ambulatory Visit (HOSPITAL_BASED_OUTPATIENT_CLINIC_OR_DEPARTMENT_OTHER): Payer: No Typology Code available for payment source | Admitting: Anesthesiology

## 2020-11-01 ENCOUNTER — Ambulatory Visit (HOSPITAL_COMMUNITY): Payer: No Typology Code available for payment source | Admitting: Anesthesiology

## 2020-11-01 ENCOUNTER — Ambulatory Visit
Admission: RE | Admit: 2020-11-01 | Discharge: 2020-11-01 | Disposition: A | Discharge status: Home / Self Care | Payer: No Typology Code available for payment source | Attending: Diagnostic Radiology | Admitting: Diagnostic Radiology

## 2020-11-01 ENCOUNTER — Ambulatory Visit (HOSPITAL_BASED_OUTPATIENT_CLINIC_OR_DEPARTMENT_OTHER)
Admit: 2020-11-01 | Discharge: 2020-11-01 | Disposition: A | Discharge status: Home / Self Care | Payer: No Typology Code available for payment source | Source: Home / Self Care

## 2020-11-01 DIAGNOSIS — N2889 Other specified disorders of kidney and ureter: Secondary | ICD-10-CM | POA: Insufficient documentation

## 2020-11-01 DIAGNOSIS — C642 Malignant neoplasm of left kidney, except renal pelvis: Secondary | ICD-10-CM

## 2020-11-01 MED ORDER — FENTANYL CITRATE (PF) 50 MCG/ML IJ SOLN WRAPPER (ANESTHESIA OSM ONLY)
INTRAMUSCULAR | Status: DC | PRN
Start: 2020-11-01 — End: 2020-11-01
  Administered 2020-11-01: 50 ug via INTRAVENOUS

## 2020-11-01 MED ORDER — NALOXONE HCL 0.4 MG/ML IJ SOLN
0.0400 mg | INTRAMUSCULAR | Status: DC | PRN
Start: 2020-11-01 — End: 2020-11-02

## 2020-11-01 MED ORDER — OXYCODONE HCL 5 MG OR TABS
5.0000 mg | ORAL_TABLET | ORAL | Status: DC | PRN
Start: 2020-11-01 — End: 2020-11-02

## 2020-11-01 MED ORDER — HYDRALAZINE HCL 20 MG/ML IJ SOLN
10.0000 mg | INTRAMUSCULAR | Status: DC | PRN
Start: 2020-11-01 — End: 2020-11-02
  Administered 2020-11-01: 10 mg via INTRAVENOUS

## 2020-11-01 MED ORDER — ACETAMINOPHEN 500 MG OR TABS
1000.0000 mg | ORAL_TABLET | Freq: Four times a day (QID) | ORAL | Status: DC | PRN
Start: 2020-11-01 — End: 2020-11-02

## 2020-11-01 MED ORDER — HYDRALAZINE HCL 20 MG/ML IJ SOLN
INTRAMUSCULAR | Status: AC
Start: 2020-11-01 — End: ?
  Filled 2020-11-01: qty 1

## 2020-11-01 MED ORDER — LACTATED RINGERS BOLUS
500.0000 mL | Freq: Once | INTRAVENOUS | Status: DC | PRN
Start: 2020-11-01 — End: 2020-11-02

## 2020-11-01 MED ORDER — LIDOCAINE HCL PF 2% IV/IJ SOSY/SOLN WRAPPER (ANESTHESIA OSM ONLY)
INTRAVENOUS | Status: DC | PRN
Start: 2020-11-01 — End: 2020-11-01
  Administered 2020-11-01: 80 mg via INTRAVENOUS

## 2020-11-01 MED ORDER — METOPROLOL TARTRATE 25 MG OR TABS
75.0000 mg | ORAL_TABLET | Freq: Once | ORAL | Status: DC
Start: 2020-11-01 — End: 2020-11-01

## 2020-11-01 MED ORDER — ACETAMINOPHEN 325 MG OR TABS
325.0000 mg | ORAL_TABLET | ORAL | Status: DC | PRN
Start: 2020-11-01 — End: 2020-11-02

## 2020-11-01 MED ORDER — FENTANYL CITRATE (PF) 100 MCG/2ML IJ SOLN
12.5000 ug | INTRAMUSCULAR | Status: DC | PRN
Start: 2020-11-01 — End: 2020-11-02

## 2020-11-01 MED ORDER — ONDANSETRON HCL 4 MG/2ML IJ SOLN
4.0000 mg | INTRAMUSCULAR | Status: DC | PRN
Start: 2020-11-01 — End: 2020-11-02

## 2020-11-01 MED ORDER — GLYCOPYRROLATE 0.2 MG/ML IJ SOLN
0.2000 mg | INTRAMUSCULAR | Status: DC | PRN
Start: 2020-11-01 — End: 2020-11-02

## 2020-11-01 MED ORDER — LACTATED RINGERS IV SOLN
INTRAVENOUS | Status: DC | PRN
Start: 2020-11-01 — End: 2020-11-01

## 2020-11-01 MED ORDER — HYDRALAZINE HCL 20 MG/ML IJ SOLN
5.0000 mg | INTRAMUSCULAR | Status: DC | PRN
Start: 2020-11-01 — End: 2020-11-02
  Administered 2020-11-01: 5 mg via INTRAVENOUS

## 2020-11-01 MED ORDER — MIDAZOLAM HCL 2 MG/2ML IJ SOLN
INTRAMUSCULAR | Status: DC | PRN
Start: 2020-11-01 — End: 2020-11-01
  Administered 2020-11-01: 2 mg via INTRAVENOUS

## 2020-11-01 MED ORDER — PROPOFOL 10 MG/ML IV EMUL WRAPPER (OSM ONLY)
INTRAVENOUS | Status: DC | PRN
Start: 2020-11-01 — End: 2020-11-01
  Administered 2020-11-01: 50 mg via INTRAVENOUS
  Administered 2020-11-01: 150 ug/kg/min via INTRAVENOUS

## 2020-11-01 NOTE — Discharge Instructions (Signed)
[facility]: After Your Guided Core Biopsy  Self-care guidelines     What To expect  Your procedure site may be sore or tender as the local anesthesia wears off.  Your skin may be stained at the biopsy site. This is from the disinfectant, it will wash off.     Activity  Relax and do only light activity for the rest of the day.    For 24 Hours  Do not shower or get the biopsy site wet.  Do not lift or push anything that weighs more than 10 pounds (a gallon of milk weighs almost 10 pounds).    Self Care   Remove the bandage about 24 hours after your biopsy.  After you remove the bandage, you may shower or take a bath.  Do not scrub the site, wash the site gently and pat it dry.  Watch for signs of infection  Redness, swelling, warmth, or pus at the biopsy site.  Fever higher than 100.4F (38C)    Anticoagulation Guidelines   Resume your anticoagulation medication Not applicable.    If You Have Questions or Concerns:  Call the clinic between 7am and 5pm if you have any of the following:  Mild fever, pain, redness, swelling, or dizziness  Any other non-urgent questions or concerns    Who to Call:    [facility]  Weekdays from 8am to 5pm:  Non Vascular Interventions................................[phone]  Advance Practitioner Provider................[phone]  Procedure Coordinator...........................[phone]  After hours, on weekends or holidays, call [phone] and ask to page the IR Resident on call.    [facility]  Weekdays from 8am to 5pm, call ...........................[phone]  After hours, on weekends or holidays, call [phone] and ask to page the IR Resident on call.    [facility]  Weekdays from 8am to 5pm, call............................[phone]  After hours, on weekends or holidays, call [phone] and ask to page the IR Resident on call.    Urgent Health Concerns  Call 911 or go to the nearest emergency room right away if:  You have  heavy bleeding at the procedure site  Your chest pain or tightness gets worse  You feel very dizzy, lightheaded, or short of breath  Your procedure site suddenly gets firm or very painful    Who interprets the results and how do I get them?  A radiologist who specializes in ultrasound will review the images and send the report to the provider   who referred you for the exam. The radiologist may speak with you about early findings when your exam   is over. Your own provider will review the results and speak with you about them. You and your provider   will then decide the next step, such as treatment for a problem, as needed. You may also read your results  On your MyChart page. If you need copies of your images on disc, call [phone].    Questions?  Your questions are important. Call your doctor or healthcare provider if you have any questions or concerns.  [facility] services  [phone]  [facility] imaging services  [phone]  [facility]  [phone]

## 2020-11-01 NOTE — Procedure Nursing Note (Signed)
IR Post-Procedure Note    Sean Oliver tolerated Kidney mass biopsy without immediate complications. Procedure performed by Barnie Del, MD & Coral Spikes, MD.    Lab Results   Component Value Date    WBC 6.38 10/31/2020    HEMOGLOBIN 15.4 10/31/2020    HEMATOCRIT 47 10/31/2020    PLATELET 314 10/31/2020    SODIUM 141 07/16/2020    POTASSIUM 4.1 07/16/2020    BUN 16 07/16/2020    CREATININE 1.49 (H) 07/16/2020    GLUCOSE 207 (H) 07/16/2020    INR 1.0 10/31/2020    PROTIME 12.7 10/31/2020    AST 31 07/17/2019    ALT 30 07/17/2019    ALK 113 07/17/2019    ALBUMIN 4.0 07/17/2019    BILIRUBN 0.5 07/17/2019       Patient was under anesthesia care, please see anesthesia charting/flowsheet for more details. Dressing to puncture site C/D/I. Report given at bedside to PACU RN.     Discharged from IR to PACU by IR RN and anesthesia provider. Patient escort: Family member (Wife Asencion Partridge) and phone #: [phone].    BP (!) 159/90   Pulse (!) 49   Temp 36.8 C (Temporal)   Resp 14   SpO2 98%      _0  Procedure end time: 1335   _1  Post procedure orders verified

## 2020-11-01 NOTE — Anesthesia Postprocedure Evaluation (Addendum)
Patient: Sean Oliver    Procedure Summary     Date: 11/01/20 Room / Location: [facility] Ultrasound; [facility]iology    Anesthesia Start: 7353 Anesthesia Stop: 1354    Procedure: US RENAL NEEDLE BX PERC Diagnosis:       Renal mass      (renal mass biopsy)    Scheduled Providers: Lauralyn Primes, MD; Dorena Bodo, CRNA; [place], Deborha Payment, MD; Haynes Hoehn, Dionicio Stall, RN; Larence Penning, MD Responsible Provider: Jerlyn Ly, MD    Anesthesia Type: MAC ASA Status: Not recorded        Final Anesthesia Type: MAC    Vitals Value Taken Time   BP 153/97 11/01/20 1345   Temp 36.6 11/01/20 1345   Pulse 58 11/01/20 1345   SpO2 99 11/01/20 1345       Place of evaluation: PACU    Patient participation: patient participated    Level of consciousness: fully conscious    Patient pain control satisfaction: patient is satisfied with level of pain control    Airway patency: patent    Cardiovascular status during assessment: stable    Respiratory status during assessment: breathing comfortably    Anesthetic complications: no    Intravascular volume status assessment: euvolemic    Nausea / vomiting: patient is not experiencing nausea      Planned post-operative disposition at time of assessment: hospital discharge    Additional comments: Hypertensive, near pre-op values. Received hydralazine for BP control with mild improvement. Patient normally takes three antihypertensives, but has not taken them for nearly two days. Discussed medication management, and patient will resume home BP medications this evening as normally scheduled.

## 2020-11-01 NOTE — Brief Procedure Note (Signed)
Ultrasound-guided Targeted Left Renal Biopsy    Procedure description: Detailed radiology note to follow. Using ultrasound guidance an appropriate area over the left kidney was marked. The area was prepped and draped in a usual sterile fashion. 1% lidocaine was used for local anesthetic. Sedation was provided by anesthesia service. A 17-gauage coaxial needle was advanced into the left kidney and Three x 18-gauge core samples were obtained. Specimen was sent to lab for analysis.    OPERATORS:  Larence Penning, MD    SUPERVISING PHYSICIAN:  Dr. Coral Spikes    SAMPLES AND SPECIMENS:  Sent for pathology    COMPLICATIONS:  There were no immediate complications.    ESTIMATED BLOOD LOSS:  < 25 ml    DIAGNOSTIC STATUS:  Post procedure diagnosis: Pending

## 2020-11-01 NOTE — Procedure Nursing Note (Signed)
VSS throughout PACU recovery with the exception of persistent hypertension. Pt is chronically hypertensive. Pt is asymptomatic. Blood pressure discussed with Anesthesia MD Dr. Tobie Poet who assessed pt multiple times at bedside. Pt is OK to discharge as per MD. Pt transitioned easily to room air. No pain or nausea reported. Pt tolerating ice chips. Pt sleeps comfortably, awakens easily and is oriented x 4, moves all extremities. Pt able to safely ambulate to bathroom and void. Discharge instructions reviewed with pt and pt's spouse. All questions answered. All belongings accounted for. PACU discharge criteria met.

## 2020-11-04 NOTE — Addendum Note (Signed)
Encounter addended by: Jackey Loge on: 11/04/2020 8:58 AM   Actions taken: SmartForm saved

## 2020-11-04 NOTE — Addendum Note (Signed)
Encounter addended by: Jackey Loge on: 11/04/2020 8:57 AM   Actions taken: Charge Capture section accepted

## 2020-11-05 LAB — PATHOLOGY, SURGICAL

## 2020-11-14 ENCOUNTER — Ambulatory Visit
Admission: RE | Admit: 2020-11-14 | Discharge: 2020-11-14 | Disposition: A | Discharge status: Home / Self Care | Payer: No Typology Code available for payment source | Attending: Diagnostic Radiology | Admitting: Diagnostic Radiology

## 2020-11-14 DIAGNOSIS — N2889 Other specified disorders of kidney and ureter: Secondary | ICD-10-CM | POA: Insufficient documentation

## 2020-11-14 DIAGNOSIS — R053 Chronic cough: Secondary | ICD-10-CM

## 2020-11-16 ENCOUNTER — Encounter (INDEPENDENT_AMBULATORY_CARE_PROVIDER_SITE_OTHER): Payer: Self-pay | Admitting: Physician Assistant

## 2020-11-18 ENCOUNTER — Telehealth (INDEPENDENT_AMBULATORY_CARE_PROVIDER_SITE_OTHER): Payer: Self-pay | Admitting: Physician Assistant

## 2020-11-18 DIAGNOSIS — R911 Solitary pulmonary nodule: Secondary | ICD-10-CM

## 2020-11-18 DIAGNOSIS — N2889 Other specified disorders of kidney and ureter: Secondary | ICD-10-CM

## 2020-11-18 DIAGNOSIS — R053 Chronic cough: Secondary | ICD-10-CM

## 2020-11-18 DIAGNOSIS — R059 Cough, unspecified: Secondary | ICD-10-CM

## 2020-11-18 DIAGNOSIS — J439 Emphysema, unspecified: Secondary | ICD-10-CM

## 2020-11-18 MED ORDER — ALBUTEROL SULFATE HFA 108 (90 BASE) MCG/ACT IN AERS
2.0000 | INHALATION_SPRAY | RESPIRATORY_TRACT | 2 refills | Status: DC | PRN
Start: 2020-11-18 — End: 2021-05-01

## 2020-11-18 NOTE — Telephone Encounter (Signed)
RETURN CALL: Voicemail - Detailed Message      SUBJECT:  General Message     MESSAGE: Pt is calling with questions regarding test results and next steps. Please contact and assist, thank you!

## 2020-11-18 NOTE — Telephone Encounter (Signed)
Called to pt.   Discussed CT results  +mild emphysema, mild coronary calcifications, RUL new pulm nodule needing short-term f/u.    Advised quit all smoking (still some cigar smoking)  For cough, use albuterol q4hr  Will proceed with PFT and CT in 2-81m from now.  F/u in interim for any new or concerning Sx.

## 2020-11-18 NOTE — Telephone Encounter (Signed)
Routing to provider to please advise on test results. -Thanks!

## 2020-11-25 ENCOUNTER — Ambulatory Visit (INDEPENDENT_AMBULATORY_CARE_PROVIDER_SITE_OTHER): Payer: No Typology Code available for payment source | Admitting: Urology

## 2020-11-25 VITALS — BP 139/89 | HR 61 | Temp 98.9°F | Resp 14

## 2020-11-25 DIAGNOSIS — C642 Malignant neoplasm of left kidney, except renal pelvis: Secondary | ICD-10-CM

## 2020-11-25 NOTE — Progress Notes (Signed)
UROLOGY FOLLOW-UP CLINIC VISIT      ID/CC:    Renal mass    History of Present Illness:   Lamoyne Hessel is a 60 year old male who returns for follow-up of a renal mass following renal mass biopsy.    He has multiple medical comorbidities including CKD, type 2 diabetes, hypertension, and diverticulitis status post colectomy x2, with a history of incisional hernia and closure with mesh.  He is on CPAP for OSA.  We have followed him for an incidentally diagnosed solid enhancing left renal mass that was initially 2.2 cm on CT in 2018, though remeasured a few days later is 1.8 cm on MRI.  This was stable in size in 2019, and 2020.      Interval History:  After last discussion in April 2022 he was interested in percutaneous cryoablation as an option versus retroperitoneal partial nephrectomy.  Ultimately it was decided to perform a renal mass biopsy prior to any decision on intervention.  He had an uneventful renal mass biopsy.      Past Medical History:   Diagnosis Date   . Cancer ([place])    . Diabetes mellitus ([place])    . Hypertension    . Kidney disease    . Lipidemia    . Lung disease    . Type II or unspecified type diabetes mellitus without mention of complication, not stated as uncontrolled        Past Surgical History:   Procedure Laterality Date   . left eye surgery x 5     . PR RPR INGUN HERNIA SLIDING ANY AGE      many years ago.  bilateral.       Active Medications:      Current Outpatient Medications:   .  albuterol HFA 108 (90 Base) MCG/ACT inhaler, Inhale 2 puffs by mouth every 4 hours as needed (cough or shortness of breath)., Disp: 8 g, Rfl: 2  .  atorvastatin 40 MG tablet, TAKE ONE TABLET BY MOUTH ONE TIME DAILY , Disp: 90 tablet, Rfl: 3  .  BD Pen Needle Nano U/F 32G X 4 MM miscellaneous, Inject 1 each under the skin 4 times a day. Use one pen needle to inject insulin four times daily, Disp: 400 each, Rfl: 1  .  blood glucose test strip, 1 strip 3 times a day. Use to check blood sugar., Disp: 100  each, Rfl: 11  .  fluticasone propionate 50 MCG/ACT nasal spray, Spray 2 sprays into each nostril daily., Disp: 16 g, Rfl: 6  .  glucometer (OneTouch Verio) kit, Use to monitor blood glucose., Disp: 1 each, Rfl: 0  .  insulin GLARGINE (Basaglar KwikPen) 100 UNIT/ML pen-injector, INJECT 20 UNITS UNDER THE SKIN EVERY MORNING, Disp: 15 mL, Rfl: 3  .  Jardiance 25 MG tablet, TAKE ONE TABLET BY MOUTH ONE TIME DAILY, Disp: 90 tablet, Rfl: 0  .  lancet (OneTouch Delica) 71I, Use 1 each 3 times a day., Disp: 100 each, Rfl: 11  .  losartan 50 MG tablet, Take 1 tablet (50 mg) by mouth daily., Disp: 90 tablet, Rfl: 3  .  metFORMIN 1000 MG tablet, TAKE 1 TABLET BY MOUTH TWICE DAILY WITH MEALS, Disp: 180 tablet, Rfl: 0  .  metoprolol succinate ER 25 MG 24 hr tablet, TAKE TWO TABLETS BY MOUTH DAILY (DO NOT CRUSH OR CHEW TABLETS), Disp: 180 tablet, Rfl: 3  .  NIFEdipine ER 30 MG 24 hr tablet, TAKE TWO TABLETS  BY MOUTH TWICE DAILY , Disp: 360 tablet, Rfl: 3  .  sildenafil 100 MG tablet, Take 1 tablet (100 mg) by mouth daily as needed for erectile dysfunction., Disp: 30 tablet, Rfl: 6    Allergies:    Allergies as of 11/25/2020 - Reviewed 11/25/2020   Allergen Reaction Noted   . 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        We discussed his new diagnosis of chromophobe renal cell carcinoma.  Overall he has had a fairly stable renal mass and now we have histology to go with that.  He was recently leaning towards surgery after surveillance for few years.  At this time he has several questions regarding whether surveillance is still reasonable for the time  being.      ASSESSMENT/PLAN:  Diondre Pulis is a 60 year old male with a new diagnosis of left small chromophobe renal cell carcinoma.  We will plan for continued active surveillance for the time being per his preference.  We will plan for an MRI at the end of the year, approximately 8 months from the last.     I have spent 30 minutes on this visit today including chart review prior to the visit, face to face time with the patient, and documentation and coordination of care after the visit.

## 2020-12-11 ENCOUNTER — Other Ambulatory Visit (INDEPENDENT_AMBULATORY_CARE_PROVIDER_SITE_OTHER): Payer: Self-pay | Admitting: Family Medicine

## 2020-12-11 DIAGNOSIS — N529 Male erectile dysfunction, unspecified: Secondary | ICD-10-CM

## 2020-12-13 ENCOUNTER — Encounter (HOSPITAL_BASED_OUTPATIENT_CLINIC_OR_DEPARTMENT_OTHER): Payer: No Typology Code available for payment source

## 2020-12-13 MED ORDER — SILDENAFIL CITRATE 100 MG OR TABS
100.0000 mg | ORAL_TABLET | Freq: Every day | ORAL | 2 refills | Status: DC | PRN
Start: 2020-12-13 — End: 2021-06-18

## 2020-12-20 ENCOUNTER — Other Ambulatory Visit (INDEPENDENT_AMBULATORY_CARE_PROVIDER_SITE_OTHER): Payer: Self-pay | Admitting: Family Medicine

## 2020-12-20 DIAGNOSIS — Z794 Long term (current) use of insulin: Secondary | ICD-10-CM

## 2020-12-20 DIAGNOSIS — N1832 Chronic kidney disease, stage 3b: Secondary | ICD-10-CM

## 2020-12-20 DIAGNOSIS — E1122 Type 2 diabetes mellitus with diabetic chronic kidney disease: Secondary | ICD-10-CM

## 2020-12-24 MED ORDER — JARDIANCE 25 MG OR TABS
ORAL_TABLET | ORAL | 1 refills | Status: DC
Start: 2020-12-24 — End: 2021-07-01

## 2021-01-25 ENCOUNTER — Other Ambulatory Visit (INDEPENDENT_AMBULATORY_CARE_PROVIDER_SITE_OTHER): Payer: Self-pay | Admitting: Physician Assistant

## 2021-01-25 DIAGNOSIS — Z794 Long term (current) use of insulin: Secondary | ICD-10-CM

## 2021-01-25 DIAGNOSIS — E1122 Type 2 diabetes mellitus with diabetic chronic kidney disease: Secondary | ICD-10-CM

## 2021-01-25 DIAGNOSIS — N1832 Long term (current) use of insulin: Secondary | ICD-10-CM

## 2021-01-27 MED ORDER — METFORMIN HCL 1000 MG OR TABS
ORAL_TABLET | ORAL | 0 refills | Status: DC
Start: 2021-01-27 — End: 2021-05-05

## 2021-02-07 ENCOUNTER — Ambulatory Visit (HOSPITAL_BASED_OUTPATIENT_CLINIC_OR_DEPARTMENT_OTHER)
Admit: 2021-02-07 | Discharge: 2021-02-07 | Disposition: A | Discharge status: Home / Self Care | Payer: No Typology Code available for payment source | Source: Home / Self Care

## 2021-02-07 ENCOUNTER — Ambulatory Visit
Admission: RE | Admit: 2021-02-07 | Discharge: 2021-02-07 | Disposition: A | Discharge status: Home / Self Care | Payer: No Typology Code available for payment source | Attending: Diagnostic Radiology | Admitting: Diagnostic Radiology

## 2021-02-07 DIAGNOSIS — C642 Malignant neoplasm of left kidney, except renal pelvis: Secondary | ICD-10-CM

## 2021-02-07 DIAGNOSIS — R911 Solitary pulmonary nodule: Secondary | ICD-10-CM | POA: Insufficient documentation

## 2021-02-07 DIAGNOSIS — R053 Chronic cough: Secondary | ICD-10-CM | POA: Insufficient documentation

## 2021-02-07 DIAGNOSIS — J439 Emphysema, unspecified: Secondary | ICD-10-CM | POA: Insufficient documentation

## 2021-02-07 DIAGNOSIS — N2889 Other specified disorders of kidney and ureter: Secondary | ICD-10-CM | POA: Insufficient documentation

## 2021-02-07 MED ORDER — GADOTERIDOL 279.3 MG/ML IV SOLN
20.0000 mL | Freq: Once | INTRAVENOUS | Status: AC | PRN
Start: 2021-02-07 — End: 2021-02-07
  Administered 2021-02-07: 10 mmol via INTRAVENOUS

## 2021-03-31 ENCOUNTER — Other Ambulatory Visit (HOSPITAL_BASED_OUTPATIENT_CLINIC_OR_DEPARTMENT_OTHER): Payer: Self-pay

## 2021-03-31 DIAGNOSIS — N1831 Chronic kidney disease, stage 3a: Secondary | ICD-10-CM

## 2021-04-07 ENCOUNTER — Ambulatory Visit: Payer: No Typology Code available for payment source | Attending: Nephrology | Admitting: Nephrology

## 2021-04-07 VITALS — BP 149/92 | HR 58 | Wt 212.0 lb

## 2021-04-07 DIAGNOSIS — I10 Essential (primary) hypertension: Secondary | ICD-10-CM | POA: Insufficient documentation

## 2021-04-07 DIAGNOSIS — N2889 Other specified disorders of kidney and ureter: Secondary | ICD-10-CM | POA: Insufficient documentation

## 2021-04-07 DIAGNOSIS — Z794 Long term (current) use of insulin: Secondary | ICD-10-CM | POA: Insufficient documentation

## 2021-04-07 DIAGNOSIS — N1832 Chronic kidney disease, stage 3b: Secondary | ICD-10-CM | POA: Insufficient documentation

## 2021-04-07 DIAGNOSIS — E1122 Type 2 diabetes mellitus with diabetic chronic kidney disease: Secondary | ICD-10-CM | POA: Insufficient documentation

## 2021-04-07 NOTE — Progress Notes (Signed)
NEPHROLOGY CLINIC FOLLOW UP NOTE  04/07/2021            REFERRING PHYSICIAN:   Morhaf Al Achkar     INITIAL CONSULT DATE: 05/28/2017      Past Medical History:   Diagnosis Date    Cancer ([place])     Diabetes mellitus ([place])     Hypertension     Kidney disease     Lipidemia     Lung disease     Type II or unspecified type diabetes mellitus without mention of complication, not stated as uncontrolled      Past Surgical History:   Procedure Laterality Date    left eye surgery x 5      PR RPR INGUN HERNIA SLIDING ANY AGE      many years ago.  bilateral.        HISTORY OF PRESENT ILLNESS: Mr. Hickam is a 60 year old male with past medical history significant for the above mentioned problems.    CC: Follow up on CKD and Hypertension     Based on verbal report, past medical history is significant for:    Diabetes mellitus of at least 15 years duration, though has been very well controlled over this period of time.  He was initially started off on insulin but subsequently has been switched to metformin only with good glycemic control.  By his report, he does not know of any end organ damage including absence of any diabetic retinopathy or neuropathy.  Also has long-standing hypertension of at least 20 years duration.  While he has not checked his pressures regularly at home, he reports that for the most part at doctors visits he was told that the blood pressure is only marginally elevated.  Prolonged time he was on combination lisinopril with hydrochlorothiazide, that was changed subsequently to alternative agents.  One reasoning for stopping lisinopril was to determine if it would lead to an improvement in creatinine/GFR.  However, over the last few months patient has been experiencing uncontrolled hypertension with most readings in the 160s-170s/90s-100 range.  He is not specifically expressing any symptoms related to these uncontrolled readings and denies any history of TIAs/CVAs.  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  Also had considerable issues with diverticulitis leading to laparotomy with limited resection as well in the past.    During the recent follow-ups with primary care physician and in effort to undertake a diagnostic evaluation for his chronic kidney disease, both urinary and imaging studies were undertaken.  Urinalyses now revealing increased albuminuria while imaging studies including ultrasound, CT scan and MRI also indicating around 2 cm left-sided interpolar mass most compatible with renal cell carcinoma.  He has not experienced any specific flank pains, dysuria, renal colic or hematuria.  Similarly no major change in urinary habit and no problems with dependent edema.    No history of nephrolithiasis, recurrent urinary tract infections or pyelonephritis.  Only occasional use of NSAIDs with no over-the-counter/herbal supplement use.      Hospitalization between 2/1-07/19/2019 for hyperglycemic hyperosmolar syndrome (HHS) as well as accelerated hypertension.  Since that hospitalization is management has been modified with initiation of empagliflozin and insulin.  He was followed by diabetes clinic after that.  For hypertension, lisinopril was previously switched to losartan on the complaints of a cough.       Interval History:  Has not been checking his blood pressures at home; but previous  clinic reading was well controlled before. Reports that he has been very compliant with his medications that his wife sets up for him. He also reports that there has been no change in his medications or dosages in the interim.  Denying any headaches, visual changes or postural problems.  No history of dependent edema.    Since initiation of empagliflozin, he does not describe any significant change in his urinary output.  Has not experienced any issues with urinary tract infections or other genital problems.     Previously encountered  nocturia has dissipated now that he is using CPAP regularly.      Most significantly in consultation with Dr. 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 He has opted for close surveillance with MRI scans.     He has not experienced any dyspnea, chest pain, visual problems or dependent edema.          ALLERGIES:    Review of patient's allergies indicates:  Allergies   Allergen Reactions    Peanuts Anaphylaxis       HOME MEDICATIONS:      Current Outpatient Medications:     albuterol HFA 108 (90 Base) MCG/ACT inhaler, Inhale 2 puffs by mouth every 4 hours as needed (cough or shortness of breath)., Disp: 8 g, Rfl: 2    atorvastatin 40 MG tablet, TAKE ONE TABLET BY MOUTH ONE TIME DAILY , Disp: 90 tablet, Rfl: 3    BD Pen Needle Nano U/F 32G X 4 MM miscellaneous, Inject 1 each under the skin 4 times a day. Use one pen needle to inject insulin four times daily, Disp: 400 each, Rfl: 1    blood glucose test strip, 1 strip 3 times a day. Use to check blood sugar., Disp: 100 each, Rfl: 11    fluticasone propionate 50 MCG/ACT nasal spray, Spray 2 sprays into each nostril daily., Disp: 16 g, Rfl: 6    glucometer (OneTouch Verio) kit, Use to monitor blood glucose., Disp: 1 each, Rfl: 0    insulin GLARGINE (Basaglar KwikPen) 100 UNIT/ML pen-injector, INJECT 20 UNITS UNDER THE SKIN EVERY MORNING, Disp: 15 mL, Rfl: 3    Jardiance 25 MG tablet, TAKE ONE TABLET BY MOUTH ONCE DAILY, Disp: 90 tablet, Rfl: 1    lancet (OneTouch Delica) 54Y, Use 1 each 3 times a day., Disp: 100 each, Rfl: 11    losartan 50 MG tablet, Take 1 tablet (50 mg) by mouth daily., Disp: 90 tablet, Rfl: 3    metFORMIN 1000 MG tablet, TAKE 1 TABLET BY MOUTH TWICE DAILY WITH MEALS, Disp: 180 tablet, Rfl: 0    metoprolol succinate ER 25 MG 24 hr tablet, TAKE TWO TABLETS BY MOUTH DAILY (DO NOT CRUSH OR CHEW TABLETS), Disp: 180 tablet, Rfl: 3    NIFEdipine ER 30 MG 24 hr tablet, TAKE TWO  TABLETS BY MOUTH TWICE DAILY , Disp: 360 tablet, Rfl: 3    sildenafil 100 MG tablet, Take 1 tablet (100 mg) by mouth daily as needed for erectile dysfunction, Disp: 30 tablet, Rfl: 2        PHYSICAL EXAM:     Vitals Signs: BP (!) 149/92    Pulse (!) 58    Wt 96.2 kg (212 lb)    SpO2 99%    BMI 28.75 kg/m .    Constitutional: Sitting comfortably in chair, in no apparent distress.  Eyes:  Pupils normal size, EOM intact  Mouth/Throat:  Mucous membranes are moist,  no oropharyngeal lesions  Neck: No JVD or carotid bruits  Lungs: No rhonchi or creptitus  Heart: S1, S2 with II/VI ESM, no rubs/ gallops  Abdomen: Soft, nontender; bowel sounds normal  Extremities: No pretibial or pedal edema,    Neuro: Alert O X3, no focal deficiits  Skin: No rash        DIAGNOSTIC STUDIES: Has NOT gotten any labs in the interim.        04/19/2017 11:01 07/05/2017 16:47 03/07/2019 14:46 07/19/2019 07/16/2020   Sodium 141 144 143 133 141   Potassium 3.8 3.9 4.0 3.9 4.1   Chloride 106 107 108 99 106   Carbon Dioxide, Total _0 Anion Gap _1 Glucose 102 117 136 (H) 332 207   Urea Nitrogen _2 (H) 17 16   Creatinine 1.61 (H) 1.51 (H) 1.62 (H) 1.55 1.49   GFR, Calc, African American 54 (L) 59 (L)      eGFR, Calculated   47 (L) 49 51   Calcium 9.2 9.6 9.2 9.1 9.1   Phosphate  3.5 2.9                  MRI 09/14/2020:    Compared to MRI abdomen 03/20/2019:    1. Interval growth of hyperenhancing left interpolar mass now measuring 2.3 cm, previously 1.9 cm. 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 No evidence of local or vascular invasion.    2. Similar indeterminant 1 cm segment 6 hepatic lesion; likely benign given long-term stability.      Renal Mass Biopsy 11/05/2020:  Final Diagnosis   A) Left renal mass, biopsy:                - Chromophobe renal cell carcinoma.          ASSESSMENT AND PLAN:    -Chronic Kidney Disease Stage 3A  Review of available labs since at least 2015 show a  creatinine ranging from 1.27-1.61 (and one value obtained through [place] from 04/28/1996 of 1.72m/dL).  This indicates long-standing element of chronic kidney disease with eGFR likely in the ~ 55 mL/minute range albeit a more or less stable course over this period of time with minimal progression.  Last serum creatinine at 1.49 with eGFR  569mminute (07/16/2020), that indicates overall stable status without noticeable progression. Has not had repeat labs in the interim and will [facility] labs [facility]  Albuminuria continues with variable severity 38727m- 732m79m  Continues on losartan with empagliflozin.  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  Ultrasound of the kidneys is instructive in this regard indicating increased echogenicity from interstitial fibrosis.  Previously, a monoclonal disorder was ruled out with urine and serum electrophoresis studies as well as free light chain assays.      Needs continued aggressive risk factor modification (see below).    -Hypertension, Primary  Unfortunately patient does not check home readings.  Given stable clinic reading back in March 2022, continued on current regimen of nifedipine XL 60 mg twice daily, metoprolol extended release 50 mg daily and losartan 50 mg daily.  I have instructed him to start checking his blood pressures regularly at home for additional antihypertensive modification.       -  Diabetes mellitus type 2, uncontrolled  After course of relatively stable control of diabetes mellitus, was hospitalized in early February 2021 with hyperglycemic hyperosmolar syndrome requiring initiation of insulin.  Since that time he has also been started on empagliflozin.  Last hemoglobin A1c at 13%.    Continue losartan for maximum anti-proteinuric effect.    I  have instructed him to re-establish a follow up with his PCP for ongoing glycemic management. Will add a HbA1c to the labs from today.    -Renal cell carcinoma   Imaging studies showed a 2 cm lesion in the interpolar region of the left kidney. On the basis of recent MRI 09/14/2020 there appeared to be slight progression to now 2.3 cm lesion.    In consultation with Dr. 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 He has opted for close surveillance with MRI scans.         Return to clinic in 6 months with CBC, renal function panel, urinalysis with albumin to creatinine ratio, iPTH.      Johnathan Hausen, MD, 04/07/2021  Division of Nephrology  [facility]  [place], [place]

## 2021-04-08 ENCOUNTER — Other Ambulatory Visit: Payer: Self-pay

## 2021-04-14 ENCOUNTER — Emergency Department (HOSPITAL_COMMUNITY)
Admission: EM | Admit: 2021-04-14 | Discharge: 2021-04-14 | Disposition: A | Discharge status: Home / Self Care | Payer: Managed Care, Other (non HMO) | Attending: Emergency Medicine | Admitting: Emergency Medicine

## 2021-04-14 ENCOUNTER — Encounter (HOSPITAL_COMMUNITY): Payer: Self-pay | Admitting: Emergency Medicine

## 2021-04-14 ENCOUNTER — Other Ambulatory Visit: Payer: Self-pay

## 2021-04-14 ENCOUNTER — Emergency Department (HOSPITAL_COMMUNITY): Payer: Managed Care, Other (non HMO)

## 2021-04-14 DIAGNOSIS — R42 Dizziness and giddiness: Secondary | ICD-10-CM | POA: Diagnosis not present

## 2021-04-14 DIAGNOSIS — E119 Type 2 diabetes mellitus without complications: Secondary | ICD-10-CM | POA: Diagnosis not present

## 2021-04-14 DIAGNOSIS — F17210 Nicotine dependence, cigarettes, uncomplicated: Secondary | ICD-10-CM | POA: Diagnosis not present

## 2021-04-14 HISTORY — DX: Type 2 diabetes mellitus without complications: E11.9

## 2021-04-14 LAB — CBC WITH DIFFERENTIAL/PLATELET
Abs Immature Granulocytes: 0.05 10*3/uL (ref 0.00–0.07)
Basophils Absolute: 0.1 10*3/uL (ref 0.0–0.1)
Basophils Relative: 1 %
Eosinophils Absolute: 0.1 10*3/uL (ref 0.0–0.5)
Eosinophils Relative: 1 %
HCT: 49.5 % (ref 39.0–52.0)
Hemoglobin: 17.6 g/dL — ABNORMAL HIGH (ref 13.0–17.0)
Immature Granulocytes: 0 %
Lymphocytes Relative: 17 %
Lymphs Abs: 2.1 10*3/uL (ref 0.7–4.0)
MCH: 32.8 pg (ref 26.0–34.0)
MCHC: 35.6 g/dL (ref 30.0–36.0)
MCV: 92.4 fL (ref 80.0–100.0)
Monocytes Absolute: 0.8 10*3/uL (ref 0.1–1.0)
Monocytes Relative: 7 %
Neutro Abs: 9.1 10*3/uL — ABNORMAL HIGH (ref 1.7–7.7)
Neutrophils Relative %: 74 %
Platelets: 135 10*3/uL — ABNORMAL LOW (ref 150–400)
RBC: 5.36 MIL/uL (ref 4.22–5.81)
RDW: 12.1 % (ref 11.5–15.5)
WBC: 12.1 10*3/uL — ABNORMAL HIGH (ref 4.0–10.5)
nRBC: 0 % (ref 0.0–0.2)

## 2021-04-14 LAB — COMPREHENSIVE METABOLIC PANEL
ALT: 20 U/L (ref 0–44)
AST: 15 U/L (ref 15–41)
Albumin: 3.9 g/dL (ref 3.5–5.0)
Alkaline Phosphatase: 76 U/L (ref 38–126)
Anion gap: 8 (ref 5–15)
BUN: 12 mg/dL (ref 6–20)
CO2: 20 mmol/L — ABNORMAL LOW (ref 22–32)
Calcium: 8.5 mg/dL — ABNORMAL LOW (ref 8.9–10.3)
Chloride: 107 mmol/L (ref 98–111)
Creatinine, Ser: 0.74 mg/dL (ref 0.61–1.24)
GFR, Estimated: >60 mL/min (ref >60)
Glucose, Bld: 163 mg/dL — ABNORMAL HIGH (ref 70–99)
Potassium: 3.9 mmol/L (ref 3.5–5.1)
Sodium: 135 mmol/L (ref 135–145)
Total Bilirubin: 0.8 mg/dL (ref 0.3–1.2)
Total Protein: 7.3 g/dL (ref 6.5–8.1)

## 2021-04-14 MED ORDER — MECLIZINE HCL 25 MG PO TABS: 25.0000 mg | ORAL_TABLET | Freq: Three times a day (TID) | ORAL | 0 refills | Status: AC | PRN

## 2021-04-14 MED ORDER — MECLIZINE HCL 12.5 MG PO TABS
25.0000 mg | ORAL_TABLET | Freq: Once | ORAL | Status: AC
  Administered 2021-04-14: 25 mg via ORAL
  Filled 2021-04-14: qty 2

## 2021-04-14 MED ORDER — SODIUM CHLORIDE 0.9 % IV BOLUS
500.0000 mL | Freq: Once | INTRAVENOUS | Status: AC
  Administered 2021-04-14: 500 mL via INTRAVENOUS

## 2021-04-14 MED ORDER — ONDANSETRON HCL 4 MG/2ML IJ SOLN
4.0000 mg | Freq: Once | INTRAMUSCULAR | Status: DC
  Filled 2021-04-14: qty 2

## 2021-04-14 NOTE — Discharge Instructions (Signed)
Follow-up with your doctor later this week for recheck. °

## 2021-04-14 NOTE — ED Triage Notes (Signed)
Pt arrives via [facility] d/t sudden onset of dizziness and generalized weakness all over while showering today around 1:30pm today. States he feels like he is still spinning when eyes closed. Denies fever, chill, sob, or chest pain. CBG 230 with EMS

## 2021-04-14 NOTE — ED Provider Notes (Signed)
[facility] EMERGENCY DEPARTMENT Provider Note   CSN: [id number] Arrival date & time: 04/14/21  1504     History Chief Complaint  Patient presents with   Dizziness    Robert Wong is a 60 y.o. male.  Patient complains of dizziness.  Whenever he gets up he feels dizzy  The history is provided by the patient and medical records. No language interpreter was used.  Dizziness Quality:  Head spinning Severity:  Mild Onset quality:  Sudden Timing:  Intermittent Progression:  Waxing and waning Chronicity:  New Context: bending over   Relieved by:  Nothing Worsened by:  Nothing Associated symptoms: no chest pain, no diarrhea and no headaches       Past Medical History:  Diagnosis Date   Diabetes mellitus without complication (HCC)     There are no problems to display for this patient.   Past Surgical History:  Procedure Laterality Date   FOOT SURGERY Left    HERNIA REPAIR         History reviewed. No pertinent family history.  Social History   Tobacco Use   Smoking status: Every Day    Packs/day: 1.00    Years: 25.00    Pack years: 25.00    Types: Cigarettes    Passive exposure: Never   Smokeless tobacco: Never  Substance Use Topics   Alcohol use: Not Currently   Drug use: Never    Home Medications Prior to Admission medications   Medication Sig Start Date End Date Taking? Authorizing Provider  meclizine (ANTIVERT) 25 MG tablet Take 1 tablet (25 mg total) by mouth 3 (three) times daily as needed for dizziness. 04/14/21  Yes Bethann Berkshire, MD    Allergies    Patient has no allergy information on record.  Review of Systems   Review of Systems  Constitutional:  Negative for appetite change and fatigue.  HENT:  Negative for congestion, ear discharge and sinus pressure.   Eyes:  Negative for discharge.  Respiratory:  Negative for cough.   Cardiovascular:  Negative for chest pain.  Gastrointestinal:  Negative for abdominal pain and diarrhea.   Genitourinary:  Negative for frequency and hematuria.  Musculoskeletal:  Negative for back pain.  Skin:  Negative for rash.  Neurological:  Positive for dizziness. Negative for seizures and headaches.  Psychiatric/Behavioral:  Negative for hallucinations.    Physical Exam Updated Vital Signs BP 111/77   Pulse (!) 57   Temp 98.4 F (36.9 C) (Oral)   Resp 16   Ht 5\' 11"  (1.803 m)   Wt 89.4 kg   SpO2 100%   BMI 27.49 kg/m   Physical Exam Vitals and nursing note reviewed.  Constitutional:      Appearance: He is well-developed.  HENT:     Head: Normocephalic.     Mouth/Throat:     Mouth: Mucous membranes are moist.  Eyes:     General: No scleral icterus.    Conjunctiva/sclera: Conjunctivae normal.     Comments: No nystagmus  Neck:     Thyroid: No thyromegaly.  Cardiovascular:     Rate and Rhythm: Normal rate and regular rhythm.     Heart sounds: No murmur heard.   No friction rub. No gallop.  Pulmonary:     Breath sounds: No stridor. No wheezing or rales.  Chest:     Chest wall: No tenderness.  Abdominal:     General: There is no distension.     Tenderness: There is no abdominal tenderness. There  is no rebound.  Musculoskeletal:        General: Normal range of motion.     Cervical back: Neck supple.  Lymphadenopathy:     Cervical: No cervical adenopathy.  Skin:    Findings: No erythema or rash.  Neurological:     Mental Status: He is alert and oriented to person, place, and time.     Motor: No abnormal muscle tone.     Coordination: Coordination normal.  Psychiatric:        Behavior: Behavior normal.    ED Results / Procedures / Treatments   Labs (all labs ordered are listed, but only abnormal results are displayed) Labs Reviewed  CBC WITH DIFFERENTIAL/PLATELET - Abnormal; Notable for the following components:      Result Value   WBC 12.1 (*)    Hemoglobin 17.6 (*)    Platelets 135 (*)    Neutro Abs 9.1 (*)    All other components within normal limits   COMPREHENSIVE METABOLIC PANEL - Abnormal; Notable for the following components:   CO2 20 (*)    Glucose, Bld 163 (*)    Calcium 8.5 (*)    All other components within normal limits    EKG EKG Interpretation  Date/Time:  Monday April 14 2021 18:06:07 EDT Ventricular Rate:  71 PR Interval:  170 QRS Duration: 90 QT Interval:  348 QTC Calculation: 379 R Axis:   56 Text Interpretation: Sinus rhythm Ventricular premature complex Probable anteroseptal infarct, old Confirmed by Milton Ferguson [phone]) on 04/14/2021 6:28:21 PM  Radiology MR BRAIN WO CONTRAST  Result Date: 04/14/2021 CLINICAL DATA:  Dizziness, non-specific EXAM: MRI HEAD WITHOUT CONTRAST TECHNIQUE: Multiplanar, multiecho pulse sequences of the brain and surrounding structures were obtained without intravenous contrast. COMPARISON:  None. FINDINGS: Brain: No acute infarction, hemorrhage, extra-axial collection or mass lesion. Generalized atrophy with moderate ventriculomegaly, probably ex vacuo. The callosal angle is not acute and there is no crowding of sulci at the vertex. The cerebral aqueduct is patent on sagittal imaging. Heterogeneous FLAIR hyperintensity in the third ventricle is favored to reflect pulsation artifact given unremarkable appearance on other sequences. Mild for age scattered T2 hyperintensities in the matter, nonspecific but compatible with chronic microvascular ischemic disease. Vascular: Major arterial flow voids are maintained at the skull base. Skull and upper cervical spine: Normal marrow signal. Sinuses/Orbits: Mild to moderate mucosal thickening the left maxillary sinus, left sphenoid sinus and scattered ethmoid air cells. Other: No mastoid effusions. IMPRESSION: 1. No evidence of acute abnormality. 2. Generalized atrophy with moderate ventriculomegaly, probably ex vacuo. 3. Mild chronic microvascular disease. 4. Mild-to-moderate paranasal sinus mucosal thickening. Electronically Signed   By: Margaretha Sheffield M.D.   On: 04/14/2021 17:51    Procedures Procedures   Medications Ordered in ED Medications  ondansetron (ZOFRAN) injection 4 mg (4 mg Intravenous Patient Refused/Not Given 04/14/21 1913)  sodium chloride 0.9 % bolus 500 mL (500 mLs Intravenous New Bag/Given 04/14/21 1912)  meclizine (ANTIVERT) tablet 25 mg (25 mg Oral Given 04/14/21 1902)    ED Course  I have reviewed the triage vital signs and the nursing notes.  Pertinent labs & imaging results that were available during my care of the patient were reviewed by me and considered in my medical decision making (see chart for details).    MDM Rules/Calculators/A&P                           Patient with negative MRI.  Patient with vertigo symptoms.  He is sent home with Antivert and will follow up with PCP Final Clinical Impression(s) / ED Diagnoses Final diagnoses:  Dizziness    Rx / DC Orders ED Discharge Orders          Ordered    meclizine (ANTIVERT) 25 MG tablet  3 times daily PRN        04/14/21 2044             Milton Ferguson, MD 04/18/21 [phone]

## 2021-04-20 ENCOUNTER — Emergency Department (EMERGENCY_DEPARTMENT_HOSPITAL): Payer: No Typology Code available for payment source

## 2021-04-20 ENCOUNTER — Emergency Department
Admission: EM | Admit: 2021-04-20 | Discharge: 2021-04-20 | Disposition: A | Discharge status: Home / Self Care | Payer: No Typology Code available for payment source | Attending: Emergency Medicine | Admitting: Emergency Medicine

## 2021-04-20 ENCOUNTER — Other Ambulatory Visit: Payer: Self-pay

## 2021-04-20 DIAGNOSIS — S4982XA Other specified injuries of left shoulder and upper arm, initial encounter: Secondary | ICD-10-CM

## 2021-04-20 DIAGNOSIS — W010XXA Fall on same level from slipping, tripping and stumbling without subsequent striking against object, initial encounter: Secondary | ICD-10-CM | POA: Insufficient documentation

## 2021-04-20 DIAGNOSIS — M25512 Pain in left shoulder: Secondary | ICD-10-CM | POA: Insufficient documentation

## 2021-04-20 DIAGNOSIS — S4990XA Unspecified injury of shoulder and upper arm, unspecified arm, initial encounter: Secondary | ICD-10-CM

## 2021-04-20 NOTE — Discharge Instructions (Signed)
Follow-up with your primary care physician this week for a recheck.  Physical therapy may help.  Return to the emergency department if symptoms worsen, change, new symptoms develop, or for any other concerns.

## 2021-04-20 NOTE — ED Triage Notes (Signed)
Patient states he slipped and fell and landed on his left shoulder about a week ago. Patient states the pain is still there 8/10, patient states he is unable to lift his left arm above his head.

## 2021-04-20 NOTE — ED Provider Notes (Signed)
CHIEF COMPLAINT   Chief Complaint   Patient presents with    Arm Injury            HISTORY OF PRESENT ILLNESS   HPI   60 year old male with history of diabetes, hypertension, hyperlipidemia, presenting to the emergency department with left shoulder pain.  Patient states that he slipped and fell 1 week ago, landing on his left shoulder, lateral aspect.  Since then, he has had decreased range of motion secondary to pain.  Denies neck pain.  Denies headache.  Denies any other injuries.  No distal weakness or numbness.  No chest pain.      History provided by:  Patient  Language interpreter used: No              PAST MEDICAL AND SURGICAL HISTORY   Past Medical History:   Diagnosis Date    Cancer ([place])     Diabetes mellitus ([place])     Hypertension     Kidney disease     Lipidemia     Lung disease     Type II or unspecified type diabetes mellitus without mention of complication, not stated as uncontrolled             SOCIAL HISTORY   Social History     Tobacco Use    Smoking status: Former     Types: Cigars     Quit date: 05/29/2015     Years since quitting: 5.8    Smokeless tobacco: Never    Tobacco comments:     1-2 cigars/day for few years   Substance Use Topics    Alcohol use: Yes     Comment: rarely    Drug use: No        PAST FAMILY HISTORY   Family History     Problem (# of Occurrences) Relation (Name,Age of Onset)    Osteoporosis (1) Mother       Negative family history of: Cancer, Diabetes, Kidney Disorder, Hypertension, Stroke, Heart Disease             ALLERGIES   Review of patient's allergies indicates:  Allergies   Allergen Reactions    Peanuts Anaphylaxis          REVIEW OF SYSTEMS   Review of Systems   Constitutional: Negative for fever and chills.   Respiratory: Negative for shortness of breath.    Cardiovascular: Negative for chest pain.   Gastrointestinal: Negative for nausea and vomiting.   Musculoskeletal: Positive for arthralgias. Negative for back pain and neck pain.   Neurological:  Negative for weakness, numbness, seizures and syncope.              PHYSICAL EXAM   ED VITALS:     Vitals (Arrival)      T: 36.2 C (04/20/21 0657)  BP: (!) 143/93 (04/20/21 0657)  HR: 61 (04/20/21 0657)  RR: 17 (04/20/21 0657)  SpO2: 100 % (04/20/21 0657) Room air   Vitals (Most recent in last 24 hrs)   T: 36.2 C (04/20/21 0657)  BP: 130/78 (04/20/21 0932)  HR: 62 (04/20/21 0932)  RR: 18 (04/20/21 0932)  SpO2: 100 % (04/20/21 0709) Room air  T range: Temp  Min: 36.2 C  Max: 36.2 C  Wt 200 lb (90.719 kg)     Ht 6\' 1"  (1.854 m)     Body mass index is 26.39 kg/m.       Physical Exam  Vitals reviewed.   Constitutional:  General: He is not in acute distress.     Appearance: Normal appearance.   HENT:      Head: Normocephalic and atraumatic.      Right Ear: External ear normal.      Left Ear: External ear normal.      Nose: Nose normal.   Eyes:      Extraocular Movements: Extraocular movements intact.      Pupils: Pupils are equal, round, and reactive to light.   Neck:      Musculoskeletal: Normal range of motion and neck supple.   Cardiovascular:      Pulses: Normal pulses.   Pulmonary:      Effort: Pulmonary effort is normal.   Musculoskeletal:         General: Tenderness (Left lateral shoulder.) present. No swelling or deformity.      Cervical back: Normal range of motion. No tenderness.      Comments: Decreased range of motion of the left shoulder secondary to pain.  Distal neurovascular status intact.   Skin:     General: Skin is warm and dry.      Capillary Refill: Capillary refill takes less than 2 seconds.      Findings: No bruising.   Neurological:      General: No focal deficit present.      Mental Status: He is alert.           LABORATORY:   Labs Reviewed - No data to display      IMAGING:     ED Wet Read -   XR C Spine 2-3 View   Final Result      Views extend from the skull base to C7.      No acute fracture or traumatic subluxation.        Prevertebral soft tissue contour is normal.       Moderate  multilevel degenerative changes of the cervical spine worst at C3-C4, C4-C5, and C5-C6 characterized by disc height loss, endplate osteophytes, and facet arthropathy.      XR Shoulder 2+ Vw Left   Final Result   No acute fracture or dislocation. No focal soft tissue abnormality.          Radiology Final Result -   No image results found.                ED COURSE/MEDICAL DECISION MAKING          Patient presenting to the emergency department with left shoulder pain, decreased range of motion after fall, injuring it 1 week ago.  Fell onto left lateral aspect.  Neurovascular status intact.  On exam, limited range of motion secondary to pain.  No C-spine tenderness.    C-spine x-ray no acute fracture or traumatic subluxation.  Left shoulder x-ray no acute fracture or dislocation.  No focal soft tissue abnormality.    Patient informed of results.  Recommend follow-up with primary care physician for further management.  Patient is well-appearing, nontoxic.  At this time, patient is safe for discharge.  Discussed indications to return to the ED.  Patient discharged home in stable condition.    Addendum: Patient apparently upset after given his discharge instructions.  On his way out by the waiting room, patient became agitated, stating that he wanted an MRI.  Patient states that he will go to the [place].  Patient left before I could speak with him again.  ADDITIONAL INFORMATION REVIEWED  - ATTENDING ONLY           Lorelee Market, MD  04/25/21 1414

## 2021-04-20 NOTE — ED Notes (Signed)
Pt given discharge instructions, appeared angry with results, and promptly set discharge instructions on counter and walked to waiting room. MD aware     Vilinda Blanks, RN  04/20/21 [phone]

## 2021-04-20 NOTE — ED Notes (Signed)
Pt refused EKG at this time.     Sean Oliver, [place]  04/20/21 [phone]

## 2021-04-29 ENCOUNTER — Other Ambulatory Visit (INDEPENDENT_AMBULATORY_CARE_PROVIDER_SITE_OTHER): Payer: Self-pay | Admitting: Physician Assistant

## 2021-04-29 DIAGNOSIS — R059 Cough, unspecified: Secondary | ICD-10-CM

## 2021-05-01 MED ORDER — ALBUTEROL SULFATE HFA 108 (90 BASE) MCG/ACT IN AERS
2.0000 | INHALATION_SPRAY | RESPIRATORY_TRACT | 5 refills | Status: DC | PRN
Start: 2021-05-01 — End: 2023-08-04

## 2021-05-02 ENCOUNTER — Other Ambulatory Visit: Payer: Self-pay

## 2021-05-03 ENCOUNTER — Other Ambulatory Visit (INDEPENDENT_AMBULATORY_CARE_PROVIDER_SITE_OTHER): Payer: Self-pay | Admitting: Physician Assistant

## 2021-05-03 ENCOUNTER — Other Ambulatory Visit (INDEPENDENT_AMBULATORY_CARE_PROVIDER_SITE_OTHER): Payer: Self-pay | Admitting: Family Medicine

## 2021-05-03 DIAGNOSIS — E785 Hyperlipidemia, unspecified: Secondary | ICD-10-CM

## 2021-05-03 DIAGNOSIS — E1122 Type 2 diabetes mellitus with diabetic chronic kidney disease: Secondary | ICD-10-CM

## 2021-05-03 DIAGNOSIS — I10 Essential (primary) hypertension: Secondary | ICD-10-CM

## 2021-05-03 DIAGNOSIS — Z794 Long term (current) use of insulin: Secondary | ICD-10-CM

## 2021-05-05 ENCOUNTER — Encounter (INDEPENDENT_AMBULATORY_CARE_PROVIDER_SITE_OTHER): Payer: Self-pay | Admitting: Family Medicine

## 2021-05-05 ENCOUNTER — Other Ambulatory Visit (INDEPENDENT_AMBULATORY_CARE_PROVIDER_SITE_OTHER): Payer: Self-pay | Admitting: Family Medicine

## 2021-05-05 ENCOUNTER — Encounter (HOSPITAL_BASED_OUTPATIENT_CLINIC_OR_DEPARTMENT_OTHER): Payer: Self-pay

## 2021-05-05 ENCOUNTER — Ambulatory Visit (INDEPENDENT_AMBULATORY_CARE_PROVIDER_SITE_OTHER): Payer: No Typology Code available for payment source | Admitting: Family Medicine

## 2021-05-05 VITALS — BP 138/81 | HR 57 | Temp 98.2°F | Wt 212.0 lb

## 2021-05-05 DIAGNOSIS — S4992XD Unspecified injury of left shoulder and upper arm, subsequent encounter: Secondary | ICD-10-CM

## 2021-05-05 MED ORDER — ATORVASTATIN CALCIUM 40 MG OR TABS
ORAL_TABLET | ORAL | 0 refills | Status: DC
Start: 2021-05-05 — End: 2021-08-05

## 2021-05-05 MED ORDER — LOSARTAN POTASSIUM 50 MG OR TABS
ORAL_TABLET | ORAL | 0 refills | Status: DC
Start: 2021-05-05 — End: 2021-08-05

## 2021-05-05 MED ORDER — METFORMIN HCL 1000 MG OR TABS
ORAL_TABLET | ORAL | 0 refills | Status: DC
Start: 2021-05-05 — End: 2021-08-05

## 2021-05-05 MED ORDER — METOPROLOL SUCCINATE ER 25 MG OR TB24
EXTENDED_RELEASE_TABLET | ORAL | 0 refills | Status: DC
Start: 2021-05-05 — End: 2021-08-05

## 2021-05-05 NOTE — Progress Notes (Signed)
Patient Rooming (in-clinic or Telemed): in clinic    Reason for Visit:   Chief Complaint   Patient presents with    Shoulder Pain     L shoulder, x3 weeks, fell on it         Refills? NO  Referral? NO  Letter or Form? NO  Lab Results? NO    HEALTH MAINTENANCE:  Has the patient had this done since their last visit?  Cervical screening/PAP: N/A  Mammo: N/A  Colon Screen: N/A    Have you seen a specialist since your last visit: No    Vaccines Due? Yes, Shingrix, flu, c19    HM Due:   Health Maintenance   Topic Date Due    Skin Cancer Monitoring for High Risk Patients  Never done    Hepatitis B Vaccine (1 of 3 - 3-dose series) Never done    Diabetes Eye Exam  Never done    Colorectal Cancer Screening  Never done    Zoster Vaccine (1 of 2) Never done    Pneumococcal Vaccine: Pediatrics (0-5 years) and At-Risk Patients (6-64 years) (2 - PCV) 03/18/2016    Depression Screening (PHQ-2)  03/04/2018    COVID-19 Vaccine (4 - Booster for Pfizer series) 07/10/2020    DTaP, Tdap, and Td Vaccines (3 - Td or Tdap) 02/07/2021    Influenza Vaccine (1) 03/15/2021    Diabetes A1c  04/26/2021    Diabetes Foot Exam  07/16/2021    Diabetes Kidney Health Evaluation  07/16/2021    Lipid Disorders Screening  04/07/2022    Hepatitis C Screening  Completed    HIV Screening  Completed    Hepatitis A Vaccine  Aged Out       PCP Verified?  Yes, Matthew Saras, MD

## 2021-05-05 NOTE — Progress Notes (Signed)
[facility]       Chief Complaint   Patient presents with    Shoulder Pain     L shoulder, x3 weeks, fell on it        Subjective:     Sean Oliver is a 60 year old male who presents for an appointment on 05/05/2021.    3 weeks ago he slipped while walking and fell on his left shoulder. He went to the ED about 1 week later and had a normal x-ray result. He has had significant pain, cannot sleep on his side due to pain. Has pain all the time. He feels soreness all around the shoulder and going into his upper arm. Pain is both burning and throbbing. He has significantly limited range of motion as well due to pain. He has tried heating pad without much effect, ice without relief. Has taken ibuprofen but that did not help much.  He is left hand dominant and has family visiting this week so the limitation has been quite onerous.        Objective:    Vitals: BP 138/81    Pulse (!) 57    Temp 36.8 C (Temporal)    Wt 96.2 kg (212 lb)    SpO2 98%    BMI 27.97 kg/m   Physical Exam  Vitals and nursing note reviewed.   Constitutional:       General: He is not in acute distress.     Appearance: Normal appearance. He is not ill-appearing.   HENT:      Head: Normocephalic and atraumatic.   Cardiovascular:      Rate and Rhythm: Normal rate.   Pulmonary:      Effort: Pulmonary effort is normal.   Musculoskeletal:      Comments: Inspection of left shoulder is normal. Upon palpation there is significant tenderness at the anterior aspect of the shoulder and generalized soreness noted by the patient all around the shoulder including superior aspect and into the upper arm. Both active and passive ROM limited by pain to approximately 50 degrees of shoulder flexion, 20 degrees shoulder extension, 40 degrees shoulder abduction. +Pain with full can test with minimal amount of force. Empty can test not done due to pain.   Neurological:      General: No focal deficit present.      Mental Status: He is alert and oriented to  person, place, and time.              Assessment and Plan:        1. Injury of left shoulder, subsequent encounter  Given significant amount of pain and limits to range of motion I am concerned about a rotator cuff injury. Will send urgent referral to sports med. Briefly discussed PT referral with the patient but he would like to figure out the diagnosis before doing PT.  - Referral to Sports Medicine Surgical; Future        Orvilla Cornwall, MD  05/05/2021 11:30 AM   Assistant Professor, [facility]

## 2021-05-07 ENCOUNTER — Ambulatory Visit: Payer: No Typology Code available for payment source | Attending: Physician Assistant | Admitting: Physician Assistant

## 2021-05-07 VITALS — BP 147/92 | HR 57 | Wt 210.3 lb

## 2021-05-07 DIAGNOSIS — S46002A Unspecified injury of muscle(s) and tendon(s) of the rotator cuff of left shoulder, initial encounter: Secondary | ICD-10-CM | POA: Insufficient documentation

## 2021-05-07 DIAGNOSIS — S4992XD Unspecified injury of left shoulder and upper arm, subsequent encounter: Secondary | ICD-10-CM | POA: Insufficient documentation

## 2021-05-07 NOTE — Progress Notes (Signed)
[facility]  Shoulder And Elbow [facility]       [facility]; [address] [place] ; [place]  [address]  [phone]; [phone]    [email or web address]    Patient Name: Sean Oliver  Date of Birth: [birth year]  Medical Record #: [id number]    Primary Care Provider:  [place] [address]      Referring Provider:  Nicholes Oliver  [address]   [place] [address]    Patient Care Team:  No providers found          Chief Complaint   Patient presents with    New Patient     Left shoulder pain       SUBJECTIVE HISTORY  05/07/2021: He is a left handed/somewhat ambidextrous 60 year old male here for evaluation of left shoulder pain. The patient reports the following:  - 04/26/21 slipped at home - potentially uneven ground? - fell onto L shoulder; had immediate stinging pain. Tried to give it some time, ice and was using ibuprofen. Had persistent pain and burning, painful range of motion; eventually went to the ER and had a negative xray. They recommended using heat.   - Has had radiating pain about superior shoulder referring distal about posterolateral upper arm. When he tries to elevate the arm has pain posterior; with lateral elevation has lateral pain. No frank sensory changes about UE, just characteristic burning pain. No issues distal to the upper extremity.   - Sleeps on his side- limited by the shoulder/disrupted. Any active range of motion is aggravating.     Treatment so far has included:  - anti-inflammatories. NI    Chart review shows ER visit 04/20/21 noting injury 1 week prior.    REVIEW OF SYSTEMS   I have reviewed the review of systems located on the second page of the [facility] Patient intake form that is scanned into the media tab. All pertinent review of system findings are noted in the HPI.    Other History    Social History      Occupational History    Not on file   Tobacco Use    Smoking status: Former     Types: Cigars     Quit date: 05/29/2015     Years since quitting: 5.9    Smokeless tobacco: Never    Tobacco comments:     1-2 cigars/day for few years   Substance and Sexual Activity    Alcohol use: Yes     Comment: rarely    Drug use: No    Sexual activity: Yes     Partners: Female     Comment: Married 39 yrs      Past Medical History:   Diagnosis Date    Cancer ([place])     Diabetes mellitus ([place])     Hypertension     Kidney disease     Lipidemia     Lung disease     Type II or unspecified type diabetes mellitus without mention of complication, not stated as uncontrolled      Past Surgical History:   Procedure Laterality Date    left eye surgery x 5      PR RPR INGUN HERNIA SLIDING ANY AGE      many years ago.  bilateral.     Review of patient's  allergies indicates:  Allergies   Allergen Reactions    Peanuts Anaphylaxis       Current Outpatient Medications   Medication Sig Dispense Refill    albuterol HFA 108 (90 Base) MCG/ACT inhaler Inhale 2 puffs by mouth every 4 hours as needed (cough or shortness of breath). 8.5 g 5    atorvastatin 40 MG tablet TAKE ONE TABLET BY MOUTH ONE TIME DAILY 90 tablet 0    BD Pen Needle Nano U/F 32G X 4 MM miscellaneous Inject 1 each under the skin 4 times a day. Use one pen needle to inject insulin four times daily 400 each 1    blood glucose test strip 1 strip 3 times a day. Use to check blood sugar. 100 each 11    fluticasone propionate 50 MCG/ACT nasal spray Spray 2 sprays into each nostril daily. 16 g 6    glucometer (OneTouch Verio) kit Use to monitor blood glucose. 1 each 0    insulin GLARGINE (Basaglar KwikPen) 100 UNIT/ML pen-injector INJECT 20 UNITS UNDER THE SKIN EVERY MORNING 15 mL 3    Jardiance 25 MG tablet TAKE ONE TABLET BY MOUTH ONCE DAILY 90 tablet 1    lancet (OneTouch Delica) 71G Use 1 each 3 times a day. 100 each 11    losartan 50 MG tablet TAKE ONE TABLET  BY MOUTH ONE TIME DAILY 90 tablet 0    metFORMIN 1000 MG tablet TAKE 1 TABLET BY MOUTH TWICE DAILY WITH MEALS. 180 tablet 0    metoprolol succinate ER 25 MG 24 hr tablet TAKE TWO TABLETS BY MOUTH DAILY (DO NOT CRUSH OR CHEW TABLETS) 180 tablet 0    NIFEdipine ER 30 MG 24 hr tablet TAKE TWO TABLETS BY MOUTH TWICE DAILY  360 tablet 3    sildenafil 100 MG tablet Take 1 tablet (100 mg) by mouth daily as needed for erectile dysfunction 30 tablet 2     No current facility-administered medications for this visit.     Family History     Problem (# of Occurrences) Relation (Name,Age of Onset)    Osteoporosis (1) Mother       Negative family history of: Cancer, Diabetes, Kidney Disorder, Hypertension, Stroke, Heart Disease        PHYSICAL EXAMINATION    BP (!) 147/92    Pulse (!) 57    Wt 95.4 kg (210 lb 4.8 oz)    SpO2 98%    BMI 27.75 kg/m     General appearance:  Mr. Sean Oliver is a well developed, well nourished male in no apparent distress.     Psychological:  His judgment, insight, memory, mood and affect appear to be within normal limits    Neurological:  He is alert and oriented x 3     Respiratory:  He is without any obvious respiratory distress    ENT:  He is able to hear and understand verbal questions and commands    SHOULDER SPECIFIC EXAM    Inspection/Palpation Right Left   Observations no No scapular winging; mild protraction at rest   Tenderness n/a Overlying anterior shoulder, SS insertion, minimal along posterior superior joint line    Crepitus no no        Range of Motion of Shoulder: Right Left   Forward elevation (active) 165 30 pain   Forward elevation (passive) n/a Limited by involuntary guarding/pain   External rotation - side 65 20 pain   Internal rotation side T7 Glut pain  Strength of Shoulder: Right Left   Thumb down abduction 5/5 3/5 pain   External rotation at side 5/5 5/5   Stomach compression / belly press 5/5 4/5 pain     Neurovascular exam:  Motor: + axillary, musculocutaneous,  posterior interosseous, anterior interosseous, and ulnar   Vascular: hand is warm and well-perfused.     IMAGING STUDIES  X-rays: Plain films dated 04/20/21 L shoulder were reviewed and demonstrate no glenohumeral arthritis, concentric glenohumeral joint, no acute osseous lesions.    Assessment  and Plan   Mr. Mcgillicuddy is a 60 year old male with left shoulder pain; fall a few weeks ago. Guards and has limited active mobility; pain and give that may correlate with rotator cuff tearing vs contusion. Based on limitations and with restricted mobility recommended obtaining a MRI. If signs of acute tear, that may warrant conversation about surgery. Will make definitive recommendations pending advanced imaging results.       Follow-up: I will plan to see Mr. Wherley back after MRI        Rob Bunting, PA-C  [facility]

## 2021-05-10 ENCOUNTER — Other Ambulatory Visit (INDEPENDENT_AMBULATORY_CARE_PROVIDER_SITE_OTHER): Payer: Self-pay | Admitting: Family Medicine

## 2021-05-10 DIAGNOSIS — I10 Essential (primary) hypertension: Secondary | ICD-10-CM

## 2021-05-12 MED ORDER — NIFEDIPINE ER 30 MG OR TB24
EXTENDED_RELEASE_TABLET | ORAL | 3 refills | Status: DC
Start: 2021-05-12 — End: 2022-03-12

## 2021-05-14 ENCOUNTER — Ambulatory Visit
Admission: RE | Admit: 2021-05-14 | Discharge: 2021-05-14 | Disposition: A | Discharge status: Home / Self Care | Payer: No Typology Code available for payment source | Attending: Diagnostic Radiology | Admitting: Diagnostic Radiology

## 2021-05-14 DIAGNOSIS — S46002A Unspecified injury of muscle(s) and tendon(s) of the rotator cuff of left shoulder, initial encounter: Secondary | ICD-10-CM | POA: Insufficient documentation

## 2021-05-15 ENCOUNTER — Encounter (HOSPITAL_BASED_OUTPATIENT_CLINIC_OR_DEPARTMENT_OTHER): Payer: Self-pay | Admitting: Physician Assistant

## 2021-05-15 DIAGNOSIS — M75112 Incomplete rotator cuff tear or rupture of left shoulder, not specified as traumatic: Secondary | ICD-10-CM

## 2021-05-16 ENCOUNTER — Encounter (HOSPITAL_BASED_OUTPATIENT_CLINIC_OR_DEPARTMENT_OTHER): Payer: Self-pay

## 2021-05-16 NOTE — Telephone Encounter (Addendum)
Message routed to [place].    RN communicated with pt re: PT and injection referrals.

## 2021-06-15 ENCOUNTER — Other Ambulatory Visit (INDEPENDENT_AMBULATORY_CARE_PROVIDER_SITE_OTHER): Payer: Self-pay | Admitting: Family Medicine

## 2021-06-15 DIAGNOSIS — N529 Male erectile dysfunction, unspecified: Secondary | ICD-10-CM

## 2021-06-18 MED ORDER — SILDENAFIL CITRATE 100 MG OR TABS
ORAL_TABLET | ORAL | 0 refills | Status: DC
Start: 2021-06-18 — End: 2021-12-03

## 2021-06-18 NOTE — Telephone Encounter (Signed)
Patient is due for yearly exam.  One refill authorized.  Please schedule follow up visit.

## 2021-06-20 ENCOUNTER — Ambulatory Visit
Payer: No Typology Code available for payment source | Attending: Physical Medicine & Rehabilitation | Admitting: Physical Medicine & Rehabilitation

## 2021-06-20 VITALS — BP 123/76 | HR 61 | Temp 98.1°F | Wt 210.0 lb

## 2021-06-20 DIAGNOSIS — G8929 Other chronic pain: Secondary | ICD-10-CM | POA: Insufficient documentation

## 2021-06-20 DIAGNOSIS — M25512 Pain in left shoulder: Secondary | ICD-10-CM | POA: Insufficient documentation

## 2021-06-20 DIAGNOSIS — M75112 Incomplete rotator cuff tear or rupture of left shoulder, not specified as traumatic: Secondary | ICD-10-CM | POA: Insufficient documentation

## 2021-06-20 MED ORDER — TRIAMCINOLONE ACETONIDE 40 MG/ML IJ SUSP
40.0000 mg | Freq: Once | INTRAMUSCULAR | Status: AC
Start: 2021-06-20 — End: 2021-06-20
  Administered 2021-06-20: 40 mg

## 2021-06-20 MED ORDER — TRIAMCINOLONE ACETONIDE 40 MG/ML IJ SUSP
INTRAMUSCULAR | Status: AC
Start: 2021-06-20 — End: 2021-06-20
  Filled 2021-06-20: qty 1

## 2021-06-20 NOTE — Progress Notes (Signed)
Ultrasound Guided Left Subacromial, [place] Injection     Patient Name:   Sean Oliver    Physician Performing Procedure:    Horton Marshall, MD, [facility]    Referring Provider:  Clydene Laming, PA-C    Date of Procedure:    06/20/2021    Indications for Procedure:    61 year old man with left shoulder pain in the setting of RTC tendinopathy and mild subacromial bursopathy. Referred by orthopedic team for SASD bursa injection.     Injectate:   A total of 5.0 ml; consisting of 1.0 ml of Kenalog (40mg /ml), with the remainder of 1% lidocaine without epinephrine     Side(s) Treated:    Left    Approach:    Lateral to medial, in-plane     Equipment:   14-2 MHz linear array transducer [facility] [phone])    Informed Consent:    All patient questions were answered including the risks, benefits, alternative treatment options, and prognosis. The risks include but are not limited to infection, allergic reaction, and bleeding.  The risk of hyperglycemia, including diabetic ketoacidosis, is increased in patients with diabetes.     Final Verification:   A timeout was performed prior to the procedure including patient name and date of birth and confirming the correct side and indication for procedure.     Procedure:   The patient was prepped and draped in a sterile fashion in the supine position with the arm at the side.  Chlorhexidine was used to prep the sterile field. Using a lateral approach with the needle directed from a lateral to medial in-plane direction on the side mentioned above, the supraspinatus tendon was identified in long axis.  Overlying the supraspinatus and deep to the deltoid muscle the subacromial, subdeltoid bursa was localized under high frequency ultrasound using a linear probe. The skin and needle trajectory overlying this area was anesthetized with about 2 ml 1% lidocaine without epinephrine.  A #25 gauge needle was inserted down to the subacromial, subdeltoid bursa.  Appropriate images were  obtained for documentation purposes.  All images are archived on the [employer]. After negative aspiration, the injectate was administered on the side noted above.  The patient tolerated the procedure well and was discharged after an appropriate period of observation. If there are any complications, the patient was instructed to call us.      Horton Marshall, MD, [facility]

## 2021-06-23 ENCOUNTER — Encounter (HOSPITAL_BASED_OUTPATIENT_CLINIC_OR_DEPARTMENT_OTHER): Payer: No Typology Code available for payment source | Admitting: Physical Medicine & Rehabilitation

## 2021-06-28 ENCOUNTER — Other Ambulatory Visit (INDEPENDENT_AMBULATORY_CARE_PROVIDER_SITE_OTHER): Payer: Self-pay | Admitting: Family Medicine

## 2021-06-28 DIAGNOSIS — Z794 Long term (current) use of insulin: Secondary | ICD-10-CM

## 2021-06-28 DIAGNOSIS — E1122 Type 2 diabetes mellitus with diabetic chronic kidney disease: Secondary | ICD-10-CM

## 2021-07-01 MED ORDER — JARDIANCE 25 MG OR TABS
ORAL_TABLET | ORAL | 0 refills | Status: DC
Start: 2021-07-01 — End: 2021-09-30

## 2021-08-02 ENCOUNTER — Other Ambulatory Visit (INDEPENDENT_AMBULATORY_CARE_PROVIDER_SITE_OTHER): Payer: Self-pay | Admitting: Family Medicine

## 2021-08-02 ENCOUNTER — Other Ambulatory Visit (INDEPENDENT_AMBULATORY_CARE_PROVIDER_SITE_OTHER): Payer: Self-pay | Admitting: Physician Assistant

## 2021-08-02 DIAGNOSIS — N1832 Chronic kidney disease, stage 3b: Secondary | ICD-10-CM

## 2021-08-02 DIAGNOSIS — E785 Hyperlipidemia, unspecified: Secondary | ICD-10-CM

## 2021-08-02 DIAGNOSIS — I10 Essential (primary) hypertension: Secondary | ICD-10-CM

## 2021-08-02 DIAGNOSIS — E1122 Type 2 diabetes mellitus with diabetic chronic kidney disease: Secondary | ICD-10-CM

## 2021-08-05 MED ORDER — METOPROLOL SUCCINATE ER 25 MG OR TB24
EXTENDED_RELEASE_TABLET | ORAL | 0 refills | Status: DC
Start: 2021-08-05 — End: 2021-11-03

## 2021-08-05 MED ORDER — ATORVASTATIN CALCIUM 40 MG OR TABS
ORAL_TABLET | ORAL | 0 refills | Status: DC
Start: 2021-08-05 — End: 2021-11-03

## 2021-08-05 MED ORDER — METFORMIN HCL 1000 MG OR TABS
ORAL_TABLET | ORAL | 0 refills | Status: DC
Start: 2021-08-05 — End: 2021-11-03

## 2021-08-05 MED ORDER — LOSARTAN POTASSIUM 50 MG OR TABS
ORAL_TABLET | ORAL | 0 refills | Status: DC
Start: 2021-08-05 — End: 2021-11-03

## 2021-09-27 ENCOUNTER — Other Ambulatory Visit (INDEPENDENT_AMBULATORY_CARE_PROVIDER_SITE_OTHER): Payer: Self-pay | Admitting: Family Medicine

## 2021-09-27 DIAGNOSIS — E1122 Type 2 diabetes mellitus with diabetic chronic kidney disease: Secondary | ICD-10-CM

## 2021-09-30 MED ORDER — JARDIANCE 25 MG OR TABS
ORAL_TABLET | ORAL | 0 refills | Status: DC
Start: 2021-09-30 — End: 2022-01-06

## 2021-09-30 NOTE — Telephone Encounter (Signed)
Patient is due for follow up appt. Rx authorized one time. Please schedule appt.

## 2021-10-02 NOTE — Telephone Encounter (Signed)
LMTCB 1st attempt

## 2021-10-03 NOTE — Telephone Encounter (Signed)
LMTCB 2nd attempt

## 2021-10-13 ENCOUNTER — Encounter (INDEPENDENT_AMBULATORY_CARE_PROVIDER_SITE_OTHER): Payer: Self-pay

## 2021-10-27 ENCOUNTER — Other Ambulatory Visit (INDEPENDENT_AMBULATORY_CARE_PROVIDER_SITE_OTHER): Payer: Self-pay | Admitting: Family Medicine

## 2021-10-27 DIAGNOSIS — J302 Other seasonal allergic rhinitis: Secondary | ICD-10-CM

## 2021-10-28 MED ORDER — FLUTICASONE PROPIONATE 50 MCG/ACT NA SUSP: NASAL | 2 refills | Status: DC

## 2021-11-01 ENCOUNTER — Other Ambulatory Visit (INDEPENDENT_AMBULATORY_CARE_PROVIDER_SITE_OTHER): Payer: Self-pay | Admitting: Physician Assistant

## 2021-11-01 ENCOUNTER — Other Ambulatory Visit (INDEPENDENT_AMBULATORY_CARE_PROVIDER_SITE_OTHER): Payer: Self-pay | Admitting: Family Medicine

## 2021-11-01 DIAGNOSIS — E785 Hyperlipidemia, unspecified: Secondary | ICD-10-CM

## 2021-11-01 DIAGNOSIS — E1122 Type 2 diabetes mellitus with diabetic chronic kidney disease: Secondary | ICD-10-CM

## 2021-11-01 DIAGNOSIS — I10 Essential (primary) hypertension: Secondary | ICD-10-CM

## 2021-11-03 MED ORDER — LOSARTAN POTASSIUM 50 MG OR TABS
50.0000 mg | ORAL_TABLET | Freq: Every day | ORAL | 0 refills | Status: DC
Start: 2021-11-03 — End: 2022-02-10

## 2021-11-03 MED ORDER — METOPROLOL SUCCINATE ER 25 MG OR TB24
EXTENDED_RELEASE_TABLET | ORAL | 0 refills | Status: DC
Start: 2021-11-03 — End: 2022-02-09

## 2021-11-03 MED ORDER — METFORMIN HCL 1000 MG OR TABS
1000.0000 mg | ORAL_TABLET | Freq: Two times a day (BID) | ORAL | 0 refills | Status: DC
Start: 2021-11-03 — End: 2022-02-03

## 2021-11-03 MED ORDER — ATORVASTATIN CALCIUM 40 MG OR TABS
40.0000 mg | ORAL_TABLET | Freq: Every day | ORAL | 0 refills | Status: DC
Start: 2021-11-03 — End: 2022-02-10

## 2021-11-03 NOTE — Telephone Encounter (Signed)
To Front desk:    Patient is due for yearly wellness exam.  One refill authorized.  Please schedule follow up visit.      To provider:    Refill request received for atorvastatin and losartan has been approved. However last Lipid panel was 2018 and  BMP was 07/16/2020.  Forward to PCP to review.

## 2021-11-11 NOTE — Telephone Encounter (Signed)
LMTCb 1st attempt

## 2021-11-12 ENCOUNTER — Other Ambulatory Visit (HOSPITAL_BASED_OUTPATIENT_CLINIC_OR_DEPARTMENT_OTHER): Payer: Self-pay

## 2021-11-12 DIAGNOSIS — N1832 Chronic kidney disease, stage 3b: Secondary | ICD-10-CM

## 2021-11-13 NOTE — Telephone Encounter (Signed)
LMTCB 2nd attempt

## 2021-11-17 ENCOUNTER — Encounter (INDEPENDENT_AMBULATORY_CARE_PROVIDER_SITE_OTHER): Payer: Self-pay

## 2021-11-17 ENCOUNTER — Ambulatory Visit: Payer: No Typology Code available for payment source | Attending: Nephrology | Admitting: Nephrology

## 2021-11-17 ENCOUNTER — Encounter (HOSPITAL_BASED_OUTPATIENT_CLINIC_OR_DEPARTMENT_OTHER): Payer: Self-pay | Admitting: Nephrology

## 2021-11-17 VITALS — BP 146/90 | HR 68 | Temp 98.1°F | Wt 210.8 lb

## 2021-11-17 DIAGNOSIS — N1832 Chronic kidney disease, stage 3b: Secondary | ICD-10-CM | POA: Insufficient documentation

## 2021-11-17 DIAGNOSIS — E1122 Type 2 diabetes mellitus with diabetic chronic kidney disease: Secondary | ICD-10-CM | POA: Insufficient documentation

## 2021-11-17 DIAGNOSIS — I10 Essential (primary) hypertension: Secondary | ICD-10-CM | POA: Insufficient documentation

## 2021-11-17 DIAGNOSIS — I129 Hypertensive chronic kidney disease with stage 1 through stage 4 chronic kidney disease, or unspecified chronic kidney disease: Secondary | ICD-10-CM | POA: Insufficient documentation

## 2021-11-17 DIAGNOSIS — N1831 Chronic kidney disease, stage 3a: Secondary | ICD-10-CM | POA: Insufficient documentation

## 2021-11-17 DIAGNOSIS — Z794 Long term (current) use of insulin: Secondary | ICD-10-CM | POA: Insufficient documentation

## 2021-11-17 DIAGNOSIS — N2889 Other specified disorders of kidney and ureter: Secondary | ICD-10-CM | POA: Insufficient documentation

## 2021-11-17 DIAGNOSIS — C649 Malignant neoplasm of unspecified kidney, except renal pelvis: Secondary | ICD-10-CM | POA: Insufficient documentation

## 2021-11-17 NOTE — Progress Notes (Signed)
NEPHROLOGY CLINIC FOLLOW UP NOTE  11/17/2021            REFERRING PHYSICIAN:   Morhaf Al Achkar     INITIAL CONSULT DATE: 05/28/2017      Past Medical History:   Diagnosis Date    Cancer ([place])     Diabetes mellitus ([place])     Hypertension     Kidney disease     Lipidemia     Lung disease     Type II or unspecified type diabetes mellitus without mention of complication, not stated as uncontrolled      Past Surgical History:   Procedure Laterality Date    left eye surgery x 5      PR RPR INGUN HERNIA SLIDING ANY AGE      many years ago.  bilateral.        HISTORY OF PRESENT ILLNESS: Sean Oliver is a 61 year old male with past medical history significant for the above mentioned problems.    CC: Follow up on CKD and Hypertension     Based on verbal report, past medical history is significant for:    Diabetes mellitus of at least 15 years duration, though has been very well controlled over this period of time.  He was initially started off on insulin but subsequently has been switched to metformin only with good glycemic control.  By his report, he does not know of any end organ damage including absence of any diabetic retinopathy or neuropathy.  Also has long-standing hypertension of at least 20 years duration.  While he has not checked his pressures regularly at home, he reports that for the most part at doctors visits he was told that the blood pressure is only marginally elevated.  Prolonged time he was on combination lisinopril with hydrochlorothiazide, that was changed subsequently to alternative agents.  One reasoning for stopping lisinopril was to determine if it would lead to an improvement in creatinine/GFR.  However, over the last few months patient has been experiencing uncontrolled hypertension with most readings in the 160s-170s/90s-100 range.  He is not specifically expressing any symptoms related to these uncontrolled readings and denies any history of TIAs/CVAs.  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  Also had considerable issues with diverticulitis leading to laparotomy with limited resection as well in the past.    During the recent follow-ups with primary care physician and in effort to undertake a diagnostic evaluation for his chronic kidney disease, both urinary and imaging studies were undertaken.  Urinalyses now revealing increased albuminuria while imaging studies including ultrasound, CT scan and MRI also indicating around 2 cm left-sided interpolar mass most compatible with renal cell carcinoma.  He has not experienced any specific flank pains, dysuria, renal colic or hematuria.  Similarly no major change in urinary habit and no problems with dependent edema.    No history of nephrolithiasis, recurrent urinary tract infections or pyelonephritis.  Only occasional use of NSAIDs with no over-the-counter/herbal supplement use.      Hospitalization between 2/1-07/19/2019 for hyperglycemic hyperosmolar syndrome (HHS) as well as accelerated hypertension.  Since that hospitalization is management has been modified with initiation of empagliflozin and insulin.  He was followed by diabetes clinic after that.  For hypertension, lisinopril was previously switched to losartan on the complaints of a cough.       Interval History:  Has not been checking his blood pressures at home very frequently;  overall clinic readings showing a bit of fluctuation. Reports that he has been very compliant with his medications that his wife sets up for him. He also reports that there has been no change in his medications or dosages in the interim.  Denying any headaches, visual changes or postural problems.  No history of dependent edema.    Since initiation of empagliflozin, he does not describe any significant change in his urinary output.  Has not experienced any issues with urinary tract infections or other genital problems.     Previously  encountered nocturia has dissipated now that he is using CPAP regularly.      Most significantly in consultation with Dr. 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 He has opted for close surveillance with MRI scans.     He has not experienced any dyspnea, chest pain, visual problems or dependent edema.          ALLERGIES:    Review of patient's allergies indicates:  Allergies   Allergen Reactions    Peanuts Anaphylaxis       HOME MEDICATIONS:      Current Outpatient Medications:     albuterol HFA 108 (90 Base) MCG/ACT inhaler, Inhale 2 puffs by mouth every 4 hours as needed (cough or shortness of breath)., Disp: 8.5 g, Rfl: 5    atorvastatin 40 MG tablet, Take 1 tablet (40 mg) by mouth daily. Due for appointment and lab., Disp: 90 tablet, Rfl: 0    BD Pen Needle Nano U/F 32G X 4 MM miscellaneous, Inject 1 each under the skin 4 times a day. Use one pen needle to inject insulin four times daily, Disp: 400 each, Rfl: 1    blood glucose test strip, 1 strip 3 times a day. Use to check blood sugar., Disp: 100 each, Rfl: 11    fluticasone propionate 50 MCG/ACT nasal spray, Use two sprays in each nostril once daily, Disp: 16 g, Rfl: 2    glucometer (OneTouch Verio) kit, Use to monitor blood glucose., Disp: 1 each, Rfl: 0    insulin GLARGINE (Basaglar KwikPen) 100 UNIT/ML pen-injector, INJECT 20 UNITS UNDER THE SKIN EVERY MORNING, Disp: 15 mL, Rfl: 3    Jardiance 25 MG tablet, TAKE ONE TABLET BY MOUTH ONE TIME DAILY, Disp: 90 tablet, Rfl: 0    lancet (OneTouch Delica) 29H, Use 1 each 3 times a day., Disp: 100 each, Rfl: 11    losartan 50 MG tablet, Take 1 tablet (50 mg) by mouth daily. Due for appointment and lab., Disp: 90 tablet, Rfl: 0    metFORMIN 1000 MG tablet, Take 1 tablet (1,000 mg) by mouth 2 times a day with meals. Due for appointment and lab., Disp: 180 tablet, Rfl: 0    metoprolol succinate ER 25 MG 24 hr tablet, TAKE TWO TABLETS BY MOUTH DAILY  (DO NOT CRUSH OR CHEW TABLETS), Disp: 180 tablet, Rfl: 0    NIFEdipine ER 30 MG 24 hr tablet, TAKE TWO TABLETS BY MOUTH TWICE DAILY, Disp: 360 tablet, Rfl: 3    sildenafil 100 MG tablet, TAKE ONE TABLET BY MOUTH ONCE DAILY AS NEEDED FOR ERECTILE DYSFUNTION, Disp: 30 tablet, Rfl: 0        PHYSICAL EXAM:     Vitals Signs: BP (!) 146/90   Pulse 68   Temp 36.7 C (Temporal)   Wt 95.6 kg (210 lb 12.8 oz)   SpO2 100%   BMI 27.81 kg/m .  Constitutional: Sitting comfortably in chair, in no apparent distress.  Eyes:  Pupils normal size, EOM intact  Mouth/Throat: Mucous membranes are moist,  no oropharyngeal lesions  Neck: No JVD or carotid bruits  Lungs: No rhonchi or creptitus  Heart: S1, S2 with II/VI ESM, no rubs/ gallops  Abdomen: Soft, nontender; bowel sounds normal  Extremities: No pretibial or pedal edema,    Neuro: Alert O X3, no focal deficiits  Skin: No rash        DIAGNOSTIC STUDIES:                    MRI 09/14/2020:    Compared to MRI abdomen 03/20/2019:     1. Interval growth of hyperenhancing left interpolar mass now measuring 2.3 cm, previously 1.9 cm. 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 No evidence of local or vascular invasion.     2. Similar indeterminant 1 cm segment 6 hepatic lesion; likely benign given long-term stability.      Renal Mass Biopsy 11/05/2020:  Final Diagnosis   A) Left renal mass, biopsy:                - Chromophobe renal cell carcinoma.          ASSESSMENT AND PLAN:    -Chronic Kidney Disease Stage 3A  Review of available labs since at least 2015 show a creatinine ranging from 1.27-1.61 (and one value obtained through [facility] from 04/28/1996 of 1.66m/dL).  This indicates long-standing element of chronic kidney disease with eGFR likely in the ~ 55 mL/minute range albeit a more or less stable course over this period of time with minimal progression.  Last serum creatinine at 1.49 with eGFR  528mminute (07/16/2020), that indicates  overall stable status without noticeable progression. Has not had repeat labs in the interim and unfortunately did not get any after the last renal clinic visit. Reports that he has a PCP visit coming up in 2-3 days [facility]     Albuminuria continues with variable severity 38732m- 732m53m  Continues on losartan with empagliflozin.  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  Ultrasound of the kidneys is instructive in this regard indicating increased echogenicity from interstitial fibrosis.  Previously, a monoclonal disorder was ruled out with urine and serum electrophoresis studies as well as free light chain assays.      Needs continued aggressive risk factor modification (see below).    -Hypertension, Primary  Unfortunately not checking home readings frequently.  Given overall stable clinic readings, continue on current regimen of nifedipine XL 60 mg twice daily, metoprolol extended release 50 mg daily and losartan 50 mg daily.  I have instructed him to start checking his blood pressures regularly at home for additional antihypertensive modification.       -Diabetes mellitus type 2, uncontrolled  After course of relatively stable control of diabetes mellitus, was hospitalized in early February 2021 with hyperglycemic hyperosmolar syndrome requiring initiation of insulin.  Since that time he has also been started on empagliflozin.  Last hemoglobin A1c at 13% and no follow up check available to me for review.     Continue losartan for maximum anti-proteinuric effect.         -Renal cell carcinoma   Imaging studies showed a 2 cm lesion in the interpolar region of the left  kidney. On the basis of recent MRI 09/14/2020 there appeared to be slight progression to now 2.3 cm lesion.    In consultation with Dr. 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 He has opted for close surveillance with MRI scans. Follow up visit in July 2023.         Return to clinic in 6 months with CBC, renal function panel, urinalysis with albumin to creatinine ratio, iPTH.      Sean Hausen, MD, 11/17/2021  Division of Nephrology  [facility]  [place], [place]

## 2021-11-26 ENCOUNTER — Encounter (INDEPENDENT_AMBULATORY_CARE_PROVIDER_SITE_OTHER): Payer: Self-pay

## 2021-12-01 ENCOUNTER — Other Ambulatory Visit (INDEPENDENT_AMBULATORY_CARE_PROVIDER_SITE_OTHER): Payer: Self-pay | Admitting: Family Medicine

## 2021-12-01 DIAGNOSIS — N529 Male erectile dysfunction, unspecified: Secondary | ICD-10-CM

## 2021-12-03 MED ORDER — SILDENAFIL CITRATE 100 MG OR TABS
ORAL_TABLET | ORAL | 0 refills | Status: DC
Start: 2021-12-03 — End: 2022-04-08

## 2021-12-03 NOTE — Telephone Encounter (Signed)
Last visit with PCP was 03/21/20.   Please schedule wellness visit with Dr. Lavena Bullion.

## 2021-12-05 NOTE — Telephone Encounter (Signed)
1st Attempt-LVMTCB

## 2021-12-08 NOTE — Telephone Encounter (Signed)
LVMTCB to schedule with any available provider (Dr. Lavena Bullion has left the clinic and will not have a schedule) for next refill.    Second attempt    Closing TE

## 2021-12-09 ENCOUNTER — Other Ambulatory Visit (INDEPENDENT_AMBULATORY_CARE_PROVIDER_SITE_OTHER): Payer: Self-pay | Admitting: Family Medicine

## 2021-12-09 DIAGNOSIS — R911 Solitary pulmonary nodule: Secondary | ICD-10-CM

## 2021-12-14 ENCOUNTER — Ambulatory Visit
Admission: RE | Admit: 2021-12-14 | Discharge: 2021-12-14 | Disposition: A | Discharge status: Home / Self Care | Payer: No Typology Code available for payment source | Attending: Diagnostic Radiology | Admitting: Diagnostic Radiology

## 2021-12-14 DIAGNOSIS — R911 Solitary pulmonary nodule: Secondary | ICD-10-CM | POA: Insufficient documentation

## 2021-12-15 ENCOUNTER — Other Ambulatory Visit (HOSPITAL_BASED_OUTPATIENT_CLINIC_OR_DEPARTMENT_OTHER): Payer: Self-pay | Admitting: Urology

## 2021-12-15 DIAGNOSIS — N2889 Other specified disorders of kidney and ureter: Secondary | ICD-10-CM

## 2021-12-17 ENCOUNTER — Telehealth (INDEPENDENT_AMBULATORY_CARE_PROVIDER_SITE_OTHER): Payer: Self-pay | Admitting: Urology

## 2021-12-17 NOTE — Telephone Encounter (Signed)
Called and LVM with patient to call clinic back to reschedule an appt with Dr. Jodi Mourning. Cancelled appt  of 7/10,  r/s return appt has to be after MRI.  Currently, MRI is 01/10/2022.

## 2021-12-22 ENCOUNTER — Encounter (INDEPENDENT_AMBULATORY_CARE_PROVIDER_SITE_OTHER): Payer: No Typology Code available for payment source | Admitting: Urology

## 2022-01-03 ENCOUNTER — Other Ambulatory Visit (INDEPENDENT_AMBULATORY_CARE_PROVIDER_SITE_OTHER): Payer: Self-pay | Admitting: Family Medicine

## 2022-01-03 DIAGNOSIS — N1832 Chronic kidney disease, stage 3b: Secondary | ICD-10-CM

## 2022-01-06 MED ORDER — JARDIANCE 25 MG OR TABS
ORAL_TABLET | ORAL | 0 refills | Status: DC
Start: 2022-01-06 — End: 2022-02-03

## 2022-01-06 NOTE — Telephone Encounter (Signed)
Patient is due for yearly exam.  One refill authorized.  Please schedule follow up visit.

## 2022-01-10 ENCOUNTER — Ambulatory Visit
Admission: RE | Admit: 2022-01-10 | Discharge: 2022-01-10 | Disposition: A | Discharge status: Home / Self Care | Payer: No Typology Code available for payment source | Attending: Diagnostic Radiology | Admitting: Diagnostic Radiology

## 2022-01-10 DIAGNOSIS — N2889 Other specified disorders of kidney and ureter: Secondary | ICD-10-CM | POA: Insufficient documentation

## 2022-01-10 MED ORDER — GADOTERIDOL 279.3 MG/ML IV SOLN
20.0000 mL | Freq: Once | INTRAVENOUS | Status: AC | PRN
Start: 2022-01-10 — End: 2022-01-10
  Administered 2022-01-10: 10 mmol via INTRAVENOUS

## 2022-01-12 ENCOUNTER — Ambulatory Visit: Payer: No Typology Code available for payment source | Admitting: Urology

## 2022-01-12 VITALS — BP 158/100 | HR 64 | Wt 212.0 lb

## 2022-01-12 DIAGNOSIS — C642 Malignant neoplasm of left kidney, except renal pelvis: Secondary | ICD-10-CM

## 2022-01-12 NOTE — Progress Notes (Signed)
Added to recall list

## 2022-01-12 NOTE — Progress Notes (Signed)
[facility] Follow Up Clinic Note    CC: Kidney cancer    HPI:  Mr. Sean Oliver is a 61 year old man with history of small renal mass confirmed by biopsy to be chromophobe renal cell carcinoma.    He has multiple medical comorbidities including CKD, type 2 diabetes, hypertension, and diverticulitis status post colectomy x2, with a history of incisional hernia and closure with mesh.  He is on CPAP for OSA.  We have followed him for an incidentally diagnosed solid enhancing left renal mass that was initially 2.2 cm on CT in 2018, though remeasured a few days later is 1.8 cm on MRI.  This was stable in size in 2019, and 2020.      Prior conversation patient was interested in percutaneous cryoablation versus possible partial nephrectomy.  He underwent the renal mass biopsy first.  This came back chromophobe renal cell carcinoma.  Decided for further surveillance in the meantime.    Interval history:  Patient reports no changes to his medical history.  He quickly asked if everything was okay and wanted to not discuss further.       Past Medical History:   Diagnosis Date    Cancer ([place])     Diabetes mellitus ([place])     Hypertension     Kidney disease     Lipidemia     Lung disease     Type II or unspecified type diabetes mellitus without mention of complication, not stated as uncontrolled        Past Surgical History:   Procedure Laterality Date    left eye surgery x 5      PR RPR INGUN HERNIA SLIDING ANY AGE      many years ago.  bilateral.       Current Outpatient Medications   Medication Sig Dispense Refill    albuterol HFA 108 (90 Base) MCG/ACT inhaler Inhale 2 puffs by mouth every 4 hours as needed (cough or shortness of breath). 8.5 g 5    atorvastatin 40 MG tablet Take 1 tablet (40 mg) by mouth daily. Due for appointment and lab. 90 tablet 0    BD Pen Needle Nano U/F 32G X 4 MM miscellaneous Inject 1 each under the skin 4 times a day. Use one pen needle to inject insulin four times  daily 400 each 1    blood glucose test strip 1 strip 3 times a day. Use to check blood sugar. 100 each 11    empagliflozin (Jardiance) 25 MG tablet TAKE ONE TABLET BY MOUTH ONE TIME DAILY 30 tablet 0    fluticasone propionate 50 MCG/ACT nasal spray Use two sprays in each nostril once daily 16 g 2    glucometer (OneTouch Verio) kit Use to monitor blood glucose. 1 each 0    insulin GLARGINE (Basaglar KwikPen) 100 UNIT/ML pen-injector INJECT 20 UNITS UNDER THE SKIN EVERY MORNING 15 mL 3    lancet (OneTouch Delica) 25E Use 1 each 3 times a day. 100 each 11    losartan 50 MG tablet Take 1 tablet (50 mg) by mouth daily. Due for appointment and lab. 90 tablet 0    metFORMIN 1000 MG tablet Take 1 tablet (1,000 mg) by mouth 2 times a day with meals. Due for appointment and lab. 180 tablet 0    metoprolol succinate ER 25 MG 24 hr tablet TAKE TWO TABLETS BY MOUTH DAILY (DO NOT CRUSH OR CHEW TABLETS) 180 tablet 0  NIFEdipine ER 30 MG 24 hr tablet TAKE TWO TABLETS BY MOUTH TWICE DAILY 360 tablet 3    sildenafil 100 MG tablet TAKE ONE TABLET BY MOUTH ONCE DAILY AS NEEDED FOR ERECTILE DYSFUNTION 30 tablet 0     No current facility-administered medications for this visit.       Peanuts    PHYSICAL EXAM:  BP (!) 158/100   Pulse 64   Wt 96.2 kg (212 lb)   BMI 27.97 kg/m       Radiology and Laboratory results reviewed.     Radiology review:  MRI from 01/10/2022 reviewed and I interpreted to have a relatively small and stable renal mass and approximately 2 cm.    Discussion:  I discussed that I reviewed the imaging and patient promptly went did not discuss the findings further.  He simply wanted to know if things are stable.  He wanted to go at this point but I did mention that I would recommend continued imaging next year which he stated he was amenable to.    Impression/plan:  61 year old male with small left renal mass, chromophobe renal cell carcinoma.  Overall stable in size.  Patient would like no active  intervention.    Plan for repeat MRI in 1 year.

## 2022-01-12 NOTE — Progress Notes (Deleted)
[facility] Follow Up Clinic Note    REFERRING PROVIDER: ***    CC: ***    HPI:  Mr. Sean Oliver is a 61 year old {man/woman:108769} with history of ***. ***      He has multiple medical comorbidities including CKD, type 2 diabetes, hypertension, and diverticulitis status post colectomy x2, with a history of incisional hernia and closure with mesh.  He is on CPAP for OSA.  We have followed him for an incidentally diagnosed solid enhancing left renal mass that was initially 2.2 cm on CT in 2018, though remeasured a few days later is 1.8 cm on MRI.  This was stable in size in 2019, and 2020.       11/25/2020:  After last discussion in April 2022 he was interested in percutaneous cryoablation as an option versus retroperitoneal partial nephrectomy.  Ultimately it was decided to perform a renal mass biopsy prior to any decision on intervention.  He had an uneventful renal mass biopsy. Founf to have chromophobe tumor    Interval hx  A *** was performed for work up *** on *** revealed a *** . he denies ***gross hematuria or ***flank pain. Mr. Sean Oliver reports that he has *** had a biopsy of the ***.     Past Medical History:   Diagnosis Date    Cancer ([place])     Diabetes mellitus ([place])     Hypertension     Kidney disease     Lipidemia     Lung disease     Type II or unspecified type diabetes mellitus without mention of complication, not stated as uncontrolled        Past Surgical History:   Procedure Laterality Date    left eye surgery x 5      PR RPR INGUN HERNIA SLIDING ANY AGE      many years ago.  bilateral.       Social History     Socioeconomic History    Marital status: Married     Spouse name: Not on file    Number of children: Not on file    Years of education: Not on file    Highest education level: Not on file   Occupational History    Not on file   Tobacco Use    Smoking status: Former     Types: Cigars     Quit date: 05/29/2015     Years since quitting: 6.6    Smokeless  tobacco: Never    Tobacco comments:     1-2 cigars/day for few years   Substance and Sexual Activity    Alcohol use: Yes     Comment: rarely    Drug use: No    Sexual activity: Yes     Partners: Female     Comment: Married 20 yrs    Other Topics Concern    Not on file   Social History Narrative    Married. Lives in [place]. Previously in [employer]. Retired from [employer].     Social Determinants of Health     Financial Resource Strain: Not on file   Food Insecurity: Not on file   Transportation Needs: Not on file   Physical Activity: Not on file   Stress: Not on file   Social Connections: Not on file   Intimate Partner Violence: Not on file   Housing Stability: Not on file     Family History: *** family history  of renal cancer. ***    Current Outpatient Medications   Medication Sig Dispense Refill    albuterol HFA 108 (90 Base) MCG/ACT inhaler Inhale 2 puffs by mouth every 4 hours as needed (cough or shortness of breath). 8.5 g 5    atorvastatin 40 MG tablet Take 1 tablet (40 mg) by mouth daily. Due for appointment and lab. 90 tablet 0    BD Pen Needle Nano U/F 32G X 4 MM miscellaneous Inject 1 each under the skin 4 times a day. Use one pen needle to inject insulin four times daily 400 each 1    blood glucose test strip 1 strip 3 times a day. Use to check blood sugar. 100 each 11    empagliflozin (Jardiance) 25 MG tablet TAKE ONE TABLET BY MOUTH ONE TIME DAILY 30 tablet 0    fluticasone propionate 50 MCG/ACT nasal spray Use two sprays in each nostril once daily 16 g 2    glucometer (OneTouch Verio) kit Use to monitor blood glucose. 1 each 0    insulin GLARGINE (Basaglar KwikPen) 100 UNIT/ML pen-injector INJECT 20 UNITS UNDER THE SKIN EVERY MORNING 15 mL 3    lancet (OneTouch Delica) 29H Use 1 each 3 times a day. 100 each 11    losartan 50 MG tablet Take 1 tablet (50 mg) by mouth daily. Due for appointment and lab. 90 tablet 0    metFORMIN 1000 MG tablet Take 1 tablet (1,000 mg) by mouth 2 times a day with  meals. Due for appointment and lab. 180 tablet 0    metoprolol succinate ER 25 MG 24 hr tablet TAKE TWO TABLETS BY MOUTH DAILY (DO NOT CRUSH OR CHEW TABLETS) 180 tablet 0    NIFEdipine ER 30 MG 24 hr tablet TAKE TWO TABLETS BY MOUTH TWICE DAILY 360 tablet 3    sildenafil 100 MG tablet TAKE ONE TABLET BY MOUTH ONCE DAILY AS NEEDED FOR ERECTILE DYSFUNTION 30 tablet 0     No current facility-administered medications for this visit.       Peanuts    REVIEW OF SYSTEMS:  ***add pertinent positives   Constitutional: Denies fevers or chills   Eyes: Negative    Ears, Nose, Mouth, Throat: Negative    Cardiovascular: Negative    Respiratory: Negative    Gastrointestinal: Negative   Genitourinary: Negative   Musculoskeletal: Negative    Skin: Negative    Neurological: Negative    Psychiatric: Negative    Endocrine: Negative    Hematologic/Lymphatic: Negative   Allergic/Immunologic: Negative     PHYSICAL EXAM:  BP (!) 158/100   Pulse 64   Wt 96.2 kg (212 lb)   BMI 27.97 kg/m   General: healthy, alert, no distress  Head: Normocephalic, atraumatic  Lungs: Nonlabored respirations, no cough  Back: {BACK EXAM:801::"Back symmetric, no deformity; ROM normal; No CVA tenderness."}  Abd: soft, non-tender, no palpable masses. ***    Radiology and Laboratory results reviewed.   I reviewed and discussed laboratory results with the patient today.     {common lab results:107041}      I reviewed and discussed radiology results with the patient today.   Radiology review:  ***    ASSESSMENT:  Mr. Sean Oliver is a 61 year old {man/woman:108769} with ***.       We had discussion about the likelihood that this ***mass represents a malignancy, which is *** based on the size ***. We reviewed the risks, benefits, and alternatives of options for managing  the renal mass, including renal biopsy, thermal ablation, nephron-sparing partial nephrectomy, radical nephrectomy, and active surveillance. The patient understands that there could  significant interval growth on surveillance imaging, although this is unlikely as the average annual growth rate is expected to about 79m per year.     For partial nephrectomy, this would performed laparoscopically with robotic assistance. The typical hospital stay is for at least two nights. They would expect to have a foley catheter and a surgical drain, which are typically removed prior to discharge. There risks include, bleeding requiring transfusion or interventional radiology embolization, urine leak, injury to surrounding organs ***, positioning injury, general surgical complications that are rare but non-zero (stroke, cardiac complications, pulmonary embolus, and death), and possible need for nephrectomy.     he expressed understanding of this procedure and wants to proceed with it.  Patient signed his informed consent.    PLAN:  - Schedule for ***.  - ***  - ***    I, Dr. HGardner Candle MD, saw this patient in a combined visit with Dr. *Marland KitchenPlease see Dr. *Marland Kitchen note for this encounter for additional details.

## 2022-01-31 ENCOUNTER — Other Ambulatory Visit (INDEPENDENT_AMBULATORY_CARE_PROVIDER_SITE_OTHER): Payer: Self-pay | Admitting: Family Medicine

## 2022-01-31 ENCOUNTER — Other Ambulatory Visit (INDEPENDENT_AMBULATORY_CARE_PROVIDER_SITE_OTHER): Payer: Self-pay | Admitting: Physician Assistant

## 2022-01-31 DIAGNOSIS — Z794 Long term (current) use of insulin: Secondary | ICD-10-CM

## 2022-01-31 DIAGNOSIS — E1122 Type 2 diabetes mellitus with diabetic chronic kidney disease: Secondary | ICD-10-CM

## 2022-02-03 MED ORDER — JARDIANCE 25 MG OR TABS
ORAL_TABLET | ORAL | 1 refills | Status: DC
Start: 2022-02-03 — End: 2022-03-12

## 2022-02-03 MED ORDER — METFORMIN HCL 1000 MG OR TABS
ORAL_TABLET | ORAL | 1 refills | Status: DC
Start: 2022-02-03 — End: 2022-03-12

## 2022-02-03 NOTE — Telephone Encounter (Signed)
NV 9/28

## 2022-02-03 NOTE — Progress Notes (Deleted)
[facility]  [address]  l  [address]  [place]  [address]  TEL: [phone]  l  FAX: [phone]      02/04/2022    PRIMARY CARE PROVIDER:  Alexa R Lindley, MD  [address] [place] [address]    PATIENT: Sean Oliver    [id number]    IDENTIFICATION/CHIEF CONCERN:    Sean Oliver is a 60 year old male return patient seen today for a full skin examination in the personal setting of [facility].    No chief complaint on file.     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 From [place]. History of sunburns on the scalp.     FAMILY HISTORY:  No immediate FHx of melanoma     PHYSICAL EXAMINATION:  Vital Signs: There were no vitals taken for this visit. ***  General: He is a well appearing adult male in no acute distress, who {bodytype:110606::"has a normal build"}. He {psych general LM:110609::"is pleasant with an appropriate mood and affect"} {with/without:106415::"without"} barriers to understanding.  Skin: exam performed included scalp, hair, face, neck, chest, abdomen, back, arms (right, left), forearms (right, left), thighs (right, left), legs (right, left), hands (right, left), feet (right, left), fingernails, toenails, buttocks, groin. 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  Recommendations are as follows:    There are no diagnoses linked to this encounter.    Return to clinic in *** for *** or sooner PRN acute concerns.    Procedure note? ***    ***scribe signature

## 2022-02-04 ENCOUNTER — Ambulatory Visit: Payer: No Typology Code available for payment source | Attending: Dermatology | Admitting: Dermatology

## 2022-02-04 DIAGNOSIS — D239 Other benign neoplasm of skin, unspecified: Secondary | ICD-10-CM | POA: Insufficient documentation

## 2022-02-04 DIAGNOSIS — L821 Other seborrheic keratosis: Secondary | ICD-10-CM | POA: Insufficient documentation

## 2022-02-04 DIAGNOSIS — Z85828 Personal history of other malignant neoplasm of skin: Secondary | ICD-10-CM | POA: Insufficient documentation

## 2022-02-04 DIAGNOSIS — Z1283 Encounter for screening for malignant neoplasm of skin: Secondary | ICD-10-CM

## 2022-02-04 DIAGNOSIS — L603 Nail dystrophy: Secondary | ICD-10-CM | POA: Insufficient documentation

## 2022-02-04 NOTE — Progress Notes (Addendum)
[facility] [place]  [address]  TEL: [phone]  l  FAX: [phone]      02/04/2022    PRIMARY CARE PROVIDER:  Matthew Saras, MD  [address] [place] [address]    PATIENT: Sean Oliver    [id number]    IDENTIFICATION/CHIEF CONCERN:    Sean Oliver is a 61 year old male return patient seen today for a full skin examination in the personal setting of [facility].    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 Does not Have new lesions of concern today. He has been living mostly in [place] He does not use sunscreen and is not interested in using it or discussing it. He does where a hat or visor most of the time in the sun. 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 He has had long standing CKD. 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 Lives with wife and kids (triplets, born 2001). From [place]. History of sunburns on the scalp.     FAMILY HISTORY:  No immediate FHx of melanoma      PHYSICAL EXAMINATION:  Skin: exam performed included scalp, hair, face, neck, chest, abdomen, back, arms (right, left), forearms (right, left), thighs (right, left), legs (right, left), hands (right, left), feet (right, left), fingernails, toenails,   Exam was remarkable for:    Face:  dark brown 1- to 3-mm papules consistent with dermatosis papulosa nigrans  Throughout exam dark brown stuck on verucus plaques concentrated on neck and check  LE: Well healed scar over L knee at site of prior SCC  Dark brown papules on Les consistent with Dermatofibroma on dermoscopy   Feet onycholysis of melanonychia of bilateral 1st toenails superficial desquamation with powdery, white scale    IMPRESSION/PLAN:  The following was discussed with the patient.  Recommendations are as follows:    1. History of squamous cell carcinoma of skin  Continue follow-up annually for serial exams no lesions of concern today  Sun protective practices were reviewed and recommended including sunscreen use (SPF 30+ broad spectrum), sun protective clothing and seeking shade.    2. Seborrheic keratosis  No treatment necessary    3. Dermatofibroma  No treatment necessary    4. Nail dystrophy - likely onychomycosis  Discussed treatment options such as terbinafine patient is not interested at this  time. He has a nail file at that he uses.  Discussed referral to podiatry if he is having trouble or pain with his toenails      Return to clinic in 12 mo for follow up of hx of SCC or sooner PRN acute concerns.    ------------------------------------------  Attending: Karlyne Greenspan, MD  I saw and evaluated the patient and agree with Dr.Ajamu Maxon's note in this encounter.  -------------------------------------------

## 2022-02-04 NOTE — Progress Notes (Deleted)
[facility] [place]  [address]  TEL: [phone]  l  FAX: [phone]      02/04/2022    PRIMARY CARE PROVIDER:  Matthew Saras, MD  [address] [place] [address]    PATIENT: Sean Oliver    [id number]    IDENTIFICATION/CHIEF CONCERN:    Sean Oliver is a 61 year old male return patient seen today for a full skin examination in the personal setting of [facility].    No chief complaint on file.     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 From [place]. History of sunburns on the scalp.     FAMILY HISTORY:  No immediate FHx of melanoma     PHYSICAL EXAMINATION:  Vital Signs: There were no vitals taken for this visit. ***  General: He is a well appearing adult male in no acute distress, who {bodytype:110606::"has a normal build"}. He {psych general LM:110609::"is pleasant with an appropriate mood and affect"} {with/without:106415::"without"} barriers to understanding.  Skin: exam performed included scalp, hair, face, neck, chest, abdomen, back, arms (right, left), forearms (right, left), thighs (right, left), legs (right, left), hands (right, left), feet (right, left), fingernails, toenails, buttocks, groin. 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  Recommendations are as follows:    There are no diagnoses linked to this encounter.    Return to clinic in *** for *** or sooner PRN acute concerns.    Procedure note? ***    ***scribe signature

## 2022-02-04 NOTE — Progress Notes (Signed)
If our office needs to contact you after your visit today, is it ok to leave a detailed message on your phone or e-care ?  YES      What is the preferred method of contact e-care/ phone  number?   Telephone Information:   Home Phone [phone]

## 2022-02-06 ENCOUNTER — Other Ambulatory Visit (INDEPENDENT_AMBULATORY_CARE_PROVIDER_SITE_OTHER): Payer: Self-pay | Admitting: Family Medicine

## 2022-02-06 DIAGNOSIS — I10 Essential (primary) hypertension: Secondary | ICD-10-CM

## 2022-02-07 ENCOUNTER — Other Ambulatory Visit (INDEPENDENT_AMBULATORY_CARE_PROVIDER_SITE_OTHER): Payer: Self-pay | Admitting: Family Medicine

## 2022-02-07 DIAGNOSIS — I10 Essential (primary) hypertension: Secondary | ICD-10-CM

## 2022-02-07 DIAGNOSIS — E785 Hyperlipidemia, unspecified: Secondary | ICD-10-CM

## 2022-02-09 MED ORDER — METOPROLOL SUCCINATE ER 25 MG OR TB24
EXTENDED_RELEASE_TABLET | ORAL | 0 refills | Status: DC
Start: 2022-02-09 — End: 2022-03-12

## 2022-02-10 MED ORDER — ATORVASTATIN CALCIUM 40 MG OR TABS
40.0000 mg | ORAL_TABLET | Freq: Every day | ORAL | 0 refills | Status: DC
Start: 2022-02-10 — End: 2022-03-12

## 2022-02-10 MED ORDER — LOSARTAN POTASSIUM 50 MG OR TABS
ORAL_TABLET | ORAL | 0 refills | Status: DC
Start: 2022-02-10 — End: 2022-03-12

## 2022-02-10 NOTE — Telephone Encounter (Signed)
NV AWV 9/28

## 2022-02-16 NOTE — Result Encounter Note (Signed)
Alexa has left and I assigned you as PCP, this is not a [facility] patient. Please follow up on these results. I sent patient a note.

## 2022-03-11 ENCOUNTER — Other Ambulatory Visit (HOSPITAL_BASED_OUTPATIENT_CLINIC_OR_DEPARTMENT_OTHER): Payer: Self-pay | Admitting: Nephrology

## 2022-03-11 ENCOUNTER — Ambulatory Visit: Payer: No Typology Code available for payment source | Attending: Nephrology

## 2022-03-11 ENCOUNTER — Other Ambulatory Visit (INDEPENDENT_AMBULATORY_CARE_PROVIDER_SITE_OTHER): Payer: PPO

## 2022-03-11 DIAGNOSIS — Z794 Long term (current) use of insulin: Secondary | ICD-10-CM | POA: Insufficient documentation

## 2022-03-11 DIAGNOSIS — E1122 Type 2 diabetes mellitus with diabetic chronic kidney disease: Secondary | ICD-10-CM

## 2022-03-11 DIAGNOSIS — N1832 Chronic kidney disease, stage 3b: Secondary | ICD-10-CM

## 2022-03-11 LAB — RENAL FUNCTION PANEL
Albumin: 4.3 g/dL (ref 3.5–5.2)
Anion Gap: 10 (ref 4–12)
Calcium: 9.4 mg/dL (ref 8.9–10.2)
Carbon Dioxide, Total: 24 meq/L (ref 22–32)
Chloride: 107 meq/L (ref 98–108)
Creatinine: 1.61 mg/dL — ABNORMAL HIGH (ref 0.51–1.18)
Glucose: 174 mg/dL — ABNORMAL HIGH (ref 62–125)
Phosphate: 3 mg/dL (ref 2.5–4.5)
Potassium: 4.5 meq/L (ref 3.6–5.2)
Sodium: 141 meq/L (ref 135–145)
Urea Nitrogen: 20 mg/dL (ref 8–21)
eGFR by CKD-EPI 2021: 49 mL/min/{1.73_m2} — ABNORMAL LOW (ref >59)

## 2022-03-11 LAB — URINALYSIS COMPLETE, URN
Bacteria, URN: NONE SEEN
Bilirubin (Qual), URN: NEGATIVE
Epith Cells_Renal/Trans,URN: NEGATIVE /HPF
Ketones, URN: NEGATIVE
Leukocyte Esterase, URN: NEGATIVE
Nitrite, URN: NEGATIVE
Occult Blood, URN: NEGATIVE
RBC, URN: NEGATIVE /HPF
Specific Gravity, URN: 1.016 g/mL (ref 1.006–1.027)
WBC, URN: NEGATIVE /HPF
pH, URN: 6.5 (ref 5.0–8.0)

## 2022-03-11 LAB — CBC (HEMOGRAM)
Hematocrit: 46 % (ref 38.0–50.0)
Hemoglobin: 15.3 g/dL (ref 13.0–18.0)
MCH: 29 pg (ref 27.3–33.6)
MCHC: 33.3 g/dL (ref 32.2–36.5)
MCV: 87 fL (ref 81–98)
Platelet Count: 327 10*3/uL (ref 150–400)
RBC: 5.27 10*6/uL (ref 4.40–5.60)
RDW-CV: 14.7 % — ABNORMAL HIGH (ref 11.0–14.5)
WBC: 5.78 10*3/uL (ref 4.3–10.0)

## 2022-03-11 LAB — PROTEIN/CREATININE RATIO, TIMED URINE
Creatinine/24H, Urine: UNDETERMINED mg/(24.h) (ref 1000–2000)
Creatinine/Unit, Urine: 71 mg/dL
Protein (Total), Urine: 75 mg/dL
Protein/Creatinine Ratio: 1.1 — ABNORMAL HIGH (ref <0.2)
Total Protein/24H, Urine: UNDETERMINED g/d (ref 0.05–0.08)

## 2022-03-11 LAB — PARATHYROID HORMONE: Parathyroid Hormone: 132 pg/mL — ABNORMAL HIGH (ref 12–88)

## 2022-03-12 ENCOUNTER — Ambulatory Visit (INDEPENDENT_AMBULATORY_CARE_PROVIDER_SITE_OTHER): Payer: No Typology Code available for payment source | Admitting: Family Medicine

## 2022-03-12 ENCOUNTER — Encounter (INDEPENDENT_AMBULATORY_CARE_PROVIDER_SITE_OTHER): Payer: Self-pay | Admitting: Family Medicine

## 2022-03-12 VITALS — BP 138/90 | HR 62 | Temp 98.0°F | Resp 16 | Ht 74.0 in | Wt 206.0 lb

## 2022-03-12 DIAGNOSIS — N1832 Chronic kidney disease, stage 3b: Secondary | ICD-10-CM

## 2022-03-12 DIAGNOSIS — E785 Hyperlipidemia, unspecified: Secondary | ICD-10-CM

## 2022-03-12 DIAGNOSIS — E1122 Type 2 diabetes mellitus with diabetic chronic kidney disease: Secondary | ICD-10-CM

## 2022-03-12 DIAGNOSIS — I10 Essential (primary) hypertension: Secondary | ICD-10-CM

## 2022-03-12 DIAGNOSIS — Z0000 Encounter for general adult medical examination without abnormal findings: Secondary | ICD-10-CM

## 2022-03-12 DIAGNOSIS — Z23 Encounter for immunization: Secondary | ICD-10-CM

## 2022-03-12 DIAGNOSIS — I129 Hypertensive chronic kidney disease with stage 1 through stage 4 chronic kidney disease, or unspecified chronic kidney disease: Secondary | ICD-10-CM

## 2022-03-12 DIAGNOSIS — Z794 Long term (current) use of insulin: Secondary | ICD-10-CM

## 2022-03-12 LAB — LAB ADD ON ORDER

## 2022-03-12 LAB — HEPATITIS B ABS - HBCAB,HBSAB
Hepatitis B Core Ab: NONREACTIVE
Hepatitis B Surf Antibody Intl Units: <8 m[IU]/mL
Hepatitis B Surface Ab: NONREACTIVE

## 2022-03-12 LAB — HEMOGLOBIN A1C, HPLC: Hemoglobin A1C: 7.6 % — ABNORMAL HIGH (ref 4.0–6.0)

## 2022-03-12 MED ORDER — ATORVASTATIN CALCIUM 40 MG OR TABS
40.0000 mg | ORAL_TABLET | Freq: Every day | ORAL | 3 refills | Status: DC
Start: 2022-03-12 — End: 2023-03-17

## 2022-03-12 MED ORDER — METOPROLOL SUCCINATE ER 25 MG OR TB24
EXTENDED_RELEASE_TABLET | ORAL | 3 refills | Status: DC
Start: 2022-03-12 — End: 2023-03-15

## 2022-03-12 MED ORDER — NIFEDIPINE ER 30 MG OR TB24
60.0000 mg | EXTENDED_RELEASE_TABLET | Freq: Two times a day (BID) | ORAL | 3 refills | Status: DC
Start: 2022-03-12 — End: 2023-03-30

## 2022-03-12 MED ORDER — EMPAGLIFLOZIN 25 MG OR TABS
25.0000 mg | ORAL_TABLET | Freq: Every day | ORAL | 3 refills | Status: DC
Start: 2022-03-12 — End: 2023-03-15

## 2022-03-12 MED ORDER — LOSARTAN POTASSIUM 50 MG OR TABS
ORAL_TABLET | ORAL | 3 refills | Status: DC
Start: 2022-03-12 — End: 2023-03-17

## 2022-03-12 MED ORDER — METFORMIN HCL 1000 MG OR TABS
1000.0000 mg | ORAL_TABLET | Freq: Two times a day (BID) | ORAL | 3 refills | Status: DC
Start: 2022-03-12 — End: 2023-03-15

## 2022-03-12 NOTE — Progress Notes (Signed)
Family Medicine Clinic Note    Chief Complaint  61 year old M w/ DM p/w wellness.    History of Present Illness  -here for wellness  -followed by renal for CKD  -also has RCC followed by uro    Past Medical History/Problem List  Patient Active Problem List    Diagnosis Date Noted    Kidney mass [N28.89] 10/03/2019    OSA (obstructive sleep apnea) [G47.33] 08/02/2018    CKD (chronic kidney disease) stage 3, GFR 30-59 ml/min ([place]) [N18.30] 04/26/2017    Proteinuria [R80.9] 04/26/2017    Other hyperlipidemia [E78.49] 04/26/2017    Essential hypertension [I10] 04/07/2017    Marijuana abuse [F12.10] 03/03/2016    Mediastinal lymphadenopathy [R59.0] 03/03/2016    Pulmonary nodule, right - repeat CT chest 12/2022 [R91.1] 03/03/2016     Stable on imaging 2014-2019.      History of squamous cell carcinoma of skin [Z85.828] 04/02/2015     L knee       Type 2 diabetes mellitus with stage 3b chronic kidney disease, with long-term current use of insulin (HCC) [E11.22, N18.32, Z79.4]      Well controlled per patient.  He gets his meds from [place].  Has regular eye exams.  Normal per patient.       Past Medical History:   Diagnosis Date    Cancer ([place])     Diabetes mellitus ([place])     Fracture     History of back injury     Hypertension     Kidney disease     Knee injury     Lipidemia     Lung disease     Other disorder of muscle, ligament, and fascia     Shoulder problem     Sprain, strain     Type II or unspecified type diabetes mellitus without mention of complication, not stated as uncontrolled      Past Surgical History:   Procedure Laterality Date    BACK SURGERY      HERNIA REPAIR      left eye surgery x 5      NO PRIOR SURGERIES      PR CHOLECYSTECTOMY      PR RPR INGUN HERNIA SLIDING ANY AGE      many years ago.  bilateral.    PR UNLISTED PROCEDURE FEMUR/KNEE         Medications  Outpatient Medications Prior to Visit   Medication Sig Dispense Refill    albuterol HFA 108 (90 Base) MCG/ACT inhaler Inhale 2 puffs by mouth every 4  hours as needed (cough or shortness of breath). 8.5 g 5    atorvastatin 40 MG tablet TAKE 1 TABLET (40 MG) BY MOUTH DAILY. DUE FOR APPOINTMENT AND LAB. 90 tablet 0    BD Pen Needle Nano U/F 32G X 4 MM miscellaneous Inject 1 each under the skin 4 times a day. Use one pen needle to inject insulin four times daily 400 each 1    blood glucose test strip 1 strip 3 times a day. Use to check blood sugar. 100 each 11    empagliflozin (Jardiance) 25 MG tablet TAKE 1 TABLET BY MOUTH ONCE DAILY 30 tablet 1    fluticasone propionate 50 MCG/ACT nasal spray Use two sprays in each nostril once daily 16 g 2    glucometer (OneTouch Verio) kit Use to monitor blood glucose. 1 each 0    insulin GLARGINE (Basaglar KwikPen) 100 UNIT/ML pen-injector INJECT 20 UNITS  UNDER THE SKIN EVERY MORNING 15 mL 3    lancet (OneTouch Delica) 25K Use 1 each 3 times a day. 100 each 11    losartan 50 MG tablet TAKE 1 TABLET (50 MG) BY MOUTH DAILY. -- DUE FOR APPOINTMENT AND LAB. 90 tablet 0    metFORMIN 1000 MG tablet TAKE 1 TABLET (1,000 MG) BY MOUTH 2 TIMES A DAY WITH MEALS. -- DUE FOR APPOINTMENT AND LAB. 60 tablet 1    metoprolol succinate ER 25 MG 24 hr tablet TAKE TWO TABLETS BY MOUTH DAILY  --- DO NOT CRUSH OR CHEW TABLETS 180 tablet 0    NIFEdipine ER 30 MG 24 hr tablet TAKE TWO TABLETS BY MOUTH TWICE DAILY 360 tablet 3    sildenafil 100 MG tablet TAKE ONE TABLET BY MOUTH ONCE DAILY AS NEEDED FOR ERECTILE DYSFUNTION 30 tablet 0     No facility-administered medications prior to visit.       Allergies  Review of patient's allergies indicates:  Allergies   Allergen Reactions    Peanuts Anaphylaxis       Family History  Family History       Problem (# of Occurrences) Relation (Name,Age of Onset)    Osteoporosis (1) Mother           Negative family history of: Cancer, Diabetes, Kidney Disorder, Hypertension, Stroke, Heart Disease            Social History  Social History     Socioeconomic History    Marital status: Married     Spouse name: Not on file     Number of children: Not on file    Years of education: Not on file    Highest education level: Not on file   Occupational History    Not on file   Tobacco Use    Smoking status: Former     Types: Cigars     Quit date: 05/29/2015     Years since quitting: 6.7    Smokeless tobacco: Never    Tobacco comments:     1-2 cigars/day for few years   Substance and Sexual Activity    Alcohol use: Not Currently     Comment: rarely    Drug use: No    Sexual activity: Yes     Partners: Female     Comment: Married 20 yrs    Other Topics Concern    Not on file   Social History Narrative    Married. Lives in [place]. Previously in [employer]. Retired from [employer].     Social Determinants of Health     Financial Resource Strain: Not on file   Food Insecurity: Not on file   Transportation Needs: Not on file   Physical Activity: Not on file   Stress: Not on file   Social Connections: Not on file   Intimate Partner Violence: Not on file   Housing Stability: Not on file     Review of Systems   Constitutional: Negative.    Psychiatric/Behavioral: Negative.       Physical Exam   Blood pressure 138/90, pulse 62, temperature 36.7 C, temperature source Temporal, resp. rate 16, height 6' 2"  (1.88 m), weight 93.4 kg (206 lb), SpO2 98 %.  Gen: AAO x3, in NAD, resting comfortably  HEENT: NC/AT, EOMI, PERRL, -LAD  CV: NL S1/S2, -M/R/G  Resp: CTA B/L, -W/C/R  Abd: soft, NTND  Ext: -C/C/E, 2+ DP pulses B/L, plantar sensation to light touch intact, no  ulcers    Assessment/Plan  61 year old M p/w:    1. Routine general medical examination at a health care facility  USPSTF recommendations reviewed.  - OCCULT BLOOD BY IA, STL; Future    2. Type 2 diabetes mellitus with stage 3b chronic kidney disease, with long-term current use of insulin (HCC)  Recent A1c close to goal; non-smoker, BP as below, eye exam reminded, foot exam, labs UTD, on statin, defer ASA, on ARB/SGLT2i, vaccines discussed.  - empagliflozin (Jardiance) 25 MG tablet; Take  1 tablet (25 mg) by mouth daily.  Dispense: 90 tablet; Refill: 3  - metFORMIN 1000 MG tablet; Take 1 tablet (1,000 mg) by mouth 2 times a day with meals.  Dispense: 180 tablet; Refill: 3    3. Essential hypertension  BP close to goal of 130-140/80-90. Refill regimen; consider increasing ARB and decreasing BB in future as no h/o CAD.  - losartan 50 MG tablet; TAKE 1 TABLET (50 MG) BY MOUTH DAILY. -- DUE FOR APPOINTMENT AND LAB.  Dispense: 90 tablet; Refill: 3  - NIFEdipine ER 30 MG 24 hr tablet; Take 2 tablets (60 mg) by mouth 2 times a day.  Dispense: 360 tablet; Refill: 3  - metoprolol succinate ER 25 MG 24 hr tablet; TAKE TWO TABLETS BY MOUTH DAILY  --- DO NOT CRUSH OR CHEW TABLETS  Dispense: 180 tablet; Refill: 3    4. Hyperlipidemia, unspecified hyperlipidemia type  Refill.  - atorvastatin 40 MG tablet; Take 1 tablet (40 mg) by mouth daily.  Dispense: 90 tablet; Refill: 3    5. Need for vaccination  Check HBV immunity.  - LAB ADD ON ORDER    Follow-up: PRN    Dillard Essex, MD  Family Medicine  [facility]

## 2022-03-12 NOTE — Patient Instructions (Signed)
1. Eat more vegetables and fruit (majority of what you eat, at least half your plate).    2. Make your protein lean (beans, nuts, fish, white meat).    3. Make your carbohydrates whole wheat and minimize the amount you eat (pastas, breads, cereals, rice).    Try to exercise at least 150 minutes per week with aerobic activity that breaks a sweat!    -hepatitis

## 2022-03-12 NOTE — Progress Notes (Signed)
Patient Rooming (in-clinic or Telemed): in clinic    Reason for Visit:   Chief Complaint   Patient presents with    Wellness         Refills? NO  Referral? NO  Letter or Form? NO  Lab Results? NO    HEALTH MAINTENANCE:  Has the patient had this done since their last visit?  Cervical screening/PAP: N/A  Mammo: N/A  Colon Screen: N/A    Have you seen a specialist since your last visit: No    Vaccines Due? Yes, Hep B vaccine, Zoster, Pcv-20, covid booster, tdap, flu    HM Due:   Health Maintenance   Topic Date Due    Skin Cancer Monitoring for High Risk Patients  Never done    Hepatitis B Vaccine (1 of 3 - 3-dose series) Never done    Diabetes Eye Exam  Never done    Diabetes Kidney Health Evaluation  Never done    Colorectal Cancer Screening  Never done    Zoster Vaccine (1 of 2) Never done    Pneumococcal Vaccine: Pediatrics (0-5 years) and At-Risk Patients (6-64 years) (2 - PCV) 03/18/2016    Depression Screening (PHQ-2)  03/04/2018    COVID-19 Vaccine (4 - Pfizer series) 07/10/2020    DTaP, Tdap and Td Vaccines (4 - Td or Tdap) 02/07/2021    Diabetes Foot Exam  07/16/2021    Influenza Vaccine (1) 03/15/2022    Lipid Disorders Screening  04/07/2022    Diabetes A1c  09/09/2022    Hepatitis C Screening  Completed    HIV Screening  Completed    Hepatitis A Vaccine  Aged Out       PCP Verified?  Yes, No primary care provider on file.

## 2022-03-13 LAB — LAB ADD ON ORDER

## 2022-03-13 LAB — LIPID PANEL
Cholesterol/HDL Ratio: 3
HDL Cholesterol: 32 mg/dL — ABNORMAL LOW (ref >39)
LDL Cholesterol, NIH Equation: 41 mg/dL (ref <130)
Non-HDL Cholesterol: 63 mg/dL (ref 0–159)
Total Cholesterol: 95 mg/dL (ref <200)
Triglyceride: 118 mg/dL (ref <150)

## 2022-03-16 ENCOUNTER — Other Ambulatory Visit (INDEPENDENT_AMBULATORY_CARE_PROVIDER_SITE_OTHER): Payer: Self-pay | Admitting: Family Medicine

## 2022-03-16 DIAGNOSIS — Z0000 Encounter for general adult medical examination without abnormal findings: Secondary | ICD-10-CM

## 2022-03-17 LAB — OCCULT BLOOD BY IA, STL: Occult Bld 1 Result: NEGATIVE

## 2022-03-17 NOTE — Result Encounter Note (Signed)
Released via [place], see message from 03/17/22.

## 2022-03-20 ENCOUNTER — Encounter (HOSPITAL_BASED_OUTPATIENT_CLINIC_OR_DEPARTMENT_OTHER): Payer: Self-pay

## 2022-04-06 ENCOUNTER — Other Ambulatory Visit (INDEPENDENT_AMBULATORY_CARE_PROVIDER_SITE_OTHER): Payer: Self-pay | Admitting: Family Medicine

## 2022-04-06 DIAGNOSIS — N529 Male erectile dysfunction, unspecified: Secondary | ICD-10-CM

## 2022-04-07 NOTE — Telephone Encounter (Signed)
Medication not previously Rxed by provider.  Continue?

## 2022-04-08 MED ORDER — SILDENAFIL CITRATE 100 MG OR TABS
ORAL_TABLET | ORAL | 0 refills | Status: DC
Start: 2022-04-08 — End: 2022-05-18

## 2022-05-17 ENCOUNTER — Encounter (INDEPENDENT_AMBULATORY_CARE_PROVIDER_SITE_OTHER): Payer: Self-pay | Admitting: Family Medicine

## 2022-05-17 DIAGNOSIS — N529 Male erectile dysfunction, unspecified: Secondary | ICD-10-CM

## 2022-05-18 ENCOUNTER — Telehealth (INDEPENDENT_AMBULATORY_CARE_PROVIDER_SITE_OTHER): Payer: Self-pay

## 2022-05-18 MED ORDER — SILDENAFIL CITRATE 100 MG OR TABS
ORAL_TABLET | ORAL | 0 refills | Status: DC
Start: 2022-05-18 — End: 2022-07-01

## 2022-05-26 NOTE — Telephone Encounter (Signed)
Prior Authorization Request Pending    Prescription Insurance: [employer] [facility])   Pharmacy: [facility] [phone] [facility] [employer] [phone] [place] [phone]    Medication: Sildenafil Citrate 100 MG Oral Tablet 30/30  PAC Workflow Exemption Request: No  Date Submitted: 05/26/22    Type of Request:  '[x]'$  Cover My Meds Key: [id number]    After PAC has changed the status of the request to pending, please allow a minimum of 3 to 5 business days for the insurance to respond (some insurances may take longer).

## 2022-05-27 ENCOUNTER — Encounter (HOSPITAL_BASED_OUTPATIENT_CLINIC_OR_DEPARTMENT_OTHER): Payer: Self-pay

## 2022-05-27 ENCOUNTER — Encounter (HOSPITAL_BASED_OUTPATIENT_CLINIC_OR_DEPARTMENT_OTHER): Payer: Self-pay | Admitting: Nephrology

## 2022-05-27 ENCOUNTER — Ambulatory Visit: Payer: No Typology Code available for payment source | Admitting: Nephrology

## 2022-05-27 DIAGNOSIS — I10 Essential (primary) hypertension: Secondary | ICD-10-CM

## 2022-05-27 DIAGNOSIS — I129 Hypertensive chronic kidney disease with stage 1 through stage 4 chronic kidney disease, or unspecified chronic kidney disease: Secondary | ICD-10-CM

## 2022-05-27 DIAGNOSIS — N1831 Chronic kidney disease, stage 3a: Secondary | ICD-10-CM

## 2022-05-27 DIAGNOSIS — N2581 Secondary hyperparathyroidism of renal origin: Secondary | ICD-10-CM

## 2022-05-27 DIAGNOSIS — N2889 Other specified disorders of kidney and ureter: Secondary | ICD-10-CM

## 2022-05-27 DIAGNOSIS — Z794 Long term (current) use of insulin: Secondary | ICD-10-CM

## 2022-05-27 DIAGNOSIS — E1122 Type 2 diabetes mellitus with diabetic chronic kidney disease: Secondary | ICD-10-CM

## 2022-05-27 DIAGNOSIS — C641 Malignant neoplasm of right kidney, except renal pelvis: Secondary | ICD-10-CM

## 2022-05-27 NOTE — Progress Notes (Signed)
[facility] Telemedicine Encounter  I conducted this encounter via secure, live, face-to-face video conference with the patient. I reviewed the risks and benefits of telemedicine as pertinent to this visit and the patient agreed to proceed.    Provider Location: On-site location (clinic, hospital, on-site office)  Patient Location: At home  Present with patient:  Wife        [place] [facility] NOTE  05/27/2022            REFERRING PHYSICIAN:   Morhaf Al Achkar     INITIAL CONSULT DATE: 05/28/2017      Past Medical History:   Diagnosis Date    Cancer ([place])     Diabetes mellitus ([place])     Fracture     History of back injury     Hypertension     Kidney disease     Knee injury     Lipidemia     Lung disease     Other disorder of muscle, ligament, and fascia     Shoulder problem     Sprain, strain     Type II or unspecified type diabetes mellitus without mention of complication, not stated as uncontrolled      Past Surgical History:   Procedure Laterality Date    BACK SURGERY      HERNIA REPAIR      left eye surgery x 5      NO PRIOR SURGERIES      PR CHOLECYSTECTOMY      PR RPR INGUN HERNIA SLIDING ANY AGE      many years ago.  bilateral.    PR UNLISTED PROCEDURE FEMUR/KNEE          HISTORY OF PRESENT ILLNESS: Sean Oliver is a 61 year old male with past medical history significant for the above mentioned problems.    CC: Follow up on CKD and Hypertension     Based on verbal report, past medical history is significant for:    Diabetes mellitus of at least 15 years duration, though has been very well controlled over this period of time.  He was initially started off on insulin but subsequently has been switched to metformin only with good glycemic control.  By his report, he does not know of any end organ damage including absence of any diabetic retinopathy or neuropathy.  Also has long-standing hypertension of at least 20 years duration.  While he has not checked his pressures regularly at home, he reports that  for the most part at doctors visits he was told that the blood pressure is only marginally elevated.  Prolonged time he was on combination lisinopril with hydrochlorothiazide, that was changed subsequently to alternative agents.  One reasoning for stopping lisinopril was to determine if it would lead to an improvement in creatinine/GFR.  However, over the last few months patient has been experiencing uncontrolled hypertension with most readings in the 160s-170s/90s-100 range.  He is not specifically expressing any symptoms related to these uncontrolled readings and denies any history of TIAs/CVAs.  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  Also had considerable issues with diverticulitis leading to laparotomy with limited resection as well in the past.    During the recent follow-ups with primary care physician and in effort to undertake a diagnostic evaluation for his chronic kidney disease, both urinary and imaging studies were undertaken.  Urinalyses now revealing increased albuminuria while imaging studies including ultrasound, CT  scan and MRI also indicating around 2 cm left-sided interpolar mass most compatible with renal cell carcinoma.  He has not experienced any specific flank pains, dysuria, renal colic or hematuria.  Similarly no major change in urinary habit and no problems with dependent edema.    No history of nephrolithiasis, recurrent urinary tract infections or pyelonephritis.  Only occasional use of NSAIDs with no over-the-counter/herbal supplement use.      Hospitalization between 2/1-07/19/2019 for hyperglycemic hyperosmolar syndrome (HHS) as well as accelerated hypertension.  Since that hospitalization is management has been modified with initiation of empagliflozin and insulin.  He was followed by diabetes clinic after that.  For hypertension, lisinopril was previously switched to losartan on the complaints of a  cough.       Interval History:  Has not been checking his blood pressures at home very frequently; overall clinic readings showing a bit of fluctuation, but last reading more stable. Reports that he has been very compliant with his medications that his wife sets up for him. He also reports that there has been no change in his medications or dosages in the interim.  Denying any headaches, visual changes or postural problems.  No history of dependent edema.    Since initiation of empagliflozin, he does not describe any significant change in his urinary output.  Has not experienced any issues with urinary tract infections or other genital problems.     Previously encountered nocturia has dissipated now that he is using CPAP regularly.      Most significantly in consultation with Dr. 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 He has opted for close surveillance with MRI scans.  Last MRI and urology was it was July 2023 at which time no significant change in the size of the mass was seen and plan was to continue with ongoing surveillance with imaging.     He has not experienced any dyspnea, chest pain, visual problems or dependent edema.          ALLERGIES:    Review of patient's allergies indicates:  Allergies   Allergen Reactions    Peanuts Anaphylaxis       HOME MEDICATIONS:      Current Outpatient Medications:     albuterol HFA 108 (90 Base) MCG/ACT inhaler, Inhale 2 puffs by mouth every 4 hours as needed (cough or shortness of breath)., Disp: 8.5 g, Rfl: 5    atorvastatin 40 MG tablet, Take 1 tablet (40 mg) by mouth daily., Disp: 90 tablet, Rfl: 3    BD Pen Needle Nano U/F 32G X 4 MM miscellaneous, Inject 1 each under the skin 4 times a day. Use one pen needle to inject insulin four times daily, Disp: 400 each, Rfl: 1    blood glucose test strip, 1 strip 3 times a day. Use to check blood sugar., Disp: 100 each, Rfl: 11    empagliflozin (Jardiance) 25  MG tablet, Take 1 tablet (25 mg) by mouth daily., Disp: 90 tablet, Rfl: 3    fluticasone propionate 50 MCG/ACT nasal spray, Use two sprays in each nostril once daily, Disp: 16 g, Rfl: 2    glucometer (OneTouch Verio) kit, Use to monitor blood glucose., Disp: 1 each, Rfl: 0    lancet (OneTouch Delica) 08Q, Use 1 each 3 times a day., Disp: 100 each, Rfl: 11    losartan 50 MG tablet, TAKE 1 TABLET (50 MG) BY MOUTH DAILY. -- DUE  FOR APPOINTMENT AND LAB., Disp: 90 tablet, Rfl: 3    metFORMIN 1000 MG tablet, Take 1 tablet (1,000 mg) by mouth 2 times a day with meals., Disp: 180 tablet, Rfl: 3    metoprolol succinate ER 25 MG 24 hr tablet, TAKE TWO TABLETS BY MOUTH DAILY  --- DO NOT CRUSH OR CHEW TABLETS, Disp: 180 tablet, Rfl: 3    NIFEdipine ER 30 MG 24 hr tablet, Take 2 tablets (60 mg) by mouth 2 times a day., Disp: 360 tablet, Rfl: 3    sildenafil 100 MG tablet, Take 1 tablet (100 mg) by mouth daily as needed for erectile dysfunction, Disp: 30 tablet, Rfl: 0        PHYSICAL EXAM:     Vitals Signs: There were no vitals taken for this visit..  This was a telehealth visit.     DIAGNOSTIC STUDIES:                      MRI 01/10/2022:    Right kidney: Multiple simple cysts. No suspicious lesion.  Left kidney: The T2-isointense to hypointense, hypoenhancing, partially exophytic (less than 50%) mass with associated restricted diffusion in the posterior aspect of the left interpolar kidney measures 2.0 x 1.3 x 1.7 cm (series 1004/#61, series 1201/#124), previously 1.7 x 1.7 x 1.9 cm. The lesion is approximately 13 mm from the collecting system and is situated entirely between the polar lines of the kidney. Additional simple renal cysts.  Renal vasculature: Single bilateral renal arteries and no renal venous tumor invasion.    Visualized ureters: Normal.        Renal Mass Biopsy 11/05/2020:  Final Diagnosis   A) Left renal mass, biopsy:                - Chromophobe renal cell carcinoma.          ASSESSMENT AND PLAN:    -Chronic  Kidney Disease Stage 3A  Review of available labs since at least 2015 show a creatinine ranging from 1.27-1.61 (and one value obtained through [facility] from 04/28/1996 of 1.86m/dL).  This indicates long-standing element of chronic kidney disease with eGFR likely in the ~ 50 mL/minute range albeit a more or less stable course over this period of time with minimal progression.  Last serum creatinine at 1.61 with eGFR 429mminute, that indicates overall stable status without noticeable progression.      Albuminuria continues with variable severity [id number] overall stable total proteinuria of ~1g.  Continues on losartan with empagliflozin.  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  Ultrasound of the kidneys is instructive in this regard indicating increased echogenicity from interstitial fibrosis.  Previously, a monoclonal disorder was ruled out with urine and serum electrophoresis studies as well as free light chain assays.      Needs continued aggressive risk factor modification (see below).    -Hypertension, Primary  Unfortunately not checking home readings frequently.  Given overall stable clinic readings, continue on current regimen of nifedipine XL 60 mg twice daily, metoprolol extended release 50 mg daily and losartan 50 mg daily.  I have instructed him to start checking his blood pressures regularly at home for additional antihypertensive modification.       -Diabetes mellitus type 2, uncontrolled  After course of relatively stable control of diabetes mellitus, was hospitalized in early February 2021 with hyperglycemic hyperosmolar  syndrome requiring initiation of insulin.  Since that time he has also been started on empagliflozin.  Last hemoglobin A1c now considerably improved down to 7.6% from a high of 13%.     Continue losartan and empagliflozin  for maximum anti-proteinuric effect.         -Renal cell carcinoma   Imaging studies showed a 2 cm lesion in the interpolar region of the left kidney. On the basis of recent MRI 09/14/2020 there appeared to be slight progression to now 2.3 cm lesion.    In consultation with Dr. 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 He has opted for close surveillance with MRI scans. Last MRI and urology was it was July 2023 at which time no significant change in the size of the mass was seen and plan was to continue with ongoing surveillance with imaging.            Return to clinic in 6 months with CBC, renal function panel, urinalysis with albumin to creatinine ratio, iPTH.      Sean Hausen, MD, 05/27/2022  Division of Nephrology  [facility]  [place], [place]

## 2022-05-27 NOTE — Telephone Encounter (Signed)
Prior Authorization Denial    The patient's insurance has denied coverage of: Sildenafil Citrate    Options for clinic:    '[x]'$  Patient may pay out of pocket for this item    '[x]'$  Clinic may appeal.  (Within 180 days of 05/26/22)   Prescription Claim Appeals [facility] [place]  [address] [place] [address]  Fax: [phone]    Please note: The Prior [facility]) does not take part in the appeal process. Appeals are completed by prescriber or clinic staff. Please refer to the denial letter, which has been uploaded into Environmental health practitioner, for reference of how to start the appeal process.      Prescription Insurance: [employer] [facility])   Pharmacy: [facility] [phone] [facility] [employer] [phone] [place] [phone]    Medication: Sildenafil Citrate 100 MG Oral Tablet 30/30    Reason for Denial: We have denied your request because it is for more than the amount your plan covers (quantity limit). We reviewed the information we had. We have partially approved your request for this drug up to the amount your plan covers up to 6 tablets per month of Viagra.    Denial # or code: [id number]      Additional Comments:  Denial letter has been uploaded to Media.

## 2022-05-28 ENCOUNTER — Encounter (INDEPENDENT_AMBULATORY_CARE_PROVIDER_SITE_OTHER): Payer: Self-pay | Admitting: Family Medicine

## 2022-05-29 NOTE — Telephone Encounter (Signed)
Routed [place] denial to prescribing provider.  Denial due to quantity limit.  Please let us know if you wish to appeal this denial and we will route to [facility] Referral Team who will initiate the appeal.

## 2022-06-28 ENCOUNTER — Other Ambulatory Visit (INDEPENDENT_AMBULATORY_CARE_PROVIDER_SITE_OTHER): Payer: Self-pay | Admitting: Family Medicine

## 2022-06-28 DIAGNOSIS — N529 Male erectile dysfunction, unspecified: Secondary | ICD-10-CM

## 2022-07-01 MED ORDER — SILDENAFIL CITRATE 100 MG OR TABS
ORAL_TABLET | ORAL | 2 refills | Status: DC
Start: 2022-07-01 — End: 2023-08-04

## 2022-08-20 ENCOUNTER — Telehealth (HOSPITAL_BASED_OUTPATIENT_CLINIC_OR_DEPARTMENT_OTHER): Payer: Self-pay | Admitting: Dermatology

## 2022-08-20 NOTE — Telephone Encounter (Signed)
RETURN CALL: Voicemail - Detailed Message      SUBJECT:  Appointment Request     REASON FOR VISIT: 12 month follow up  PREFERRED DATE/TIME: August 2024  REASON UNABLE TO APPOINT: Epic would not let CCR schedule. CCR attempted warm transfer but clinic busy helping other patients.

## 2022-08-31 ENCOUNTER — Encounter (HOSPITAL_BASED_OUTPATIENT_CLINIC_OR_DEPARTMENT_OTHER): Payer: Self-pay

## 2022-10-23 ENCOUNTER — Telehealth (INDEPENDENT_AMBULATORY_CARE_PROVIDER_SITE_OTHER): Payer: Self-pay

## 2022-10-23 NOTE — Telephone Encounter (Signed)
HCC Refresh needed. Screenings: Colon. DM: A1c due, A1c uncontrolled, BP, Eye, Kidney

## 2022-10-23 NOTE — Telephone Encounter (Signed)
1st attempt; OBC made to schedule patient for follow-up and to establish with new PCP. Unable to make contact and left a VM to return our call.    CCR. if the patient returns our call please assist in scheduling with a provider accepting new patients.

## 2022-11-02 NOTE — Telephone Encounter (Signed)
2nd attempt; OBC made to schedule patient for follow-up and to establish with new PCP. Unable to make contact and left a VM to return our call.    CCR. if the patient returns our call please assist in scheduling with a provider accepting new patients.

## 2022-11-11 ENCOUNTER — Other Ambulatory Visit (HOSPITAL_BASED_OUTPATIENT_CLINIC_OR_DEPARTMENT_OTHER): Payer: Self-pay

## 2022-11-11 ENCOUNTER — Encounter (HOSPITAL_BASED_OUTPATIENT_CLINIC_OR_DEPARTMENT_OTHER): Payer: Self-pay

## 2022-11-11 DIAGNOSIS — N1831 Chronic kidney disease, stage 3a: Secondary | ICD-10-CM

## 2022-11-13 NOTE — Telephone Encounter (Signed)
final attempt; OBC made to schedule patient for follow-up and to establish with new PCP. Unable to make contact and left a VM to return our call.    CCR. if the patient returns our call please assist in scheduling with a provider accepting new patients.

## 2022-11-16 ENCOUNTER — Telehealth (HOSPITAL_BASED_OUTPATIENT_CLINIC_OR_DEPARTMENT_OTHER): Payer: Self-pay

## 2022-11-16 NOTE — Telephone Encounter (Signed)
Left message asking Sean Oliver to have labs done prior to his appointment on 6/10.

## 2022-11-20 ENCOUNTER — Ambulatory Visit: Payer: No Typology Code available for payment source | Attending: Nephrology

## 2022-11-20 ENCOUNTER — Other Ambulatory Visit (HOSPITAL_BASED_OUTPATIENT_CLINIC_OR_DEPARTMENT_OTHER): Payer: Self-pay

## 2022-11-20 ENCOUNTER — Other Ambulatory Visit (INDEPENDENT_AMBULATORY_CARE_PROVIDER_SITE_OTHER): Payer: No Typology Code available for payment source

## 2022-11-20 DIAGNOSIS — N1831 Chronic kidney disease, stage 3a: Secondary | ICD-10-CM

## 2022-11-20 DIAGNOSIS — N1832 Chronic kidney disease, stage 3b: Secondary | ICD-10-CM

## 2022-11-20 LAB — URINALYSIS COMPLETE, URN
Bacteria, URN: NONE SEEN
Bilirubin (Qual), URN: NEGATIVE
Epith Cells_Renal/Trans,URN: NEGATIVE /HPF
Ketones, URN: NEGATIVE
Leukocyte Esterase, URN: NEGATIVE
Nitrite, URN: NEGATIVE
RBC, URN: NEGATIVE /HPF
Specific Gravity, URN: 1.025 g/mL (ref 1.006–1.027)
WBC, URN: NEGATIVE /HPF
pH, URN: 6 (ref 5.0–8.0)

## 2022-11-20 LAB — PARATHYROID HORMONE: Parathyroid Hormone: 92 pg/mL — ABNORMAL HIGH (ref 12–88)

## 2022-11-20 LAB — RENAL FUNCTION PANEL
Albumin: 4.6 g/dL (ref 3.5–5.2)
Anion Gap: 9 (ref 4–12)
Calcium: 9.5 mg/dL (ref 8.9–10.2)
Carbon Dioxide, Total: 22 meq/L (ref 22–32)
Chloride: 109 meq/L — ABNORMAL HIGH (ref 98–108)
Creatinine: 1.39 mg/dL — ABNORMAL HIGH (ref 0.51–1.18)
Glucose: 143 mg/dL — ABNORMAL HIGH (ref 62–125)
Phosphate: 2.7 mg/dL (ref 2.5–4.5)
Potassium: 3.6 meq/L (ref 3.6–5.2)
Sodium: 140 meq/L (ref 135–145)
Urea Nitrogen: 18 mg/dL (ref 8–21)
eGFR by CKD-EPI 2021: 58 mL/min/{1.73_m2} — ABNORMAL LOW (ref >59)

## 2022-11-20 LAB — CBC (HEMOGRAM)
Hematocrit: 47 % (ref 38.0–50.0)
Hemoglobin: 15.6 g/dL (ref 13.0–18.0)
MCH: 28.9 pg (ref 27.3–33.6)
MCHC: 33.4 g/dL (ref 32.2–36.5)
MCV: 87 fL (ref 81–98)
Platelet Count: 309 10*3/uL (ref 150–400)
RBC: 5.4 10*6/uL (ref 4.40–5.60)
RDW-CV: 15.1 % — ABNORMAL HIGH (ref 11.0–14.5)
WBC: 6.74 10*3/uL (ref 4.3–10.0)

## 2022-11-20 LAB — ALBUMIN/CREATININE RATIO, RANDOM URINE
Albumin (Micro), URN: 184 mg/dL
Albumin/Creatinine Ratio, URN: 2140 mg/g{creat} — ABNORMAL HIGH (ref <30)
Creatinine/Unit, URN: 86 mg/dL

## 2022-11-20 NOTE — Progress Notes (Signed)
Courtesy blood draw for Dr. Danelle Earthly at [facility].    Tests drawn: CBC,IPTH,RENFP,UAC,UMALSP,

## 2022-11-23 ENCOUNTER — Ambulatory Visit: Payer: No Typology Code available for payment source | Admitting: Nephrology

## 2022-11-23 DIAGNOSIS — N2889 Other specified disorders of kidney and ureter: Secondary | ICD-10-CM

## 2022-11-23 DIAGNOSIS — E1122 Type 2 diabetes mellitus with diabetic chronic kidney disease: Secondary | ICD-10-CM

## 2022-11-23 DIAGNOSIS — N1832 Long term (current) use of insulin: Secondary | ICD-10-CM

## 2022-11-23 DIAGNOSIS — N1831 Chronic kidney disease, stage 3a: Secondary | ICD-10-CM

## 2022-11-23 DIAGNOSIS — I10 Essential (primary) hypertension: Secondary | ICD-10-CM

## 2022-11-23 DIAGNOSIS — N2581 Secondary hyperparathyroidism of renal origin: Secondary | ICD-10-CM

## 2022-11-23 DIAGNOSIS — Z794 Long term (current) use of insulin: Secondary | ICD-10-CM

## 2022-11-23 DIAGNOSIS — I129 Hypertensive chronic kidney disease with stage 1 through stage 4 chronic kidney disease, or unspecified chronic kidney disease: Secondary | ICD-10-CM

## 2022-11-23 NOTE — Progress Notes (Signed)
Distant Site Telemedicine Encounter  I conducted this encounter via secure, live, face-to-face video conference with the patient. I reviewed the risks and benefits of telemedicine as pertinent to this visit and the patient agreed to proceed.    Provider Location: On-site location (clinic, hospital, on-site office)  Patient Location: At home  Present with patient:  Wife        NEPHROLOGY CLINIC FOLLOW UP NOTE  11/23/2022            REFERRING PHYSICIAN:   Morhaf Al Achkar     INITIAL CONSULT DATE: 05/28/2017      Past Medical History:   Diagnosis Date    Cancer (HCC)     Diabetes mellitus (HCC)     Fracture     History of back injury     Hypertension     Kidney disease     Knee injury     Lipidemia     Lung disease     Other disorder of muscle, ligament, and fascia     Shoulder problem     Sprain, strain     Type II or unspecified type diabetes mellitus without mention of complication, not stated as uncontrolled      Past Surgical History:   Procedure Laterality Date    BACK SURGERY      HERNIA REPAIR      left eye surgery x 5      NO PRIOR SURGERIES      PR CHOLECYSTECTOMY      PR RPR INGUN HERNIA SLIDING ANY AGE      many years ago.  bilateral.    PR UNLISTED PROCEDURE FEMUR/KNEE          HISTORY OF PRESENT ILLNESS: Sean Oliver is a 62 year old male with past medical history significant for the above mentioned problems.    CC: Follow up on CKD and Hypertension     Based on verbal report, past medical history is significant for:    Diabetes mellitus of 15+ years duration, though has been very well controlled over this period of time.  He was initially started off on insulin but subsequently has been switched to metformin only with good glycemic control.  By his report, he does not know of any end organ damage including absence of any diabetic retinopathy or neuropathy.  Also has long-standing hypertension of at least 20 years duration.  While he has not checked his pressures regularly at home, he reports that for the  most part at doctors visits he was told that the blood pressure is only marginally elevated.  Prolonged time he was on combination lisinopril with hydrochlorothiazide, that was changed subsequently to alternative agents.  One reasoning for stopping lisinopril was to determine if it would lead to an improvement in creatinine/GFR.  However, over the last few months patient has been experiencing uncontrolled hypertension with most readings in the 160s-170s/90s-100 range.  He is not specifically expressing any symptoms related to these uncontrolled readings and denies any history of TIAs/CVAs.  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  Also had considerable issues with diverticulitis leading to laparotomy with limited resection as well in the past.    During the recent follow-ups with primary care physician and in effort to undertake a diagnostic evaluation for his chronic kidney disease, both urinary and imaging studies were undertaken.  Urinalyses now revealing increased albuminuria while imaging studies including ultrasound, CT scan and  MRI also indicating around 2 cm left-sided interpolar mass most compatible with renal cell carcinoma.  He has not experienced any specific flank pains, dysuria, renal colic or hematuria.  Similarly no major change in urinary habit and no problems with dependent edema.    No history of nephrolithiasis, recurrent urinary tract infections or pyelonephritis.  Only occasional use of NSAIDs with no over-the-counter/herbal supplement use.      Hospitalization between 2/1-07/19/2019 for hyperglycemic hyperosmolar syndrome (HHS) as well as accelerated hypertension.  Since that hospitalization is management has been modified with initiation of empagliflozin and insulin.  He was followed by diabetes clinic after that.  For hypertension, lisinopril was previously switched to losartan on the complaints of a cough.        Interval History:  Has not been checking his blood pressures at home very frequently; overall clinic readings showing a bit of fluctuation, and last readings from September 2023. Reports that he has been very compliant with his medications that his wife sets up for him. These were cross checked on today's visit with his wife.  He also reports that there has been no change in his medications or dosages in the interim.  Denying any headaches, visual changes or postural problems.  No history of dependent edema.    Since initiation of empagliflozin, he does not describe any significant change in his urinary output.  Has not experienced any issues with urinary tract infections or other genital problems.     Previously encountered nocturia has dissipated now that he is using CPAP regularly.      Most significantly in consultation with Dr. 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 He has opted for close surveillance with MRI scans.  Last MRI and urology was it was July 2023 at which time no significant change in the size of the mass was seen and plan was to continue with ongoing surveillance with imaging.     He has not experienced any dyspnea, chest pain, visual problems or dependent edema.          ALLERGIES:    Review of patient's allergies indicates:  Allergies   Allergen Reactions    Peanuts Anaphylaxis       HOME MEDICATIONS:      Current Outpatient Medications:     albuterol HFA 108 (90 Base) MCG/ACT inhaler, Inhale 2 puffs by mouth every 4 hours as needed (cough or shortness of breath)., Disp: 8.5 g, Rfl: 5    atorvastatin 40 MG tablet, Take 1 tablet (40 mg) by mouth daily., Disp: 90 tablet, Rfl: 3    BD Pen Needle Nano U/F 32G X 4 MM miscellaneous, Inject 1 each under the skin 4 times a day. Use one pen needle to inject insulin four times daily, Disp: 400 each, Rfl: 1    blood glucose test strip, 1 strip 3 times a day. Use to check blood  sugar., Disp: 100 each, Rfl: 11    empagliflozin (Jardiance) 25 MG tablet, Take 1 tablet (25 mg) by mouth daily., Disp: 90 tablet, Rfl: 3    fluticasone propionate 50 MCG/ACT nasal spray, Use two sprays in each nostril once daily, Disp: 16 g, Rfl: 2    glucometer (OneTouch Verio) kit, Use to monitor blood glucose., Disp: 1 each, Rfl: 0    lancet (OneTouch Delica) 33g, Use 1 each 3 times a day., Disp: 100 each, Rfl: 11    losartan 50 MG tablet,  TAKE 1 TABLET (50 MG) BY MOUTH DAILY. -- DUE FOR APPOINTMENT AND LAB., Disp: 90 tablet, Rfl: 3    metFORMIN 1000 MG tablet, Take 1 tablet (1,000 mg) by mouth 2 times a day with meals., Disp: 180 tablet, Rfl: 3    metoprolol succinate ER 25 MG 24 hr tablet, TAKE TWO TABLETS BY MOUTH DAILY  --- DO NOT CRUSH OR CHEW TABLETS, Disp: 180 tablet, Rfl: 3    NIFEdipine ER 30 MG 24 hr tablet, Take 2 tablets (60 mg) by mouth 2 times a day., Disp: 360 tablet, Rfl: 3    sildenafil 100 MG tablet, TAKE 1 TABLET BY MOUTH EVERY DAY AS NEEDED FOR ERECTILE DYSFUNCTION, Disp: 30 tablet, Rfl: 2        PHYSICAL EXAM:     Vitals Signs: There were no vitals taken for this visit..  This was a telehealth visit.     DIAGNOSTIC STUDIES:                        MRI 01/10/2022:    Right kidney: Multiple simple cysts. No suspicious lesion.  Left kidney: The T2-isointense to hypointense, hypoenhancing, partially exophytic (less than 50%) mass with associated restricted diffusion in the posterior aspect of the left interpolar kidney measures 2.0 x 1.3 x 1.7 cm (series 1004/#61, series 1201/#124), previously 1.7 x 1.7 x 1.9 cm. The lesion is approximately 13 mm from the collecting system and is situated entirely between the polar lines of the kidney. Additional simple renal cysts.  Renal vasculature: Single bilateral renal arteries and no renal venous tumor invasion.    Visualized ureters: Normal.        Renal Mass Biopsy 11/05/2020:  Final Diagnosis   A) Left renal mass, biopsy:                - Chromophobe  renal cell carcinoma.          ASSESSMENT AND PLAN:    -Chronic Kidney Disease Stage 3A  Review of available labs since at least 2015 show a creatinine ranging from 1.27-1.61 (and one value obtained through [facility] from 04/28/1996 of 1.4mg /dL).  This indicates long-standing element of chronic kidney disease with eGFR likely in the ~ 50 mL/minute range albeit a more or less stable course over this period of time with minimal progression.  Last serum creatinine stable at 1.39mg /dL with eGFR 16XW/RUEAVW, that indicates overall stable status without noticeable progression.      Albuminuria continues with variable severity 387mg /g- 732mg /g but overall seems to have considerably increased on this last check to 2140mg /g (management: see below).  Continues on losartan with empagliflozin.  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  Ultrasound of the kidneys is instructive in this regard indicating increased echogenicity from interstitial fibrosis.  Previously, a monoclonal disorder was ruled out with urine and serum electrophoresis studies as well as free light chain assays.      Needs continued aggressive risk factor modification (see below).    -Hypertension, Primary  Unfortunately not checking home readings frequently.  Given overall stable previous clinic readings, continue on current regimen of nifedipine XL 60 mg twice daily, metoprolol extended release 50 mg daily and losartan 50 mg daily.  I have instructed him to start checking his blood pressures regularly at home for additional antihypertensive modification.       -Diabetes mellitus  type 2, uncontrolled  After course of relatively stable control of diabetes mellitus, was hospitalized in early February 2021 with hyperglycemic hyperosmolar syndrome requiring initiation of insulin.  Since that time he has also been  started on empagliflozin.  Last hemoglobin A1c now considerably improved down to 7.6% from a high of 13%.     Continue losartan and empagliflozin for maximum anti-proteinuric effect.    Particularly in the context of significantly worsened albuminuria, if his blood pressure allows will like to increase losartan to 100 mg daily.  Similarly, will follow the trend for the next few months and on next follow-up if proteinuria is persistently high to any worse, will proceed with approval for Finerinone.       -Renal cell carcinoma   Imaging studies showed a 2 cm lesion in the interpolar region of the left kidney. On the basis of MRI 09/14/2020 there appeared to be slight progression to 2.3 cm lesion.    In consultation with Dr. 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 He has opted for close surveillance with MRI scans. Last MRI and urology visit was July 2023 at which time no significant change in the size of the mass was seen and plan was to continue with ongoing surveillance with imaging.            Return to clinic in 6 months with CBC, renal function panel, urinalysis with albumin to creatinine ratio, iPTH.      Alesia Richards, MD, 11/23/2022  Division of Nephrology  [facility]  [place], [place]

## 2022-11-25 IMAGING — MR MR HEAD W/O CM
14 of 19 series · 31 of 48 positions shown · non-contrast
Comparison: None.

CLINICAL DATA: Dizziness, non-specific

EXAM:
MRI HEAD WITHOUT CONTRAST
TECHNIQUE: Multiplanar, multiecho pulse sequences of the brain and surrounding
structures were obtained without intravenous contrast.

[Series 5: DWI · axial · 3.0mm · 0.77mm/px · z∈[-50,+94]mm · 2 of 49 slices shown (1 of 6)]
[im 1/49]
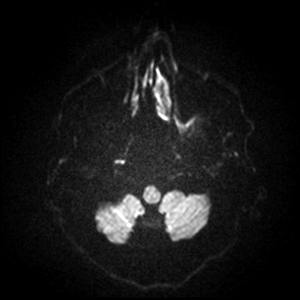
[im 49/49]
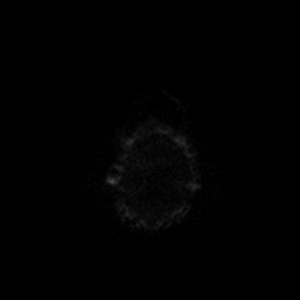

[Series 5: DWI · axial · 3.0mm · 0.77mm/px · z∈[-50,+94]mm · 2 of 48 slices shown (2 of 6)]
[im 1/48]
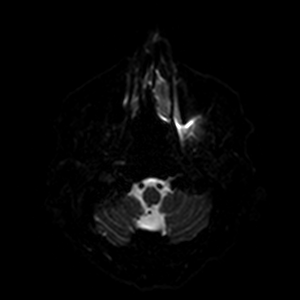
[im 48/48]
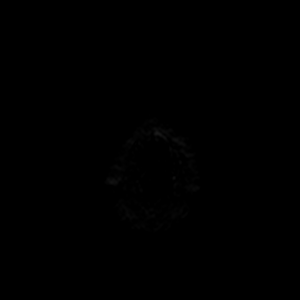

[Series 6: DWI · axial · 3.0mm · 0.77mm/px · z∈[-50,+94]mm · 2 of 48 slices shown (3 of 6)]
[im 1/48]
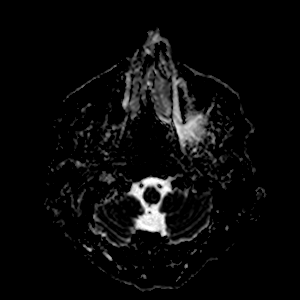
[im 48/48]
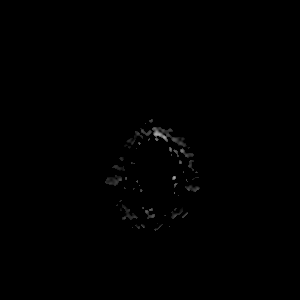

[Series 7: DWI · coronal · 5.0mm · 0.88mm/px · 2 of 29 slices shown (4 of 6)]
[im 1/29]
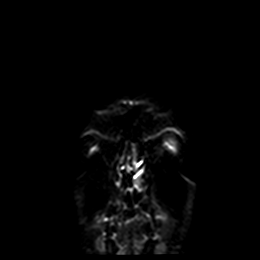
[im 29/29]
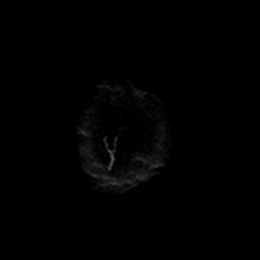

[Series 7: DWI · coronal · 5.0mm · 0.88mm/px · 2 of 29 slices shown (5 of 6)]
[im 1/29]
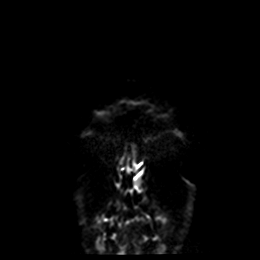
[im 29/29]
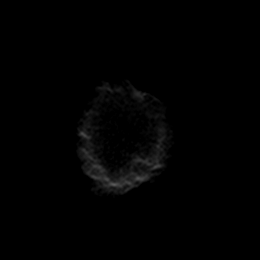

[Series 8: DWI · coronal · 5.0mm · 0.88mm/px · 2 of 29 slices shown (6 of 6)]
[im 1/29]
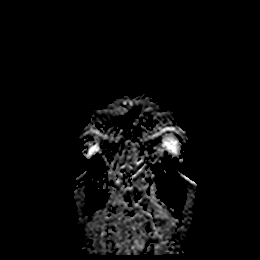
[im 29/29]
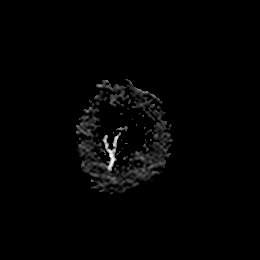

[Series 9: T1 · sagittal · 5.0mm · 0.78mm/px · 1 of 23 slices shown]
[im 1/23]
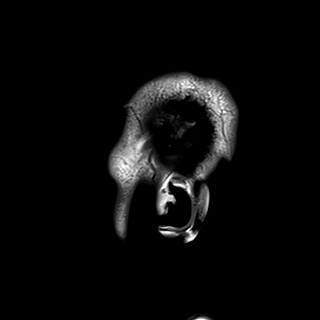

[Series 10: T2 · axial · 5.0mm · 0.72mm/px · 1 of 23 slices shown (1 of 2)]
[im 1/23]
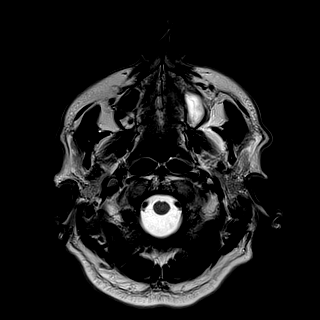

[Series 11: mag_images · axial · 3.0mm · 0.90mm/px · z∈[-54,+98]mm · 3 of 52 slices shown]
[im 1/52]
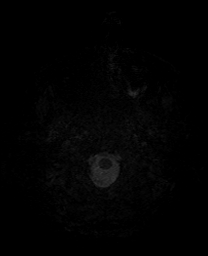
[im 26/52]
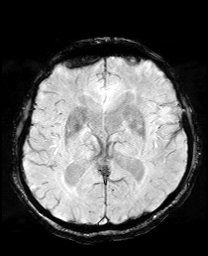
[im 52/52]
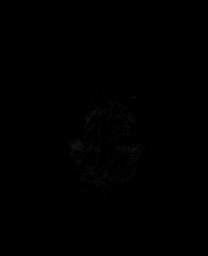

[Series 12: pha_images · axial · 3.0mm · 0.90mm/px · z∈[-54,+92]mm · 3 of 50 slices shown]
[im 1/50]
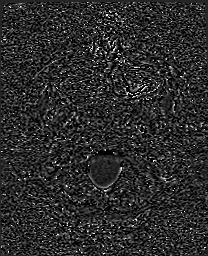
[im 25/50]
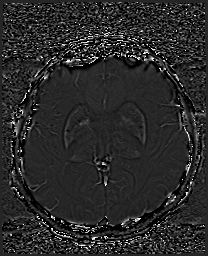
[im 50/50]
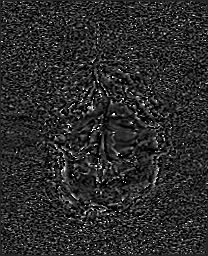

[Series 13: swi_images · axial · 3.0mm · 0.90mm/px · z∈[-54,+98]mm · 3 of 52 slices shown]
[im 1/52]
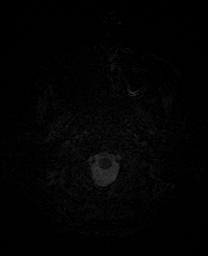
[im 26/52]
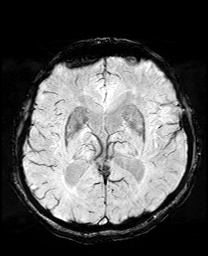
[im 52/52]
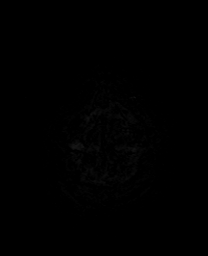

[Series 14: mip_images(sw) · axial · 24.0mm · 0.90mm/px · z∈[-43,+88]mm · 3 of 45 slices shown]
[im 1/45]
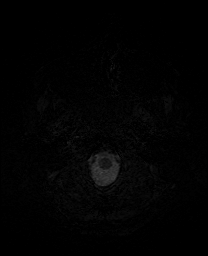
[im 23/45]
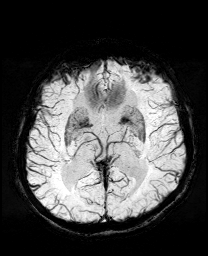
[im 45/45]
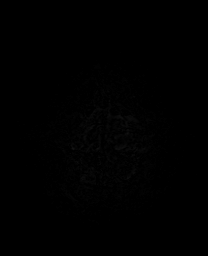

[Series 15: FLAIR · axial · 3.0mm · 0.45mm/px · z∈[-47,+99]mm · 3 of 50 slices shown]
[im 1/50]
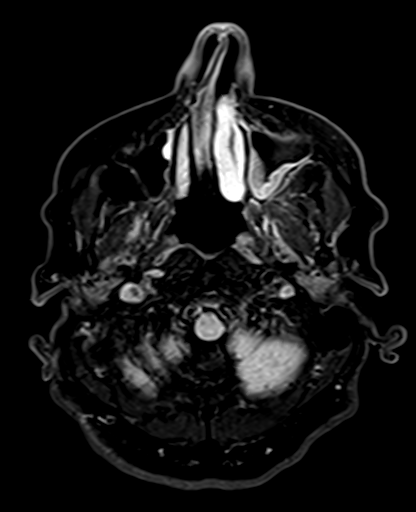
[im 25/50]
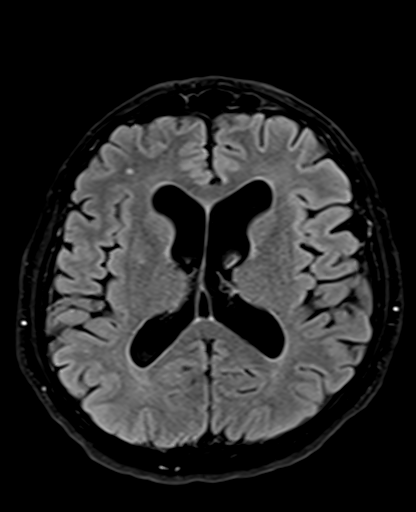
[im 50/50]
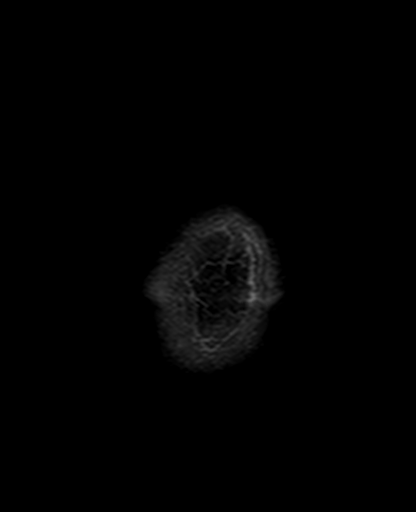

[Series 17: T2 · coronal · 5.0mm · 0.72mm/px · 2 of 28 slices shown (2 of 2)]
[im 1/28]
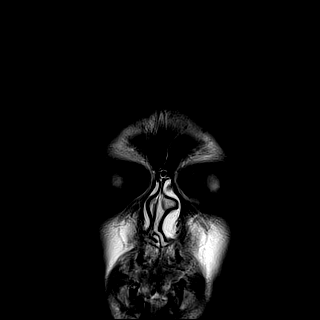
[im 28/28]
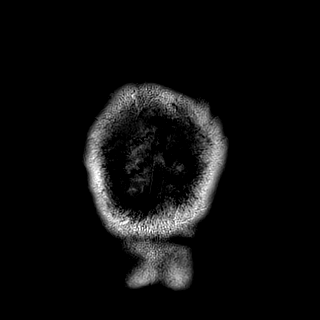

[31 of 48 positions shown; findings below may reference images not displayed]

FINDINGS: Brain: No acute infarction, hemorrhage, extra-axial collection or
mass lesion. Generalized atrophy with moderate ventriculomegaly,
probably ex vacuo. The callosal angle is not acute and there is no
crowding of sulci at the vertex. The cerebral aqueduct is patent on
sagittal imaging. Heterogeneous FLAIR hyperintensity in the third
ventricle is favored to reflect pulsation artifact given
unremarkable appearance on other sequences. Mild for age scattered
T2 hyperintensities in the matter, nonspecific but compatible with
chronic microvascular ischemic disease.

Vascular: Major arterial flow voids are maintained at the skull
base.

Skull and upper cervical spine: Normal marrow signal.

Sinuses/Orbits: Mild to moderate mucosal thickening the left
maxillary sinus, left sphenoid sinus and scattered ethmoid air
cells.

Other: No mastoid effusions.
IMPRESSION: 1. No evidence of acute abnormality.
2. Generalized atrophy with moderate ventriculomegaly, probably ex
vacuo.
3. Mild chronic microvascular disease.
4. Mild-to-moderate paranasal sinus mucosal thickening.

## 2023-02-06 ENCOUNTER — Emergency Department: Payer: Self-pay

## 2023-02-06 ENCOUNTER — Other Ambulatory Visit: Payer: Self-pay

## 2023-02-06 ENCOUNTER — Emergency Department
Admission: EM | Admit: 2023-02-06 | Discharge: 2023-02-06 | Disposition: A | Discharge status: Home / Self Care | Payer: No Typology Code available for payment source | Source: Home / Self Care | Attending: Emergency Medicine | Admitting: Emergency Medicine

## 2023-02-06 DIAGNOSIS — N189 Chronic kidney disease, unspecified: Secondary | ICD-10-CM | POA: Insufficient documentation

## 2023-02-06 DIAGNOSIS — L03213 Periorbital cellulitis: Secondary | ICD-10-CM | POA: Insufficient documentation

## 2023-02-06 DIAGNOSIS — E785 Hyperlipidemia, unspecified: Secondary | ICD-10-CM | POA: Insufficient documentation

## 2023-02-06 DIAGNOSIS — I129 Hypertensive chronic kidney disease with stage 1 through stage 4 chronic kidney disease, or unspecified chronic kidney disease: Secondary | ICD-10-CM | POA: Insufficient documentation

## 2023-02-06 DIAGNOSIS — Z7985 Long-term (current) use of injectable non-insulin antidiabetic drugs: Secondary | ICD-10-CM | POA: Insufficient documentation

## 2023-02-06 DIAGNOSIS — E1122 Type 2 diabetes mellitus with diabetic chronic kidney disease: Secondary | ICD-10-CM | POA: Insufficient documentation

## 2023-02-06 DIAGNOSIS — W208XXA Other cause of strike by thrown, projected or falling object, initial encounter: Secondary | ICD-10-CM | POA: Insufficient documentation

## 2023-02-06 DIAGNOSIS — Z7984 Long term (current) use of oral hypoglycemic drugs: Secondary | ICD-10-CM | POA: Diagnosis not present

## 2023-02-06 MED ORDER — FLUORESCEIN SODIUM 1 MG OP STRP
1.0000 | ORAL_STRIP | Freq: Once | OPHTHALMIC | Status: AC
Start: 2023-02-06 — End: 2023-02-06
  Administered 2023-02-06: 1 via OPHTHALMIC
  Filled 2023-02-06: qty 1

## 2023-02-06 MED ORDER — ERYTHROMYCIN 5 MG/GM OP OINT
TOPICAL_OINTMENT | Freq: Four times a day (QID) | OPHTHALMIC | 0 refills | Status: AC
Start: 2023-02-06 — End: 2023-02-13

## 2023-02-06 MED ORDER — CEPHALEXIN 500 MG OR CAPS
500.0000 mg | ORAL_CAPSULE | Freq: Four times a day (QID) | ORAL | 0 refills | Status: AC
Start: 2023-02-06 — End: 2023-02-13

## 2023-02-06 NOTE — Discharge Instructions (Addendum)
Follow-up with your regular eye doctor on Monday to see how you are doing.  Return to the emergency department if symptoms worsen, change, new symptoms develop, or for any other concerns.     Apply warm compress to the left eye, 15 minutes at a time, 3-4 times a day.

## 2023-02-06 NOTE — ED Triage Notes (Signed)
Sean Oliver is a 62 year old male who presents to the emergency department for bilateral eye swelling.    The patient states that he developed eye swelling yesterday. It was mild yesterday but has become worse today.     He denies redness or itching, denies nausea or vomiting.
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
## 2023-03-13 ENCOUNTER — Other Ambulatory Visit (INDEPENDENT_AMBULATORY_CARE_PROVIDER_SITE_OTHER): Payer: Self-pay | Admitting: Family Medicine

## 2023-03-13 DIAGNOSIS — E1122 Type 2 diabetes mellitus with diabetic chronic kidney disease: Secondary | ICD-10-CM

## 2023-03-13 DIAGNOSIS — I10 Essential (primary) hypertension: Secondary | ICD-10-CM

## 2023-03-15 ENCOUNTER — Other Ambulatory Visit (INDEPENDENT_AMBULATORY_CARE_PROVIDER_SITE_OTHER): Payer: Self-pay | Admitting: Family Medicine

## 2023-03-15 DIAGNOSIS — I10 Essential (primary) hypertension: Secondary | ICD-10-CM

## 2023-03-15 DIAGNOSIS — E785 Hyperlipidemia, unspecified: Secondary | ICD-10-CM

## 2023-03-15 MED ORDER — METFORMIN HCL 1000 MG OR TABS
1000.0000 mg | ORAL_TABLET | Freq: Two times a day (BID) | ORAL | 0 refills | Status: DC
Start: 2023-03-15 — End: 2023-06-15

## 2023-03-15 MED ORDER — METOPROLOL SUCCINATE ER 25 MG OR TB24
50.0000 mg | EXTENDED_RELEASE_TABLET | Freq: Every day | ORAL | 0 refills | Status: DC
Start: 2023-03-15 — End: 2023-06-15

## 2023-03-15 MED ORDER — JARDIANCE 25 MG OR TABS
ORAL_TABLET | ORAL | 0 refills | Status: DC
Start: 2023-03-15 — End: 2023-06-15

## 2023-03-15 NOTE — Telephone Encounter (Signed)
Patient is due for yearly exam.  One refill authorized.  Please schedule follow up visit.

## 2023-03-17 MED ORDER — ATORVASTATIN CALCIUM 40 MG OR TABS
40.0000 mg | ORAL_TABLET | Freq: Every day | ORAL | 0 refills | Status: DC
Start: 2023-03-17 — End: 2023-05-25

## 2023-03-17 MED ORDER — LOSARTAN POTASSIUM 50 MG OR TABS
ORAL_TABLET | ORAL | 0 refills | Status: DC
Start: 2023-03-17 — End: 2023-06-15

## 2023-03-17 NOTE — Telephone Encounter (Signed)
Patient is due for yearly exam.  One refill authorized.  Please schedule follow up visit.

## 2023-03-17 NOTE — Telephone Encounter (Signed)
LMTCB 1st attempt

## 2023-03-18 NOTE — Telephone Encounter (Signed)
LVMTCB    CCR- Please help patient schedule appointment, if unable to help please warm transfer to front desk.    2nd attempt.    No further action needed, closing TE.

## 2023-03-19 NOTE — Telephone Encounter (Signed)
LMTCB 1st attempt    CCR- please assist in scheduling appointment for further refills

## 2023-03-22 NOTE — Telephone Encounter (Signed)
LMTCB 2nd attempt    CCR- please assist in scheduling appointment for further refills

## 2023-03-27 ENCOUNTER — Other Ambulatory Visit (INDEPENDENT_AMBULATORY_CARE_PROVIDER_SITE_OTHER): Payer: Self-pay | Admitting: Family Medicine

## 2023-03-27 DIAGNOSIS — I10 Essential (primary) hypertension: Secondary | ICD-10-CM

## 2023-03-30 MED ORDER — NIFEDIPINE ER 30 MG OR TB24
60.0000 mg | EXTENDED_RELEASE_TABLET | Freq: Two times a day (BID) | ORAL | 0 refills | Status: DC
Start: 2023-03-30 — End: 2023-04-06

## 2023-03-30 NOTE — Telephone Encounter (Signed)
03/15/23 APPT reminder sent.

## 2023-04-03 ENCOUNTER — Other Ambulatory Visit (INDEPENDENT_AMBULATORY_CARE_PROVIDER_SITE_OTHER): Payer: Self-pay | Admitting: Family Medicine

## 2023-04-04 ENCOUNTER — Encounter (INDEPENDENT_AMBULATORY_CARE_PROVIDER_SITE_OTHER): Payer: Self-pay | Admitting: Family Medicine

## 2023-04-06 MED ORDER — NIFEDIPINE ER 30 MG OR TB24
60.0000 mg | EXTENDED_RELEASE_TABLET | Freq: Two times a day (BID) | ORAL | 0 refills | Status: DC
Start: 2023-04-06 — End: 2023-06-29

## 2023-04-06 NOTE — Telephone Encounter (Signed)
Patient notified via MyChart.    Closing TE.

## 2023-05-24 ENCOUNTER — Other Ambulatory Visit (INDEPENDENT_AMBULATORY_CARE_PROVIDER_SITE_OTHER): Payer: Self-pay | Admitting: Family Medicine

## 2023-05-24 DIAGNOSIS — E785 Hyperlipidemia, unspecified: Secondary | ICD-10-CM

## 2023-05-25 MED ORDER — ATORVASTATIN CALCIUM 40 MG OR TABS
40.0000 mg | ORAL_TABLET | Freq: Every day | ORAL | 0 refills | Status: DC
Start: 2023-05-25 — End: 2023-08-04

## 2023-05-25 NOTE — Telephone Encounter (Signed)
Patient is due for yearly exam.  One refill authorized.  Please schedule follow up visit.

## 2023-05-28 NOTE — Telephone Encounter (Signed)
LMTCB 1st attempt    CCR- please assist in scheduling appointment for further refills

## 2023-05-31 NOTE — Telephone Encounter (Signed)
LVMTCB    CCR- Please help patient schedule appointment, if unable to help please warm transfer to front desk.    2nd attempt.

## 2023-06-02 NOTE — Telephone Encounter (Signed)
..  LVMTCB    CCR- Please help patient schedule appointment, if unable to help please warm transfer to front desk.    3rd attempt.

## 2023-06-12 ENCOUNTER — Other Ambulatory Visit (INDEPENDENT_AMBULATORY_CARE_PROVIDER_SITE_OTHER): Payer: Self-pay | Admitting: Family Medicine

## 2023-06-12 DIAGNOSIS — Z794 Long term (current) use of insulin: Secondary | ICD-10-CM

## 2023-06-13 ENCOUNTER — Other Ambulatory Visit (INDEPENDENT_AMBULATORY_CARE_PROVIDER_SITE_OTHER): Payer: Self-pay | Admitting: Family Medicine

## 2023-06-13 DIAGNOSIS — I10 Essential (primary) hypertension: Secondary | ICD-10-CM

## 2023-06-15 MED ORDER — LOSARTAN POTASSIUM 50 MG OR TABS
ORAL_TABLET | ORAL | 0 refills | Status: DC
Start: 2023-06-15 — End: 2023-06-22

## 2023-06-15 MED ORDER — METFORMIN HCL 1000 MG OR TABS
1000.0000 mg | ORAL_TABLET | Freq: Two times a day (BID) | ORAL | 0 refills | Status: DC
Start: 2023-06-15 — End: 2023-06-22

## 2023-06-15 MED ORDER — METOPROLOL SUCCINATE ER 25 MG OR TB24
EXTENDED_RELEASE_TABLET | ORAL | 0 refills | Status: DC
Start: 2023-06-15 — End: 2023-06-22

## 2023-06-15 MED ORDER — JARDIANCE 25 MG OR TABS
ORAL_TABLET | ORAL | 0 refills | Status: DC
Start: 2023-06-15 — End: 2023-06-22

## 2023-06-15 NOTE — Telephone Encounter (Signed)
Visit request already sent in another encounter.

## 2023-06-15 NOTE — Telephone Encounter (Signed)
Appt request already sent to front desk on previous encounter

## 2023-06-19 ENCOUNTER — Other Ambulatory Visit (INDEPENDENT_AMBULATORY_CARE_PROVIDER_SITE_OTHER): Payer: Self-pay | Admitting: Family Medicine

## 2023-06-19 DIAGNOSIS — E1122 Type 2 diabetes mellitus with diabetic chronic kidney disease: Secondary | ICD-10-CM

## 2023-06-19 DIAGNOSIS — I10 Essential (primary) hypertension: Secondary | ICD-10-CM

## 2023-06-22 MED ORDER — METFORMIN HCL 1000 MG OR TABS
1000.0000 mg | ORAL_TABLET | Freq: Two times a day (BID) | ORAL | 0 refills | Status: DC
Start: 2023-06-22 — End: 2023-06-24

## 2023-06-22 MED ORDER — METOPROLOL SUCCINATE ER 25 MG OR TB24
EXTENDED_RELEASE_TABLET | ORAL | 0 refills | Status: DC
Start: 2023-06-22 — End: 2023-06-24

## 2023-06-22 MED ORDER — LOSARTAN POTASSIUM 50 MG OR TABS
ORAL_TABLET | ORAL | 0 refills | Status: DC
Start: 2023-06-22 — End: 2023-06-24

## 2023-06-22 MED ORDER — JARDIANCE 25 MG OR TABS
ORAL_TABLET | ORAL | 0 refills | Status: DC
Start: 2023-06-22 — End: 2023-06-24

## 2023-06-23 ENCOUNTER — Encounter (INDEPENDENT_AMBULATORY_CARE_PROVIDER_SITE_OTHER): Payer: Self-pay | Admitting: Family Medicine

## 2023-06-23 ENCOUNTER — Other Ambulatory Visit (INDEPENDENT_AMBULATORY_CARE_PROVIDER_SITE_OTHER): Payer: Self-pay | Admitting: Family Medicine

## 2023-06-23 DIAGNOSIS — N1832 Chronic kidney disease, stage 3b: Secondary | ICD-10-CM

## 2023-06-23 DIAGNOSIS — I10 Essential (primary) hypertension: Secondary | ICD-10-CM

## 2023-06-24 MED ORDER — LOSARTAN POTASSIUM 50 MG OR TABS
ORAL_TABLET | ORAL | 0 refills | Status: DC
Start: 2023-06-24 — End: 2023-08-04

## 2023-06-24 MED ORDER — METOPROLOL SUCCINATE ER 25 MG OR TB24
EXTENDED_RELEASE_TABLET | ORAL | 0 refills | Status: DC
Start: 2023-06-24 — End: 2023-08-04

## 2023-06-24 MED ORDER — METFORMIN HCL 1000 MG OR TABS
1000.0000 mg | ORAL_TABLET | Freq: Two times a day (BID) | ORAL | 0 refills | Status: DC
Start: 2023-06-24 — End: 2023-08-04

## 2023-06-24 MED ORDER — EMPAGLIFLOZIN 25 MG OR TABS
25.0000 mg | ORAL_TABLET | Freq: Every day | ORAL | 0 refills | Status: DC
Start: 2023-06-24 — End: 2023-08-04

## 2023-06-24 NOTE — Telephone Encounter (Signed)
Routing to provider to please advise. -Thanks!     It shows received    E-Prescribing Status: Receipt confirmed by pharmacy (06/22/2023 11:39 AM PST)          Pharmacy stating they did not receive

## 2023-06-26 ENCOUNTER — Other Ambulatory Visit (INDEPENDENT_AMBULATORY_CARE_PROVIDER_SITE_OTHER): Payer: Self-pay | Admitting: Family Medicine

## 2023-06-26 DIAGNOSIS — I10 Essential (primary) hypertension: Secondary | ICD-10-CM

## 2023-06-29 MED ORDER — NIFEDIPINE ER 30 MG OR TB24
60.0000 mg | EXTENDED_RELEASE_TABLET | Freq: Two times a day (BID) | ORAL | 0 refills | Status: DC
Start: 2023-06-29 — End: 2023-07-28

## 2023-06-29 NOTE — Telephone Encounter (Signed)
Patient is due for yearly exam.  One refill authorized.  Please schedule follow up visit.

## 2023-07-01 NOTE — Telephone Encounter (Signed)
LMTCB 1st attempt    CCR- please assist in scheduling appointment for further refills

## 2023-07-02 NOTE — Telephone Encounter (Signed)
LMTCB 2nd attempt    CCR- please assist in scheduling appointment for further refills

## 2023-07-27 ENCOUNTER — Other Ambulatory Visit (INDEPENDENT_AMBULATORY_CARE_PROVIDER_SITE_OTHER): Payer: Self-pay | Admitting: Family Medicine

## 2023-07-27 DIAGNOSIS — I10 Essential (primary) hypertension: Secondary | ICD-10-CM

## 2023-07-28 MED ORDER — NIFEDIPINE ER 30 MG OR TB24
60.0000 mg | EXTENDED_RELEASE_TABLET | Freq: Two times a day (BID) | ORAL | 0 refills | Status: DC
Start: 2023-07-28 — End: 2023-08-04

## 2023-08-04 ENCOUNTER — Encounter (INDEPENDENT_AMBULATORY_CARE_PROVIDER_SITE_OTHER): Payer: Self-pay

## 2023-08-04 ENCOUNTER — Ambulatory Visit (INDEPENDENT_AMBULATORY_CARE_PROVIDER_SITE_OTHER): Payer: No Typology Code available for payment source

## 2023-08-04 VITALS — BP 161/101 | HR 57 | Temp 98.7°F | Resp 20 | Ht 74.0 in | Wt 205.0 lb

## 2023-08-04 DIAGNOSIS — I129 Hypertensive chronic kidney disease with stage 1 through stage 4 chronic kidney disease, or unspecified chronic kidney disease: Secondary | ICD-10-CM

## 2023-08-04 DIAGNOSIS — Z794 Long term (current) use of insulin: Secondary | ICD-10-CM

## 2023-08-04 DIAGNOSIS — E1122 Type 2 diabetes mellitus with diabetic chronic kidney disease: Secondary | ICD-10-CM

## 2023-08-04 DIAGNOSIS — J302 Other seasonal allergic rhinitis: Secondary | ICD-10-CM

## 2023-08-04 DIAGNOSIS — R059 Cough, unspecified: Secondary | ICD-10-CM | POA: Insufficient documentation

## 2023-08-04 DIAGNOSIS — Z131 Encounter for screening for diabetes mellitus: Secondary | ICD-10-CM

## 2023-08-04 DIAGNOSIS — N529 Male erectile dysfunction, unspecified: Secondary | ICD-10-CM

## 2023-08-04 DIAGNOSIS — R062 Wheezing: Secondary | ICD-10-CM

## 2023-08-04 DIAGNOSIS — N1832 Chronic kidney disease, stage 3b: Secondary | ICD-10-CM

## 2023-08-04 DIAGNOSIS — I10 Essential (primary) hypertension: Secondary | ICD-10-CM

## 2023-08-04 DIAGNOSIS — Z0001 Encounter for general adult medical examination with abnormal findings: Secondary | ICD-10-CM

## 2023-08-04 DIAGNOSIS — E785 Hyperlipidemia, unspecified: Secondary | ICD-10-CM

## 2023-08-04 DIAGNOSIS — Z13220 Encounter for screening for lipoid disorders: Secondary | ICD-10-CM

## 2023-08-04 LAB — LIPID PANEL
Cholesterol/HDL Ratio: 3.4
HDL Cholesterol: 29 mg/dL — ABNORMAL LOW (ref >39)
LDL Cholesterol, NIH Equation: 40 mg/dL (ref <130)
Non-HDL Cholesterol: 71 mg/dL (ref 0–159)
Total Cholesterol: 100 mg/dL (ref <200)
Triglyceride: 191 mg/dL — ABNORMAL HIGH (ref <150)

## 2023-08-04 LAB — CBC, DIFF
% Basophils: 1 %
% Eosinophils: 4 %
% Immature Granulocytes: 0 %
% Lymphocytes: 23 %
% Monocytes: 6 %
% Neutrophils: 66 %
% Nucleated RBC: 0 %
Absolute Eosinophil Count: 0.34 10*3/uL (ref 0.00–0.50)
Absolute Lymphocyte Count: 2.16 10*3/uL (ref 1.00–4.80)
Basophils: 0.05 10*3/uL (ref 0.00–0.20)
Hematocrit: 48 % (ref 38.0–50.0)
Hemoglobin: 15.2 g/dL (ref 13.0–18.0)
Immature Granulocytes: 0.03 10*3/uL (ref 0.00–0.05)
MCH: 28.1 pg (ref 27.3–33.6)
MCHC: 31.9 g/dL — ABNORMAL LOW (ref 32.2–36.5)
MCV: 88 fL (ref 81–98)
Monocytes: 0.56 10*3/uL (ref 0.00–0.80)
Neutrophils: 6.27 10*3/uL (ref 1.80–7.00)
Nucleated RBC: 0 10*3/uL
Platelet Count: 329 10*3/uL (ref 150–400)
RBC: 5.41 10*6/uL (ref 4.40–5.60)
RDW-CV: 15.2 % — ABNORMAL HIGH (ref 11.0–14.5)
WBC: 9.41 10*3/uL (ref 4.3–10.0)

## 2023-08-04 LAB — COMPREHENSIVE METABOLIC PANEL
ALT (GPT): 13 U/L (ref 10–48)
AST (GOT): 12 U/L (ref 9–38)
Albumin: 4.1 g/dL (ref 3.5–5.2)
Alkaline Phosphatase (Total): 68 U/L (ref 37–159)
Anion Gap: 11 (ref 4–12)
Bilirubin (Total): 0.3 mg/dL (ref 0.2–1.3)
Calcium: 9.8 mg/dL (ref 8.9–10.2)
Carbon Dioxide, Total: 26 meq/L (ref 22–32)
Chloride: 103 meq/L (ref 98–108)
Creatinine: 1.72 mg/dL — ABNORMAL HIGH (ref 0.51–1.18)
Glucose: 194 mg/dL — ABNORMAL HIGH (ref 62–125)
Potassium: 4.4 meq/L (ref 3.6–5.2)
Protein (Total): 6.8 g/dL (ref 6.0–8.2)
Sodium: 140 meq/L (ref 135–145)
Urea Nitrogen: 24 mg/dL — ABNORMAL HIGH (ref 8–21)
eGFR by CKD-EPI 2021: 45 mL/min/{1.73_m2} — ABNORMAL LOW (ref >59)

## 2023-08-04 LAB — PARATHYROID HORMONE: Parathyroid Hormone: 36 pg/mL (ref 12–88)

## 2023-08-04 MED ORDER — EMPAGLIFLOZIN 25 MG OR TABS
25.0000 mg | ORAL_TABLET | Freq: Every day | ORAL | 0 refills | Status: DC
Start: 2023-08-04 — End: 2023-11-26

## 2023-08-04 MED ORDER — NIFEDIPINE ER 30 MG OR TB24
60.0000 mg | EXTENDED_RELEASE_TABLET | Freq: Two times a day (BID) | ORAL | 0 refills | Status: DC
Start: 2023-08-04 — End: 2023-10-06

## 2023-08-04 MED ORDER — LOSARTAN POTASSIUM 50 MG OR TABS
ORAL_TABLET | ORAL | 0 refills | Status: DC
Start: 2023-08-04 — End: 2023-11-26

## 2023-08-04 MED ORDER — METFORMIN HCL 1000 MG OR TABS
1000.0000 mg | ORAL_TABLET | Freq: Two times a day (BID) | ORAL | 0 refills | Status: DC
Start: 2023-08-04 — End: 2023-11-26

## 2023-08-04 MED ORDER — METOPROLOL SUCCINATE ER 25 MG OR TB24
EXTENDED_RELEASE_TABLET | ORAL | 0 refills | Status: DC
Start: 2023-08-04 — End: 2023-11-26

## 2023-08-04 MED ORDER — ATORVASTATIN CALCIUM 40 MG OR TABS
40.0000 mg | ORAL_TABLET | Freq: Every day | ORAL | 0 refills | Status: DC
Start: 2023-08-04 — End: 2023-11-03

## 2023-08-04 MED ORDER — ALBUTEROL SULFATE HFA 108 (90 BASE) MCG/ACT IN AERS: 2.0000 | INHALATION_SPRAY | RESPIRATORY_TRACT | 5 refills | Status: DC | PRN

## 2023-08-04 MED ORDER — SILDENAFIL CITRATE 100 MG OR TABS: ORAL_TABLET | ORAL | 2 refills | Status: DC

## 2023-08-04 NOTE — Progress Notes (Signed)
 I have personally discussed the case with the resident during or immediately after the patient visit including review of history, physical exam, diagnosis, and treatment plan. I agree with the assessment and plan of care.

## 2023-08-04 NOTE — Progress Notes (Signed)
Rooming was completed 08/04/2023 by Sheran Lawless, CMA    This visit was completed : In Person    Was an Engineer, technical sales Used? No    Reason for Visit:   Chief Complaint   Patient presents with    Wellness         Vaccines Due?  Yes - Not completing today    Health Maintenance Due   Topic    Prostate Cancer Screening Education     Diabetes Eye Exam     Zoster Vaccine (1 of 2)    Pneumococcal Vaccine: 50+ Years (2 of 2 - PCV)    Depression Screening (PHQ-2)     DTaP, Tdap and Td Vaccines (4 - Td or Tdap)    Diabetes A1c     Skin Cancer Monitoring for High Risk Patients     Diabetes Foot Exam     Colorectal Cancer Screening        PCP Verified?  Yes, No primary care provider on file.

## 2023-08-04 NOTE — Progress Notes (Signed)
Chief Complaint   Patient presents with    Wellness          Subjective:     Sean Oliver is a 63 year old male w/ PMH CKD3, DM2, and HTN who presents on 08/04/2023 for medication refills and annual wellness visit. He is unsure what meds he needs, reports he just needs "all of them". When prompted about whether he would be interested in establishing a PCP, he denied and said that he "has done this before". Declined second BP check. He is wondering why he has to keep getting labs and was frustrated that he was not told to get labs done before the visit today. He was informed that we would've wanted to see him regardless of when he would get labs done.    HTN  - does not check BP at home  - reports he is compliant with meds  - does not want to increase any of his BP meds    DM  - takes BS at home 120s, checks it every other day  - last A1c 7.6 03/11/22    Needs CRC screen and Prostate Cancer Screening  - would like the kit for the FOBT/FIT test    Already got flu and COVID at [facility]    Also gets care at the [facility], likely for his CKD.    Reports he got a DM retinal exam and signed papers for ROI.       Objective:    Vitals: BP (!) 161/101   Pulse (!) 57   Temp 37.1 C (Temporal)   Resp 20   Ht 6\' 2"  (1.88 m)   Wt 93 kg (205 lb)   SpO2 99%   BMI 26.32 kg/m   Physical Exam  Constitutional:       General: He is not in acute distress.     Appearance: Normal appearance.   HENT:      Head: Normocephalic and atraumatic.   Cardiovascular:      Rate and Rhythm: Normal rate and regular rhythm.      Heart sounds: No murmur heard.  Pulmonary:      Effort: Pulmonary effort is normal.      Breath sounds: Normal breath sounds.   Neurological:      General: No focal deficit present.      Mental Status: He is alert and oriented to person, place, and time.   Psychiatric:         Attention and Perception: Attention normal.         Behavior: Behavior is agitated.            Assessment and Plan:      [name] is a 63 year  old male w/ PMH CKD3, DM2, and HTN who presents on 08/04/2023 for medication refills and annual wellness visit.    Type 2 diabetes mellitus with stage 3b chronic kidney disease, with long-term current use of insulin (HCC)  Last A1c 7.9% in 02/2022. Reports he is compliant with his empagliflozin and metformin. Will recheck labs today since he hasn't had them checked in over a year. Home BG usually 120s, checks every other day. Patient reports his retinal eye exam was normal and in-office DM foot exam was also normal today.  -     Comprehensive Metabolic Panel; Future  -     CBC with Diff; Future  -     Parathyroid Hormone; Future  -     empagliflozin (Jardiance) 25 MG  tablet; Take 1 tablet (25 mg) by mouth daily.  Dispense: 90 tablet; Refill: 0  -     metFORMIN 1000 MG tablet; Take 1 tablet (1,000 mg) by mouth 2 times a day with meals.  Dispense: 180 tablet; Refill: 0    Essential hypertension  BP elevated in clinic today at 161/101. Declines second BP check and is not interested in increasing dosages of BP medications. Reports he is compliant with losartan, nifedipine, and metoprolol. Unclear why he's on metoprolol but he was mildly bradycardic today at 57bpm. Will continue refill meds, but may consider increasing losartan and/or nifedipine and stopping metoprolol if bradycardia persists.  -     losartan 50 MG tablet; TAKE 1 TABLET (50 MG) BY MOUTH DAILY. Please schedule an appointment.  Dispense: 90 tablet; Refill: 0  -     metoprolol succinate ER 25 MG 24 hr tablet; TAKE 2 TABLETS BY MOUTH DAILY. DO NOT CRUSH OR CHEW.  Dispense: 180 tablet; Refill: 0  -     NIFEdipine ER 30 MG 24 hr tablet; Take 2 tablets (60 mg) by mouth 2 times a day.  Dispense: 120 tablet; Refill: 0    Screening for diabetes mellitus  Will recheck A1c, as noted above.  -     Hemoglobin A1C, HPLC; Future  -     Comprehensive Metabolic Panel; Future  -     CBC with Diff; Future    Hyperlipidemia, unspecified hyperlipidemia type  Screening for  cholesterol level  Has not had lipids checked since 02/2022. Reports he is compliant with his atorvastatin. Will recheck lipids today.  -     Lipid Panel; Future  -     atorvastatin 40 MG tablet; Take 1 tablet (40 mg) by mouth daily.  Dispense: 90 tablet; Refill: 0      -----------------------------------------------------------------------------------------------  I agree with the plan above.     Michelle Piper PGY-2

## 2023-08-05 LAB — HEMOGLOBIN A1C, HPLC: Hemoglobin A1C: 7.4 % — ABNORMAL HIGH (ref 4.0–5.6)

## 2023-08-09 NOTE — Progress Notes (Signed)
 Chief Complaint   Patient presents with    Wellness          Subjective:     Sean Oliver is a 63 year old male w/ PMH CKD3, DM2, and HTN who presents on 08/04/2023 for medication refills and annual wellness visit. He is unsure what meds he needs, reports he just needs "all of them". When prompted about whether he would be interested in establishing a PCP, he denied and said that he "has done this before". Declined second BP check. He is wondering why he has to keep getting labs and was frustrated that he was not told to get labs done before the visit today. He was informed that we would've wanted to see him regardless of when he would get labs done.    HTN  - does not check BP at home  - reports he is compliant with meds  - does not want to increase any of his BP meds    DM  - takes BS at home 120s, checks it every other day  - last A1c 7.6 03/11/22    Needs CRC screen and Prostate Cancer Screening  - would like the kit for the FOBT/FIT test    Already got flu and COVID at [facility]    Also gets care at the [facility], likely for his CKD.    Reports he got a DM retinal exam and signed papers for ROI.       Objective:    Vitals: BP (!) 161/101   Pulse (!) 57   Temp 37.1 C (Temporal)   Resp 20   Ht 6\' 2"  (1.88 m)   Wt 93 kg (205 lb)   SpO2 99%   BMI 26.32 kg/m   Physical Exam  Constitutional:       General: He is not in acute distress.     Appearance: Normal appearance.   HENT:      Head: Normocephalic and atraumatic.   Cardiovascular:      Rate and Rhythm: Normal rate and regular rhythm.      Heart sounds: No murmur heard.  Pulmonary:      Effort: Pulmonary effort is normal.      Breath sounds: Normal breath sounds.   Neurological:      General: No focal deficit present.      Mental Status: He is alert and oriented to person, place, and time.   Psychiatric:         Attention and Perception: Attention normal.         Behavior: Behavior is agitated.            Assessment and Plan:      Sean Oliver is a 63 year  old male w/ PMH CKD3, DM2, and HTN who presents on 08/04/2023 for medication refills and annual wellness visit.    Type 2 diabetes mellitus with stage 3b chronic kidney disease, with long-term current use of insulin (HCC)  Last A1c 7.9% in 02/2022. Reports he is compliant with his empagliflozin and metformin. Will recheck labs today since he hasn't had them checked in over a year. Home BG usually 120s, checks every other day. Patient reports his retinal eye exam was normal and in-office DM foot exam was also normal today.  -     Comprehensive Metabolic Panel; Future  -     CBC with Diff; Future  -     Parathyroid Hormone; Future  -     empagliflozin (Jardiance) 25 MG  tablet; Take 1 tablet (25 mg) by mouth daily.  Dispense: 90 tablet; Refill: 0  -     metFORMIN 1000 MG tablet; Take 1 tablet (1,000 mg) by mouth 2 times a day with meals.  Dispense: 180 tablet; Refill: 0    Essential hypertension  BP elevated in clinic today at 161/101. Declines second BP check and is not interested in increasing dosages of BP medications. Reports he is compliant with losartan, nifedipine, and metoprolol. Unclear why he's on metoprolol but he was mildly bradycardic today at 57bpm. Will continue refill meds, but may consider increasing losartan and/or nifedipine and stopping metoprolol if bradycardia persists.  -     losartan 50 MG tablet; TAKE 1 TABLET (50 MG) BY MOUTH DAILY. Please schedule an appointment.  Dispense: 90 tablet; Refill: 0  -     metoprolol succinate ER 25 MG 24 hr tablet; TAKE 2 TABLETS BY MOUTH DAILY. DO NOT CRUSH OR CHEW.  Dispense: 180 tablet; Refill: 0  -     NIFEdipine ER 30 MG 24 hr tablet; Take 2 tablets (60 mg) by mouth 2 times a day.  Dispense: 120 tablet; Refill: 0    Screening for diabetes mellitus  Will recheck A1c, as noted above.  -     Hemoglobin A1C, HPLC; Future  -     Comprehensive Metabolic Panel; Future  -     CBC with Diff; Future    Hyperlipidemia, unspecified hyperlipidemia type  Screening for  cholesterol level  Has not had lipids checked since 02/2022. Reports he is compliant with his atorvastatin. Will recheck lipids today.  -     Lipid Panel; Future  -     atorvastatin 40 MG tablet; Take 1 tablet (40 mg) by mouth daily.  Dispense: 90 tablet; Refill: 0      -----------------------------------------------------------------------------------------------  I agree with the plan above.     Michelle Piper PGY-2

## 2023-08-10 NOTE — Progress Notes (Signed)
I did not see the patient but have reviewed the medical student(s)'s documentation.

## 2023-10-02 ENCOUNTER — Other Ambulatory Visit (INDEPENDENT_AMBULATORY_CARE_PROVIDER_SITE_OTHER): Payer: Self-pay

## 2023-10-02 DIAGNOSIS — I10 Essential (primary) hypertension: Secondary | ICD-10-CM

## 2023-10-06 MED ORDER — NIFEDIPINE ER 30 MG OR TB24
60.0000 mg | EXTENDED_RELEASE_TABLET | Freq: Two times a day (BID) | ORAL | 1 refills | Status: DC
Start: 2023-10-06 — End: 2023-10-12

## 2023-10-07 ENCOUNTER — Other Ambulatory Visit (INDEPENDENT_AMBULATORY_CARE_PROVIDER_SITE_OTHER): Payer: Self-pay

## 2023-10-07 DIAGNOSIS — I10 Essential (primary) hypertension: Secondary | ICD-10-CM

## 2023-10-09 ENCOUNTER — Encounter (INDEPENDENT_AMBULATORY_CARE_PROVIDER_SITE_OTHER): Payer: Self-pay

## 2023-10-09 ENCOUNTER — Other Ambulatory Visit (INDEPENDENT_AMBULATORY_CARE_PROVIDER_SITE_OTHER): Payer: Self-pay | Admitting: Family Medicine

## 2023-10-09 ENCOUNTER — Other Ambulatory Visit (INDEPENDENT_AMBULATORY_CARE_PROVIDER_SITE_OTHER): Payer: Self-pay

## 2023-10-09 DIAGNOSIS — I10 Essential (primary) hypertension: Secondary | ICD-10-CM

## 2023-10-12 MED ORDER — NIFEDIPINE ER 30 MG OR TB24
60.0000 mg | EXTENDED_RELEASE_TABLET | Freq: Two times a day (BID) | ORAL | 1 refills | Status: DC
Start: 2023-10-12 — End: 2023-11-26

## 2023-10-12 NOTE — Telephone Encounter (Signed)
 RX sent again by [facility] today, 4/29, per pharmacy request (see 4/26 encounter).

## 2023-10-12 NOTE — Telephone Encounter (Signed)
 Resend per pharmacy request:    "We did not receive RX since March. Please resend."

## 2023-10-12 NOTE — Telephone Encounter (Signed)
Due for BP follow up.  One refill authorized.  Please schedule follow up visit.

## 2023-11-02 ENCOUNTER — Other Ambulatory Visit (INDEPENDENT_AMBULATORY_CARE_PROVIDER_SITE_OTHER): Payer: Self-pay

## 2023-11-02 DIAGNOSIS — E785 Hyperlipidemia, unspecified: Secondary | ICD-10-CM

## 2023-11-03 MED ORDER — ATORVASTATIN CALCIUM 40 MG OR TABS
40.0000 mg | ORAL_TABLET | Freq: Every day | ORAL | 1 refills | Status: DC
Start: 2023-11-03 — End: 2023-11-26

## 2023-11-24 ENCOUNTER — Other Ambulatory Visit (INDEPENDENT_AMBULATORY_CARE_PROVIDER_SITE_OTHER): Payer: Self-pay

## 2023-11-24 DIAGNOSIS — Z794 Long term (current) use of insulin: Secondary | ICD-10-CM

## 2023-11-24 DIAGNOSIS — E785 Hyperlipidemia, unspecified: Secondary | ICD-10-CM

## 2023-11-24 DIAGNOSIS — N1832 Chronic kidney disease, stage 3b: Secondary | ICD-10-CM

## 2023-11-24 DIAGNOSIS — I10 Essential (primary) hypertension: Secondary | ICD-10-CM

## 2023-11-26 MED ORDER — NIFEDIPINE ER 30 MG OR TB24: 60.0000 mg | EXTENDED_RELEASE_TABLET | Freq: Two times a day (BID) | ORAL | 2 refills | Status: DC

## 2023-11-26 MED ORDER — EMPAGLIFLOZIN 25 MG OR TABS: 25.0000 mg | ORAL_TABLET | Freq: Every day | ORAL | 2 refills | Status: DC

## 2023-11-26 MED ORDER — METFORMIN HCL 1000 MG OR TABS: 1000.0000 mg | ORAL_TABLET | Freq: Two times a day (BID) | ORAL | 2 refills | Status: DC

## 2023-11-26 MED ORDER — METOPROLOL SUCCINATE ER 25 MG OR TB24: EXTENDED_RELEASE_TABLET | ORAL | 2 refills | Status: DC

## 2023-11-26 MED ORDER — ATORVASTATIN CALCIUM 40 MG OR TABS: 40.0000 mg | ORAL_TABLET | Freq: Every day | ORAL | 2 refills | Status: DC

## 2023-11-26 MED ORDER — LOSARTAN POTASSIUM 50 MG OR TABS: ORAL_TABLET | ORAL | 2 refills | Status: DC

## 2024-04-14 ENCOUNTER — Encounter (INDEPENDENT_AMBULATORY_CARE_PROVIDER_SITE_OTHER): Payer: Self-pay

## 2024-04-15 ENCOUNTER — Encounter (INDEPENDENT_AMBULATORY_CARE_PROVIDER_SITE_OTHER): Payer: Self-pay | Admitting: Family Medicine

## 2024-04-18 NOTE — Telephone Encounter (Signed)
 Patient is requesting a form for medical necessity be completed regarding his vitamin D draw.    Form printed and placed in providers sign folder.    Patient has no PCP listed although it appears he has been seeing you since 2024.  Are you willing to add him to your panel?    Routing to Dr. Cindy to review and advise.

## 2024-04-18 NOTE — Telephone Encounter (Signed)
 Routing to Dr. Shona who last saw patient (form placed in folder), thanks.

## 2024-05-20 ENCOUNTER — Encounter (INDEPENDENT_AMBULATORY_CARE_PROVIDER_SITE_OTHER): Payer: Self-pay

## 2024-05-23 NOTE — Telephone Encounter (Signed)
 Please see form patient attached.  We did advise he schedule an appointment with you, but is this something you would be able to complete for him?    Routed message to Dr. Shona

## 2024-05-24 ENCOUNTER — Encounter (HOSPITAL_BASED_OUTPATIENT_CLINIC_OR_DEPARTMENT_OTHER): Payer: Self-pay | Admitting: Nephrology

## 2024-07-10 ENCOUNTER — Encounter (INDEPENDENT_AMBULATORY_CARE_PROVIDER_SITE_OTHER): Payer: Self-pay | Admitting: Family Medicine Res/Flw

## 2024-07-10 ENCOUNTER — Ambulatory Visit (INDEPENDENT_AMBULATORY_CARE_PROVIDER_SITE_OTHER): Admitting: Family Medicine Res/Flw

## 2024-07-10 VITALS — BP 134/96 | HR 60 | Temp 98.5°F | Resp 16 | Wt 193.0 lb

## 2024-07-10 DIAGNOSIS — E785 Hyperlipidemia, unspecified: Secondary | ICD-10-CM

## 2024-07-10 DIAGNOSIS — R062 Wheezing: Secondary | ICD-10-CM

## 2024-07-10 DIAGNOSIS — N1832 Chronic kidney disease, stage 3b: Secondary | ICD-10-CM

## 2024-07-10 DIAGNOSIS — I10 Essential (primary) hypertension: Secondary | ICD-10-CM

## 2024-07-10 DIAGNOSIS — N529 Male erectile dysfunction, unspecified: Secondary | ICD-10-CM

## 2024-07-10 DIAGNOSIS — E7849 Other hyperlipidemia: Secondary | ICD-10-CM

## 2024-07-10 DIAGNOSIS — J302 Other seasonal allergic rhinitis: Secondary | ICD-10-CM

## 2024-07-10 MED ORDER — METOPROLOL SUCCINATE ER 25 MG OR TB24: EXTENDED_RELEASE_TABLET | ORAL | 3 refills | Status: AC

## 2024-07-10 MED ORDER — METFORMIN HCL 1000 MG OR TABS: 1000.0000 mg | ORAL_TABLET | Freq: Two times a day (BID) | ORAL | 3 refills | Status: AC

## 2024-07-10 MED ORDER — SILDENAFIL CITRATE 100 MG OR TABS: ORAL_TABLET | ORAL | 2 refills | Status: AC

## 2024-07-10 MED ORDER — LANCETS THIN MISC: 1.0000 | Freq: Three times a day (TID) | 1 refills | Status: AC

## 2024-07-10 MED ORDER — EMPAGLIFLOZIN 25 MG OR TABS: 25.0000 mg | ORAL_TABLET | Freq: Every day | ORAL | 3 refills | Status: AC

## 2024-07-10 MED ORDER — NIFEDIPINE ER 30 MG OR TB24: 60.0000 mg | EXTENDED_RELEASE_TABLET | Freq: Two times a day (BID) | ORAL | 2 refills | Status: AC

## 2024-07-10 MED ORDER — ALBUTEROL SULFATE HFA 108 (90 BASE) MCG/ACT IN AERS: 2.0000 | INHALATION_SPRAY | RESPIRATORY_TRACT | 5 refills | Status: AC | PRN

## 2024-07-10 MED ORDER — LOSARTAN POTASSIUM 50 MG OR TABS: ORAL_TABLET | ORAL | 3 refills | Status: AC

## 2024-07-10 MED ORDER — FLUTICASONE PROPIONATE 50 MCG/ACT NA SUSP: 2.0000 | Freq: Every day | NASAL | 2 refills | Status: AC

## 2024-07-10 MED ORDER — GLUCOSE BLOOD VI STRP: 1.0000 | ORAL_STRIP | Freq: Three times a day (TID) | 11 refills | Status: AC

## 2024-07-10 MED ORDER — ATORVASTATIN CALCIUM 40 MG OR TABS: 40.0000 mg | ORAL_TABLET | Freq: Every day | ORAL | 3 refills | Status: AC

## 2024-07-10 NOTE — Progress Notes (Unsigned)
{  [facility]  [phone] E&M codes are now billed on either MDM or total time. You can document only what is medically necessary. :999}   Chief Complaint   Patient presents with    Lab Test     Vitamin D    Kidney Disease    Diabetes          Subjective: {VT  Prob List  Medical Hx  Surgical Hx  Family Hx  Substance & Sexual Hx  Social Documentation  Obstetric Hx   Birth Hx :999}    Sean Oliver is a 64 year old male who presents on 07/10/2024.    ***  #Medication refill  Here today for refills on all of his medications  Reports taking all of his medications as prescribed        Objective: {VT  Vitals  Vitals Flowsheet  Labs  Imaging :999}   Vitals: BP (!) 134/96   Pulse 60   Temp 36.9 C (Temporal)   Resp 16   Wt 87.5 kg (193 lb)   SpO2 98%   BMI 24.78 kg/m   Physical Exam  ***  Physical Exam  Constitutional:       General: No acute distress.     Appearance: Not ill-appearing or toxic-appearing.   HENT:      Head: Normocephalic and atraumatic.   Eyes:      General: No scleral icterus.     Conjunctiva/sclera: Conjunctivae normal.   Cardiovascular:      Rate and Rhythm: Normal rate and regular rhythm.      Heart sounds: Normal heart sounds.   Pulmonary:      Effort: Pulmonary effort is normal.      Breath sounds: Normal breath sounds.   Abdominal:      Tenderness: There is no abdominal tenderness.   Musculoskeletal:      Right lower leg: No edema.      Left lower leg: No edema.   Skin:     General: Skin is warm and dry.   Neurological:      Mental Status: Alert and oriented to person, place, and time.   Psychiatric:         Mood and Affect: Mood normal.         Behavior: Behavior normal.         Assessment and Plan: {VT  Meds  HM  Care Gaps :999}   {VT  LOS Calculator  Job aid  AMA's MDM grid :999}  {Assessment & Eojw:[id number]}

## 2024-07-10 NOTE — Progress Notes (Unsigned)
 Rooming was completed 07/10/2024 by Darice Janalee Boots, CMA    This visit was completed : In Person    Was an Engineer, Technical Sales Used? No    Reason for Visit:   Chief Complaint   Patient presents with    Lab Test     Vitamin D    Kidney Disease    Diabetes     Vaccines Due?  Yes - Not completing today    Health Maintenance Due   Topic    Prostate Cancer Screening Education     Diabetes Eye Exam     Zoster Vaccine (1 of 2)    RSV Vaccine (1 - Risk 50-74 years 1-dose series)    Pneumococcal Vaccine: 50+ Years (2 of 2 - PCV)    Depression Screening (PHQ-2)     DTaP, Tdap and Td Vaccines (4 - Td or Tdap)    Skin Cancer Monitoring for High Risk Patients     Diabetes Kidney Health Evaluation     Colorectal Cancer Screening     Diabetes A1c     COVID-19 Vaccine (7 - 2025-26 season)    Diabetes Foot Exam        PCP Verified?  Yes, No primary care provider on file.      Sensory Foot Exam with Monofilament     Last exam date:   today    Sensation key: 0 (no sensation), 1+ (bent filament) 2+  (straight filament)        RIGHT FOOT  Great toe pad   2+          4th toe pad     2+                 1st metatarsal pad  2+       2nd metatarsal pad  2+  5th metatarsal pad     1+ or 2+    LEFT FOOT  6.  Great toe pad  2+  7.  4th toe pad  2+                     8.  1st metatarsal pad  2+          9.  2nd metatarsal pad  2+  10.  5th metatarsal pad   1+ or 2+    0-20 possible (the higher the number the less likely to develop foot ulcers).

## 2024-09-14 ENCOUNTER — Encounter (HOSPITAL_BASED_OUTPATIENT_CLINIC_OR_DEPARTMENT_OTHER): Admitting: Nephrology
